# Patient Record
Sex: Female | Born: 1965
Health system: Southern US, Community
[De-identification: ages and names within clinical notes are randomized; demographics above are authoritative.]

## PROBLEM LIST (undated history)

## (undated) DIAGNOSIS — I10 Essential (primary) hypertension: Secondary | ICD-10-CM

## (undated) DIAGNOSIS — R7303 Prediabetes: Secondary | ICD-10-CM

## (undated) DIAGNOSIS — K5792 Diverticulitis of intestine, part unspecified, without perforation or abscess without bleeding: Secondary | ICD-10-CM

## (undated) HISTORY — DX: Essential (primary) hypertension: I10

---

## 1986-07-23 HISTORY — PX: CHOLECYSTECTOMY: SHX55

## 1995-07-24 HISTORY — PX: HAND SURGERY: SHX662

## 2000-11-11 ENCOUNTER — Encounter: Payer: Self-pay | Admitting: Emergency Medicine

## 2000-11-11 ENCOUNTER — Emergency Department (HOSPITAL_COMMUNITY): Admission: EM | Admit: 2000-11-11 | Discharge: 2000-11-11 | Payer: Self-pay | Admitting: Emergency Medicine

## 2001-01-01 ENCOUNTER — Emergency Department (HOSPITAL_COMMUNITY): Admission: EM | Admit: 2001-01-01 | Discharge: 2001-01-01 | Payer: Self-pay | Admitting: *Deleted

## 2001-01-01 ENCOUNTER — Encounter: Payer: Self-pay | Admitting: *Deleted

## 2001-01-05 ENCOUNTER — Emergency Department (HOSPITAL_COMMUNITY): Admission: EM | Admit: 2001-01-05 | Discharge: 2001-01-05 | Payer: Self-pay | Admitting: *Deleted

## 2001-02-02 ENCOUNTER — Emergency Department (HOSPITAL_COMMUNITY): Admission: EM | Admit: 2001-02-02 | Discharge: 2001-02-02 | Payer: Self-pay | Admitting: Emergency Medicine

## 2001-03-27 ENCOUNTER — Encounter: Payer: Self-pay | Admitting: Emergency Medicine

## 2001-03-27 ENCOUNTER — Emergency Department (HOSPITAL_COMMUNITY): Admission: EM | Admit: 2001-03-27 | Discharge: 2001-03-27 | Payer: Self-pay | Admitting: Emergency Medicine

## 2001-05-01 ENCOUNTER — Emergency Department (HOSPITAL_COMMUNITY): Admission: EM | Admit: 2001-05-01 | Discharge: 2001-05-01 | Payer: Self-pay | Admitting: Emergency Medicine

## 2001-05-02 ENCOUNTER — Encounter: Payer: Self-pay | Admitting: Emergency Medicine

## 2001-05-02 ENCOUNTER — Emergency Department (HOSPITAL_COMMUNITY): Admission: EM | Admit: 2001-05-02 | Discharge: 2001-05-02 | Payer: Self-pay | Admitting: Emergency Medicine

## 2001-06-18 ENCOUNTER — Other Ambulatory Visit: Admission: RE | Admit: 2001-06-18 | Discharge: 2001-06-18 | Payer: Self-pay | Admitting: Obstetrics and Gynecology

## 2001-06-20 ENCOUNTER — Ambulatory Visit (HOSPITAL_COMMUNITY): Admission: RE | Admit: 2001-06-20 | Discharge: 2001-06-20 | Payer: Self-pay | Admitting: Family Medicine

## 2001-06-20 ENCOUNTER — Encounter: Payer: Self-pay | Admitting: Family Medicine

## 2001-06-27 ENCOUNTER — Emergency Department (HOSPITAL_COMMUNITY): Admission: EM | Admit: 2001-06-27 | Discharge: 2001-06-27 | Payer: Self-pay | Admitting: Emergency Medicine

## 2001-08-30 ENCOUNTER — Emergency Department (HOSPITAL_COMMUNITY): Admission: EM | Admit: 2001-08-30 | Discharge: 2001-08-30 | Payer: Self-pay | Admitting: Internal Medicine

## 2001-12-09 ENCOUNTER — Emergency Department (HOSPITAL_COMMUNITY): Admission: EM | Admit: 2001-12-09 | Discharge: 2001-12-09 | Payer: Self-pay | Admitting: Emergency Medicine

## 2002-07-02 ENCOUNTER — Emergency Department (HOSPITAL_COMMUNITY): Admission: EM | Admit: 2002-07-02 | Discharge: 2002-07-02 | Payer: Self-pay | Admitting: Internal Medicine

## 2002-07-17 ENCOUNTER — Emergency Department (HOSPITAL_COMMUNITY): Admission: EM | Admit: 2002-07-17 | Discharge: 2002-07-17 | Payer: Self-pay | Admitting: Emergency Medicine

## 2002-07-17 ENCOUNTER — Encounter: Payer: Self-pay | Admitting: Emergency Medicine

## 2002-11-30 ENCOUNTER — Emergency Department (HOSPITAL_COMMUNITY): Admission: EM | Admit: 2002-11-30 | Discharge: 2002-12-01 | Payer: Self-pay | Admitting: *Deleted

## 2003-11-10 ENCOUNTER — Emergency Department (HOSPITAL_COMMUNITY): Admission: EM | Admit: 2003-11-10 | Discharge: 2003-11-10 | Payer: Self-pay | Admitting: Emergency Medicine

## 2006-02-11 ENCOUNTER — Emergency Department (HOSPITAL_COMMUNITY): Admission: EM | Admit: 2006-02-11 | Discharge: 2006-02-12 | Payer: Self-pay | Admitting: Emergency Medicine

## 2007-12-06 ENCOUNTER — Emergency Department (HOSPITAL_COMMUNITY): Admission: EM | Admit: 2007-12-06 | Discharge: 2007-12-06 | Payer: Self-pay | Admitting: Emergency Medicine

## 2007-12-19 ENCOUNTER — Ambulatory Visit (HOSPITAL_COMMUNITY): Admission: RE | Admit: 2007-12-19 | Discharge: 2007-12-19 | Payer: Self-pay | Admitting: Obstetrics and Gynecology

## 2009-05-18 ENCOUNTER — Emergency Department (HOSPITAL_COMMUNITY): Admission: EM | Admit: 2009-05-18 | Discharge: 2009-05-18 | Payer: Self-pay | Admitting: Emergency Medicine

## 2009-12-22 ENCOUNTER — Emergency Department (HOSPITAL_COMMUNITY): Admission: EM | Admit: 2009-12-22 | Discharge: 2009-12-22 | Payer: Self-pay | Admitting: Emergency Medicine

## 2010-10-26 LAB — URINE MICROSCOPIC-ADD ON

## 2010-10-26 LAB — URINALYSIS, ROUTINE W REFLEX MICROSCOPIC
Bilirubin Urine: NEGATIVE
Ketones, ur: NEGATIVE mg/dL
Protein, ur: NEGATIVE mg/dL
Specific Gravity, Urine: 1.015 (ref 1.005–1.030)
Urobilinogen, UA: 0.2 mg/dL (ref 0.0–1.0)
pH: 5.5 (ref 5.0–8.0)

## 2010-10-26 LAB — PREGNANCY, URINE: Preg Test, Ur: NEGATIVE

## 2010-11-20 ENCOUNTER — Emergency Department (HOSPITAL_COMMUNITY): Payer: BC Managed Care – PPO

## 2010-11-20 ENCOUNTER — Emergency Department (HOSPITAL_COMMUNITY)
Admission: EM | Admit: 2010-11-20 | Discharge: 2010-11-20 | Disposition: A | Discharge status: Home / Self Care | Payer: BC Managed Care – PPO | Attending: Emergency Medicine | Admitting: Emergency Medicine

## 2010-11-20 DIAGNOSIS — N898 Other specified noninflammatory disorders of vagina: Secondary | ICD-10-CM | POA: Insufficient documentation

## 2010-11-20 LAB — URINE MICROSCOPIC-ADD ON

## 2010-11-20 LAB — URINALYSIS, ROUTINE W REFLEX MICROSCOPIC
Ketones, ur: NEGATIVE mg/dL
Nitrite: NEGATIVE
Protein, ur: NEGATIVE mg/dL
Specific Gravity, Urine: 1.025 (ref 1.005–1.030)

## 2010-11-20 LAB — WET PREP, GENITAL
Trich, Wet Prep: NONE SEEN
Yeast Wet Prep HPF POC: NONE SEEN

## 2010-11-20 LAB — RPR: RPR Ser Ql: NONREACTIVE

## 2010-11-21 LAB — GC/CHLAMYDIA PROBE AMP, GENITAL
Chlamydia, DNA Probe: NEGATIVE
GC Probe Amp, Genital: NEGATIVE

## 2011-04-18 LAB — DIFFERENTIAL
Basophils Absolute: 0
Basophils Relative: 0
Eosinophils Relative: 1
Lymphocytes Relative: 21
Lymphs Abs: 1.7

## 2011-04-18 LAB — CBC
HCT: 31.8 — ABNORMAL LOW
MCHC: 33.1
MCV: 67 — ABNORMAL LOW
Platelets: 268
RBC: 4.75
WBC: 7.8

## 2011-04-18 LAB — PREGNANCY, URINE: Preg Test, Ur: NEGATIVE

## 2012-07-24 ENCOUNTER — Emergency Department (HOSPITAL_COMMUNITY)
Admission: EM | Admit: 2012-07-24 | Discharge: 2012-07-25 | Disposition: A | Discharge status: Home / Self Care | Payer: Self-pay | Attending: Emergency Medicine | Admitting: Emergency Medicine

## 2012-07-24 ENCOUNTER — Encounter (HOSPITAL_COMMUNITY): Payer: Self-pay

## 2012-07-24 DIAGNOSIS — K612 Anorectal abscess: Secondary | ICD-10-CM | POA: Insufficient documentation

## 2012-07-24 DIAGNOSIS — Z9089 Acquired absence of other organs: Secondary | ICD-10-CM | POA: Insufficient documentation

## 2012-07-24 DIAGNOSIS — L0291 Cutaneous abscess, unspecified: Secondary | ICD-10-CM

## 2012-07-24 DIAGNOSIS — F17200 Nicotine dependence, unspecified, uncomplicated: Secondary | ICD-10-CM | POA: Insufficient documentation

## 2012-07-24 DIAGNOSIS — L0390 Cellulitis, unspecified: Secondary | ICD-10-CM

## 2012-07-24 MED ORDER — OXYCODONE-ACETAMINOPHEN 5-325 MG PO TABS: 2.0000 | ORAL_TABLET | ORAL | Status: DC | PRN

## 2012-07-24 MED ORDER — SULFAMETHOXAZOLE-TRIMETHOPRIM 800-160 MG PO TABS: 1.0000 | ORAL_TABLET | Freq: Two times a day (BID) | ORAL | Status: DC

## 2012-07-24 MED ORDER — LIDOCAINE HCL 2 % IJ SOLN
10.0000 mL | Freq: Once | INTRAMUSCULAR | Status: AC
  Administered 2012-07-24: 200 mg via INTRADERMAL

## 2012-07-24 NOTE — ED Notes (Signed)
Pt A.O. X 4. Respirations even and regular. NAD. Verbalized understanding of diagnosis. Verbalized need to have packing removed in 2-3 days by PCP or return to ED. Verbalized understanding of medications and need to complete full dose of antibiotic. No further questions at this time. Ambulatory with no assist. Family at bedside.

## 2012-07-24 NOTE — ED Provider Notes (Addendum)
History     CSN: [id number]  Arrival date & time 07/24/12  2021   First MD Initiated Contact with Patient 07/24/12 2105      Chief Complaint  Patient presents with  . Abscess    (Consider location/radiation/quality/duration/timing/severity/associated sxs/prior treatment) Patient is a 47 y.o. female presenting with abscess. The history is provided by the patient and a friend. No language interpreter was used.  Abscess  This is a new problem. The current episode started less than one week ago. The onset was gradual. The problem occurs rarely. The problem has been gradually worsening. Affected Location: R perirectal. The problem is moderate. The abscess is characterized by redness, painfulness and swelling. Pertinent negatives include no fever, no vomiting and no sore throat.  47yo female with c/o boil on her R buttocks x 2 days.  Pain with standing and sitting.  Nothing makes the pan better.  Denies fever, nausea and vomiting.   History reviewed. No pertinent past medical history.  Past Surgical History  Procedure Date  . Cholecystectomy   . Hand surgery     History reviewed. No pertinent family history.  History  Substance Use Topics  . Smoking status: Current Some Day Smoker  . Smokeless tobacco: Not on file  . Alcohol Use: No    OB History    Grav Para Term Preterm Abortions TAB SAB Ect Mult Living                  Review of Systems  Constitutional: Negative for fever and diaphoresis.  HENT: Negative for sore throat.   Respiratory: Negative for shortness of breath.   Gastrointestinal: Negative for nausea and vomiting.  Skin:       Abscess to R buttocks  Neurological: Negative for dizziness, weakness and light-headedness.    Allergies  Ceftin  Home Medications   Current Outpatient Rx  Name  Route  Sig  Dispense  Refill  . IBUPROFEN 800 MG PO TABS   Oral   Take 800 mg by mouth once as needed. For pain           BP 126/69  Pulse 102  Temp 98.5 F  (36.9 C) (Oral)  Resp 16  SpO2 100%  LMP 07/14/2012  Physical Exam  Nursing note and vitals reviewed. Constitutional: She is oriented to person, place, and time. She appears well-developed and well-nourished.  HENT:  Head: Normocephalic and atraumatic.  Eyes: Conjunctivae and EOM are normal. Pupils are equal, round, and reactive to light.  Neck: Normal range of motion. Neck supple.  Cardiovascular: Normal rate.   Pulmonary/Chest: Effort normal.  Abdominal: Soft.  Musculoskeletal: Normal range of motion. She exhibits no edema and no tenderness.  Neurological: She is alert and oriented to person, place, and time. She has normal reflexes.  Skin: Skin is warm and dry.  Abscess to buttocks  Psychiatric: She has a normal mood and affect.    ED Course  INCISION AND DRAINAGE Date/Time: 07/24/2012 11:20 AM Performed by: Remi Haggard Authorized by: Remi Haggard Consent: Verbal consent obtained. Risks and benefits: risks, benefits and alternatives were discussed Consent given by: patient Patient understanding: patient states understanding of the procedure being performed Patient identity confirmed: verbally with patient, arm band, provided demographic data and hospital-assigned identification number Time out: Immediately prior to procedure a "time out" was called to verify the correct patient, procedure, equipment, support staff and site/side marked as required. Type: abscess Body area: trunk (peri rectal) Anesthesia: local infiltration Local anesthetic: lidocaine  2% without epinephrine Patient sedated: no Scalpel size: 11 Needle gauge: 20 Incision type: single straight Complexity: simple Drainage: purulent Drainage amount: moderate Wound treatment: wound left open Packing material: 1/2 in gauze Patient tolerance: Patient tolerated the procedure well with no immediate complications.   (including critical care time)  Labs Reviewed - No data to display No results  found.   No diagnosis found.    MDM  Abscess /cellulitis to peri rectal area I & D in the ER with packing.  Patient with return in 2 days for packing removal.  rx for bactim.  She understands to return sooner for worsening symptoms.  Ready for discharge.         Remi Haggard, NP 07/25/12 2952  Remi Haggard, NP 08/07/12 1356  Remi Haggard, NP 09/11/12 1136

## 2012-07-24 NOTE — ED Notes (Signed)
I&D Kit placed at bedside and lidocaine given to Remi Haggard, NP per request.

## 2012-07-24 NOTE — ED Notes (Signed)
Pt states noticed abscess the day before yesterday and she put warm cloth on it and states abscess increased in size after applying cloth; pt c/o pain when sitting; pt denies nausea/vomiting. Pt c/o chills. Pt denies fever. Pt mentating appropriately.

## 2012-07-24 NOTE — ED Notes (Signed)
Christina Haggard, NP at bedside with pt and I&D Kit

## 2012-07-24 NOTE — ED Notes (Signed)
Pt reports a "boil" to her buttocks, pain w/walking or sitting

## 2012-07-25 NOTE — ED Provider Notes (Signed)
Medical screening examination/treatment/procedure(s) were performed by non-physician practitioner and as supervising physician I was immediately available for consultation/collaboration.   Dione Booze, MD 07/25/12 [phone]

## 2012-09-17 ENCOUNTER — Telehealth (HOSPITAL_COMMUNITY): Payer: Self-pay | Admitting: Emergency Medicine

## 2012-09-19 ENCOUNTER — Telehealth (HOSPITAL_COMMUNITY): Payer: Self-pay | Admitting: Emergency Medicine

## 2015-08-02 ENCOUNTER — Other Ambulatory Visit: Payer: Self-pay | Admitting: Obstetrics and Gynecology

## 2015-08-10 ENCOUNTER — Ambulatory Visit (INDEPENDENT_AMBULATORY_CARE_PROVIDER_SITE_OTHER): Payer: BLUE CROSS/BLUE SHIELD | Admitting: Obstetrics and Gynecology

## 2015-08-10 ENCOUNTER — Other Ambulatory Visit (HOSPITAL_COMMUNITY)
Admission: RE | Admit: 2015-08-10 | Discharge: 2015-08-10 | Disposition: A | Discharge status: Home / Self Care | Payer: BLUE CROSS/BLUE SHIELD | Source: Ambulatory Visit | Attending: Obstetrics and Gynecology | Admitting: Obstetrics and Gynecology

## 2015-08-10 ENCOUNTER — Encounter: Payer: Self-pay | Admitting: Obstetrics and Gynecology

## 2015-08-10 VITALS — BP 118/76 | Ht 67.0 in | Wt 210.0 lb

## 2015-08-10 DIAGNOSIS — N9089 Other specified noninflammatory disorders of vulva and perineum: Secondary | ICD-10-CM | POA: Insufficient documentation

## 2015-08-10 DIAGNOSIS — Z01419 Encounter for gynecological examination (general) (routine) without abnormal findings: Secondary | ICD-10-CM | POA: Diagnosis not present

## 2015-08-10 DIAGNOSIS — Z0000 Encounter for general adult medical examination without abnormal findings: Secondary | ICD-10-CM | POA: Insufficient documentation

## 2015-08-10 DIAGNOSIS — Z1151 Encounter for screening for human papillomavirus (HPV): Secondary | ICD-10-CM | POA: Diagnosis not present

## 2015-08-10 DIAGNOSIS — Z113 Encounter for screening for infections with a predominantly sexual mode of transmission: Secondary | ICD-10-CM | POA: Diagnosis present

## 2015-08-10 DIAGNOSIS — N390 Urinary tract infection, site not specified: Secondary | ICD-10-CM | POA: Insufficient documentation

## 2015-08-10 MED ORDER — SOLIFENACIN SUCCINATE 5 MG PO TABS: 5.0000 mg | ORAL_TABLET | Freq: Every day | ORAL | Status: DC

## 2015-08-10 MED ORDER — SULFAMETHOXAZOLE-TRIMETHOPRIM 800-160 MG PO TABS: 1.0000 | ORAL_TABLET | Freq: Two times a day (BID) | ORAL | Status: DC

## 2015-08-10 MED ORDER — SULFAMETHOXAZOLE-TRIMETHOPRIM 400-80 MG PO TABS
1.0000 | ORAL_TABLET | Freq: Two times a day (BID) | ORAL | Status: DC
Start: 2015-08-10 — End: 2015-08-30

## 2015-08-10 NOTE — Progress Notes (Signed)
Patient ID: Christina Cannon, female   DOB: 03-05-1966, 50 y.o.   MRN: [id number]  Assessment:  Annual Gyn Exam , first in 10+ yrs. VULVAR acrochordon, right labia majora. Urinary urgency Multiple skin tags neck and chest Plan:  1. pap smear done, next pap due q 68yr 2. return 1 month for removal skin tag. or prn 3    Annual mammogram advised Trial vesicare.5 mg q d Subjective:  Christina Cannon is a 50 y.o. female No obstetric history on file. who presents for annual exam. Patient's last menstrual period was 07/20/2015. last pap OVER 10 YEARS. The patient has complaints today of as above, per Christina Cannon. See her note  The following portions of the patient's history were reviewed and updated as appropriate: allergies, current medications, past family history, past medical history, past social history, past surgical history and problem list. History reviewed. No pertinent past medical history.  Past Surgical History  Procedure Laterality Date  . Cholecystectomy    . Hand surgery      No current outpatient prescriptions on file.  Review of Systems Constitutional: negative Gastrointestinal: negative Genitourinary: urinary urgency  Objective:  BP 118/76 mmHg  Ht 5\' 7"  (1.702 m)  Wt 210 lb (95.255 kg)  BMI 32.88 kg/m2  LMP 07/20/2015   BMI: Body mass index is 32.88 kg/(m^2).  General Appearance: Alert, appropriate appearance for age. No acute distress HEENT: Grossly normal Neck / Thyroid:  Cardiovascular: RRR; normal S1, S2, no murmur Lungs: CTA bilaterally Back: No CVAT Breast Exam: No masses or nodes.No dimpling, nipple retraction or discharge. Gastrointestinal: Soft, non-tender, no masses or organomegaly Pelvic Exam: Vulva and vagina appear normal. Bimanual exam reveals normal uterus and adnexa. External genitalia: normal general appearance and skin tag right labia, 1 cm x 8 mm on stalk Urinary system: urethral meatus normal Vaginal: normal mucosa without prolapse  or lesions Cervix: normal appearance Adnexa: normal bimanual exam Uterus: mid-position, enlarged and irregular enlargement Rectal: good sphincter tone, no masses and guaiac negative Clinical staff offered to be present for exam: yes  Initials: amt Rectovaginal: normal rectal, no masses Lymphatic Exam: Non-palpable nodes in neck, clavicular, axillary, or inguinal regions Skin: no rash or abnormalities Neurologic: Normal gait and speech, no tremor  Psychiatric: Alert and oriented, appropriate affect.  Urinalysis:normal  Christina Cannon. MD Pgr [phone] 3:23 PM

## 2015-08-10 NOTE — Addendum Note (Signed)
Addended by: Farley Ly on: 08/10/2015 03:56 PM   Modules accepted: Orders

## 2015-08-10 NOTE — Addendum Note (Signed)
Addended by: [name] on: 08/10/2015 04:01 PM   Modules accepted: Orders

## 2015-08-10 NOTE — Progress Notes (Signed)
Patient ID: Christina Cannon, female   DOB: 04-22-1966, 50 y.o.   MRN: [id number] Pt here today for annual exam. Pt states that she has a skin tag on her " lip" that keeps catching on her clothes, lower abdominal pain , pain with sex at times, multiple periods each month.

## 2015-08-12 LAB — URINE CULTURE

## 2015-08-12 LAB — CYTOLOGY - PAP

## 2015-08-29 ENCOUNTER — Telehealth: Payer: Self-pay | Admitting: *Deleted

## 2015-08-29 NOTE — Telephone Encounter (Signed)
-----   Message from [name], MD sent at 08/28/2015  7:50 AM EST ----- Uti, will rx for septra ds x 7 days, and have pt called

## 2015-08-29 NOTE — Telephone Encounter (Signed)
Pt aware of antibiotic and UTI, pt stated that she has been taking the medication.

## 2015-08-30 ENCOUNTER — Encounter: Payer: Self-pay | Admitting: Obstetrics and Gynecology

## 2015-08-30 ENCOUNTER — Ambulatory Visit (INDEPENDENT_AMBULATORY_CARE_PROVIDER_SITE_OTHER): Payer: BLUE CROSS/BLUE SHIELD | Admitting: Obstetrics and Gynecology

## 2015-08-30 VITALS — BP 120/76 | Ht 67.0 in | Wt 207.0 lb

## 2015-08-30 DIAGNOSIS — L918 Other hypertrophic disorders of the skin: Secondary | ICD-10-CM

## 2015-08-30 DIAGNOSIS — N907 Vulvar cyst: Secondary | ICD-10-CM | POA: Diagnosis not present

## 2015-08-30 DIAGNOSIS — N9089 Other specified noninflammatory disorders of vulva and perineum: Secondary | ICD-10-CM

## 2015-08-30 NOTE — Progress Notes (Signed)
Patient ID: KEELIA CASTLEBERRY, female   DOB: 1966-01-23, 50 y.o.   MRN: [id number] SKIN TAG REMOVAL PROCEDURE NOTE The patient's identification was confirmed and consent was obtained. This procedure was performed by [name] at 3:48 PM Site & Appearance: right labia majora Sterile procedures observed yes Anesthetic used: local lidocaine AgNO3 applied  yes Skin tag excised under local anesthesia, site covered with dry, sterile dressing.  Patient tolerated procedure well without complications. Minimal blood loss. Instructions for care discussed verbally  additional written instructions for homecare and f/u.  SKIN TAG REMOVAL PROCEDURE NOTE The patient's identification was confirmed and consent was obtained. This procedure was performed by [name] at 3:48 PM Site & Appearance: neck, multiple, 5 removed Sterile procedures observed yes Anesthetic used: local 1% lidocaine AgNO3 applied no Skin tag excised under local anesthesia, site covered with dry, sterile dressing.  Patient tolerated procedure well without complications. Minimal blood loss. Instructions for care discussed verbally. additional written instructions for homecare and f/u.

## 2015-08-30 NOTE — Progress Notes (Signed)
Patient ID: Christina Cannon, female   DOB: [birth year], 50 y.o.   MRN: [id number] Pt here today for skin tag removal.

## 2015-09-19 ENCOUNTER — Emergency Department (HOSPITAL_COMMUNITY)
Admission: EM | Admit: 2015-09-19 | Discharge: 2015-09-19 | Disposition: A | Discharge status: Home / Self Care | Payer: BLUE CROSS/BLUE SHIELD | Attending: Emergency Medicine | Admitting: Emergency Medicine

## 2015-09-19 ENCOUNTER — Emergency Department (HOSPITAL_COMMUNITY): Payer: BLUE CROSS/BLUE SHIELD

## 2015-09-19 ENCOUNTER — Encounter (HOSPITAL_COMMUNITY): Payer: Self-pay | Admitting: *Deleted

## 2015-09-19 DIAGNOSIS — R200 Anesthesia of skin: Secondary | ICD-10-CM | POA: Diagnosis not present

## 2015-09-19 DIAGNOSIS — F17200 Nicotine dependence, unspecified, uncomplicated: Secondary | ICD-10-CM | POA: Insufficient documentation

## 2015-09-19 DIAGNOSIS — F1210 Cannabis abuse, uncomplicated: Secondary | ICD-10-CM | POA: Insufficient documentation

## 2015-09-19 DIAGNOSIS — R531 Weakness: Secondary | ICD-10-CM | POA: Insufficient documentation

## 2015-09-19 DIAGNOSIS — Z79899 Other long term (current) drug therapy: Secondary | ICD-10-CM | POA: Insufficient documentation

## 2015-09-19 DIAGNOSIS — M5412 Radiculopathy, cervical region: Secondary | ICD-10-CM | POA: Diagnosis not present

## 2015-09-19 DIAGNOSIS — M79601 Pain in right arm: Secondary | ICD-10-CM | POA: Diagnosis not present

## 2015-09-19 DIAGNOSIS — M542 Cervicalgia: Secondary | ICD-10-CM | POA: Diagnosis present

## 2015-09-19 LAB — COMPREHENSIVE METABOLIC PANEL
ALBUMIN: 3.1 g/dL — AB (ref 3.5–5.0)
ALK PHOS: 46 U/L (ref 38–126)
ALT: 9 U/L — AB (ref 14–54)
AST: 16 U/L (ref 15–41)
Anion gap: 9 (ref 5–15)
BUN: 7 mg/dL (ref 6–20)
CHLORIDE: 109 mmol/L (ref 101–111)
CO2: 21 mmol/L — AB (ref 22–32)
Calcium: 8.5 mg/dL — ABNORMAL LOW (ref 8.9–10.3)
Creatinine, Ser: 0.88 mg/dL (ref 0.44–1.00)
GFR calc non Af Amer: >60 mL/min (ref >60)
Glucose, Bld: 89 mg/dL (ref 65–99)
POTASSIUM: 3.8 mmol/L (ref 3.5–5.1)
SODIUM: 139 mmol/L (ref 135–145)
Total Bilirubin: <0.1 mg/dL — ABNORMAL LOW (ref 0.3–1.2)
Total Protein: 6.3 g/dL — ABNORMAL LOW (ref 6.5–8.1)

## 2015-09-19 LAB — RAPID URINE DRUG SCREEN, HOSP PERFORMED
AMPHETAMINES: NOT DETECTED
BENZODIAZEPINES: NOT DETECTED
Barbiturates: NOT DETECTED
COCAINE: NOT DETECTED
OPIATES: NOT DETECTED
Tetrahydrocannabinol: POSITIVE — AB

## 2015-09-19 LAB — DIFFERENTIAL
Basophils Absolute: 0 10*3/uL (ref 0.0–0.1)
Basophils Relative: 0 %
Eosinophils Absolute: 0.1 10*3/uL (ref 0.0–0.7)
Eosinophils Relative: 2 %
Lymphocytes Relative: 22 %
Lymphs Abs: 1.8 10*3/uL (ref 0.7–4.0)
MONO ABS: 0.4 10*3/uL (ref 0.1–1.0)
Monocytes Relative: 6 %
Neutro Abs: 5.5 10*3/uL (ref 1.7–7.7)
Neutrophils Relative %: 70 %

## 2015-09-19 LAB — URINALYSIS, ROUTINE W REFLEX MICROSCOPIC
BILIRUBIN URINE: NEGATIVE
Glucose, UA: NEGATIVE mg/dL
Ketones, ur: NEGATIVE mg/dL
Leukocytes, UA: NEGATIVE
Nitrite: NEGATIVE
PH: 5.5 (ref 5.0–8.0)
Protein, ur: NEGATIVE mg/dL
SPECIFIC GRAVITY, URINE: 1.006 (ref 1.005–1.030)

## 2015-09-19 LAB — CBC
HEMATOCRIT: 32.6 % — AB (ref 36.0–46.0)
Hemoglobin: 10.3 g/dL — ABNORMAL LOW (ref 12.0–15.0)
MCH: 23.1 pg — ABNORMAL LOW (ref 26.0–34.0)
MCHC: 31.6 g/dL (ref 30.0–36.0)
MCV: 73.1 fL — AB (ref 78.0–100.0)
PLATELETS: 236 10*3/uL (ref 150–400)
RBC: 4.46 MIL/uL (ref 3.87–5.11)
RDW: 17.3 % — ABNORMAL HIGH (ref 11.5–15.5)
WBC: 7.8 10*3/uL (ref 4.0–10.5)

## 2015-09-19 LAB — I-STAT CHEM 8, ED
BUN: 6 mg/dL (ref 6–20)
CALCIUM ION: 1.1 mmol/L — AB (ref 1.12–1.23)
CHLORIDE: 111 mmol/L (ref 101–111)
CREATININE: 0.9 mg/dL (ref 0.44–1.00)
GLUCOSE: 87 mg/dL (ref 65–99)
HCT: 34 % — ABNORMAL LOW (ref 36.0–46.0)
Hemoglobin: 11.6 g/dL — ABNORMAL LOW (ref 12.0–15.0)
Potassium: 3.8 mmol/L (ref 3.5–5.1)
Sodium: 141 mmol/L (ref 135–145)
TCO2: 21 mmol/L (ref 0–100)

## 2015-09-19 LAB — APTT: aPTT: 28 seconds (ref 24–37)

## 2015-09-19 LAB — PROTIME-INR
INR: 1.04 (ref 0.00–1.49)
PROTHROMBIN TIME: 13.8 s (ref 11.6–15.2)

## 2015-09-19 LAB — URINE MICROSCOPIC-ADD ON

## 2015-09-19 LAB — I-STAT TROPONIN, ED: TROPONIN I, POC: 0 ng/mL (ref 0.00–0.08)

## 2015-09-19 LAB — ETHANOL: Alcohol, Ethyl (B): <5 mg/dL (ref <5)

## 2015-09-19 MED ORDER — LORAZEPAM 2 MG/ML IJ SOLN
1.0000 mg | Freq: Once | INTRAMUSCULAR | Status: AC
  Administered 2015-09-19: 1 mg via INTRAVENOUS
  Filled 2015-09-19: qty 1

## 2015-09-19 MED ORDER — TRAMADOL HCL 50 MG PO TABS: 50.0000 mg | ORAL_TABLET | Freq: Four times a day (QID) | ORAL | Status: DC | PRN

## 2015-09-19 NOTE — ED Notes (Signed)
Patient transported to CT 

## 2015-09-19 NOTE — ED Provider Notes (Signed)
CSN: [id number]     Arrival date & time 09/19/15  [id number] History  By signing my name below, I, Helane Gunther, attest that this documentation has been prepared under the direction and in the presence of Orpah Greek, MD. Electronically Signed: Helane Gunther, ED Scribe. 09/19/2015. 3:49 AM.     Chief Complaint  Patient presents with  . Neck Pain  . Arm Pain   The history is provided by the patient. No language interpreter was used.  Marland Kitchen HPI Comments: Christina Cannon is a 50 y.o. female who presents to the Emergency Department complaining of constant, worsening right-sided neck and arm pain onset 2 days ago. Pt states she woke up with numbness to the entire right side 2 mornings ago. She reports associated right-sided weakness and stiffness, stating her right leg even gave out on heart one point. She notes exacerbation of the pain with movement. She reports a PMHx of hand surgery from which she still has screws in her hand.    History reviewed. No pertinent past medical history. Past Surgical History  Procedure Laterality Date  . Cholecystectomy    . Hand surgery     Family History  Problem Relation Age of Onset  . Diabetes Mother   . Hypertension Mother   . Diabetes Father   . Hypertension Father   . Heart failure Father    Social History  Substance Use Topics  . Smoking status: Current Some Day Smoker  . Smokeless tobacco: Never Used  . Alcohol Use: No   OB History    No data available     Review of Systems  Musculoskeletal: Positive for myalgias, neck pain and neck stiffness.  Neurological: Positive for weakness and numbness.  All other systems reviewed and are negative.   Allergies  Ceftin  Home Medications   Prior to Admission medications   Medication Sig Start Date End Date Taking? Authorizing Provider  solifenacin (VESICARE) 5 MG tablet Take 1 tablet (5 mg total) by mouth daily. 08/10/15   [name], MD   BP 134/76 mmHg  Pulse 76  Temp(Src)  97.6 F (36.4 C) (Oral)  Resp 16  SpO2 96% Physical Exam  Constitutional: She is oriented to person, place, and time. She appears well-developed and well-nourished. No distress.  HENT:  Head: Normocephalic and atraumatic.  Right Ear: Hearing normal.  Left Ear: Hearing normal.  Nose: Nose normal.  Mouth/Throat: Oropharynx is clear and moist and mucous membranes are normal.  Eyes: Conjunctivae and EOM are normal. Pupils are equal, round, and reactive to light.  Neck: Normal range of motion. Neck supple.  Cardiovascular: Regular rhythm, S1 normal and S2 normal.  Exam reveals no gallop and no friction rub.   No murmur heard. Pulmonary/Chest: Effort normal and breath sounds normal. No respiratory distress. She exhibits no tenderness.  Abdominal: Soft. Normal appearance and bowel sounds are normal. There is no hepatosplenomegaly. There is no tenderness. There is no rebound, no guarding, no tenderness at McBurney's point and negative Murphy's sign. No hernia.  Musculoskeletal: Normal range of motion.  Neurological: She is alert and oriented to person, place, and time. She has normal strength. No cranial nerve deficit or sensory deficit. Coordination normal. GCS eye subscore is 4. GCS verbal subscore is 5. GCS motor subscore is 6.  Skin: Skin is warm, dry and intact. No rash noted. No cyanosis.  Psychiatric: She has a normal mood and affect. Her speech is normal and behavior is normal. Thought content normal.  Nursing note and vitals reviewed.   ED Course  Procedures  DIAGNOSTIC STUDIES: Oxygen Saturation is 96% on RA, normal by my interpretation.    COORDINATION OF CARE: 3:45 AM - Discussed plans to order diagnostic studies and imaging to r/o stroke. Pt advised of plan for treatment and pt agrees.  Labs Review Labs Reviewed - No data to display  Imaging Review No results found. I have personally reviewed and evaluated these images and lab results as part of my medical  decision-making.   EKG Interpretation None      MDM   Final diagnoses:  None  Right-sided weakness  Patient presents to the ER for evaluation of right-sided weakness that has been present for greater than 24 hours. She reports that she woke up yesterday morning with numbness and weakness of her right arm and right leg. Examination did reveal some decreased grip strength and movement against gravity of the right side. This was concerning for possible subacute CVA, however, she also has pain in her right arm which is causing painful inhibition of movements.  Etiology of her symptoms is unclear at this time. Screening CT does not show any acute abnormality. Patient will undergo MRI to evaluate for the possibility of CVA. Case will be signed out to oncoming ER physician. If MRI shows evidence of stroke, will require hospitalization for treatment and workup. If MRI of brain is negative, symptoms likely secondary to cervical radiculopathy and musculoskeletal pain and would be appropriate for discharge.  I personally performed the services described in this documentation, which was scribed in my presence. The recorded information has been reviewed and is accurate.    Orpah Greek, MD 09/19/15 [phone]

## 2015-09-19 NOTE — ED Notes (Signed)
Patient transported to MRI 

## 2015-09-19 NOTE — Discharge Instructions (Signed)

## 2015-09-19 NOTE — ED Notes (Signed)
To ED for eval of right side stiffness and pain. Pain increases with movement. Started Friday.

## 2015-09-22 ENCOUNTER — Telehealth: Payer: Self-pay | Admitting: Neurology

## 2015-09-22 ENCOUNTER — Ambulatory Visit: Payer: BLUE CROSS/BLUE SHIELD | Admitting: Neurology

## 2015-09-22 NOTE — Telephone Encounter (Signed)
The patient came in for the appointment today, but left without being seen.

## 2015-09-24 ENCOUNTER — Encounter (HOSPITAL_COMMUNITY): Payer: Self-pay

## 2015-09-24 ENCOUNTER — Emergency Department (HOSPITAL_COMMUNITY)
Admission: EM | Admit: 2015-09-24 | Discharge: 2015-09-25 | Disposition: A | Discharge status: Home / Self Care | Payer: BLUE CROSS/BLUE SHIELD | Attending: Emergency Medicine | Admitting: Emergency Medicine

## 2015-09-24 DIAGNOSIS — R2689 Other abnormalities of gait and mobility: Secondary | ICD-10-CM | POA: Insufficient documentation

## 2015-09-24 DIAGNOSIS — R6883 Chills (without fever): Secondary | ICD-10-CM | POA: Diagnosis not present

## 2015-09-24 DIAGNOSIS — M542 Cervicalgia: Secondary | ICD-10-CM | POA: Diagnosis not present

## 2015-09-24 DIAGNOSIS — R51 Headache: Secondary | ICD-10-CM | POA: Insufficient documentation

## 2015-09-24 DIAGNOSIS — M79601 Pain in right arm: Secondary | ICD-10-CM | POA: Diagnosis not present

## 2015-09-24 DIAGNOSIS — H538 Other visual disturbances: Secondary | ICD-10-CM | POA: Insufficient documentation

## 2015-09-24 DIAGNOSIS — Z9049 Acquired absence of other specified parts of digestive tract: Secondary | ICD-10-CM | POA: Diagnosis not present

## 2015-09-24 DIAGNOSIS — Z7982 Long term (current) use of aspirin: Secondary | ICD-10-CM | POA: Insufficient documentation

## 2015-09-24 DIAGNOSIS — M79604 Pain in right leg: Secondary | ICD-10-CM | POA: Diagnosis not present

## 2015-09-24 DIAGNOSIS — R1030 Lower abdominal pain, unspecified: Secondary | ICD-10-CM | POA: Insufficient documentation

## 2015-09-24 DIAGNOSIS — R110 Nausea: Secondary | ICD-10-CM | POA: Insufficient documentation

## 2015-09-24 DIAGNOSIS — R200 Anesthesia of skin: Secondary | ICD-10-CM | POA: Insufficient documentation

## 2015-09-24 DIAGNOSIS — R109 Unspecified abdominal pain: Secondary | ICD-10-CM

## 2015-09-24 NOTE — ED Notes (Signed)
Pt seen on 09-19-15 for right neck and right arm pain, right sided leg weakness.  Pt still having pain and now pain on left leg.  Pt ambulated to triage room with no difficulty.  Onset yesterday, c/o lower abd pain.  No relief with Ibuprofen.

## 2015-09-24 NOTE — ED Provider Notes (Signed)
CSN: [id number]     Arrival date & time 09/24/15  2151 History  By signing my name below, I, Christina Cannon, attest that this documentation has been prepared under the direction and in the presence of [name], MD. Electronically Signed: Hansel Cannon, ED Scribe. 09/24/2015. 11:32 PM.    Chief Complaint  Patient presents with  . Arm Pain  . Leg Pain  . Abdominal Pain   The history is provided by the patient. No language interpreter was used.   HPI Comments: Christina Cannon is a 50 y.o. female with h/o hand surgery, cholecystectomy who presents to the Emergency Department complaining of moderate right arm pain, right leg pain, right-sided neck pain onset last week and worsened this evening to include left leg pain. Pt states associated intermittent right arm and leg numbness since last week. Pt was seen in the ED last week for the same right-sided symptoms and had negative head CT. Her MRI showed differential of early manifestation of small vessel disease versus MS. She notes her follow up with neurology has been rescheduled to the 14th of this month. She now also has complaints tonight of lower abdominal pain onset yesterday, HA, right-sided blurry vision, occasional balance disturbance, nausea, occasional chills. Pt states her abdominal pain does not seem to be related to her other pains. Pt states her pains are worsened with movement. She also reports the sensation that her legs "give out" on her. Pt denies recent fall, trauma, injury. No h/o similar pain, neck or back problems. Pt states she recently recovered from a UTI last week and was treated with vesicare. She denies fever, emesis, diarrhea, dysuria, hematuria, frequency.   History reviewed. No pertinent past medical history. Past Surgical History  Procedure Laterality Date  . Cholecystectomy    . Hand surgery     Family History  Problem Relation Age of Onset  . Diabetes Mother   . Hypertension Mother   . Diabetes Father   .  Hypertension Father   . Heart failure Father    Social History  Substance Use Topics  . Smoking status: Current Some Day Smoker -- 0.50 packs/day    Types: Cigarettes  . Smokeless tobacco: Never Used  . Alcohol Use: No   OB History    No data available     Review of Systems  Constitutional: Positive for chills. Negative for fever.  Eyes: Positive for visual disturbance.  Gastrointestinal: Positive for nausea and abdominal pain. Negative for vomiting and diarrhea.  Genitourinary: Negative for dysuria, frequency and hematuria.  Musculoskeletal: Positive for myalgias, arthralgias and neck pain.  Neurological: Positive for numbness and headaches.   Allergies  Ceftin  Home Medications   Prior to Admission medications   Medication Sig Start Date End Date Taking? Authorizing Provider  aspirin 325 MG EC tablet Take 650 mg by mouth every 6 (six) hours as needed for pain.   Yes Historical Provider, MD  naproxen sodium (ALL DAY PAIN RELIEF) 220 MG tablet Take 220 mg by mouth 2 (two) times daily as needed (FOR PAIN).   Yes Historical Provider, MD  solifenacin (VESICARE) 5 MG tablet Take 1 tablet (5 mg total) by mouth daily. Patient not taking: Reported on 09/19/2015 08/10/15   [name], MD  traMADol (ULTRAM) 50 MG tablet Take 1 tablet (50 mg total) by mouth every 6 (six) hours as needed. 09/19/15   [name], MD   BP 115/68 mmHg  Pulse 60  Temp(Src) 98.6 F (37 C) (Oral)  Resp 18  Ht 5\' 7"  (1.702 m)  Wt 206 lb 2 oz (93.498 kg)  BMI 32.28 kg/m2  SpO2 99%  LMP 09/16/2015 Physical Exam  Constitutional: She is oriented to person, place, and time. She appears well-developed and well-nourished.  HENT:  Head: Normocephalic and atraumatic.  Eyes: Conjunctivae and EOM are normal. Pupils are equal, round, and reactive to light.  Visual fields intact by confrontation.  Neck: Normal range of motion. Neck supple.  Cardiovascular: Normal rate, regular rhythm and normal heart sounds.   Exam reveals no gallop and no friction rub.   No murmur heard. Pulmonary/Chest: Effort normal and breath sounds normal. No respiratory distress. She has no wheezes. She has no rales.  Abdominal: Soft. Bowel sounds are normal. She exhibits no distension and no mass. There is tenderness. There is no rebound and no guarding.   Mild lower abdominal tenderness without rebound, guarding or mass.   Musculoskeletal: Normal range of motion. She exhibits tenderness.  Right cervical paraspinal/trapezius tenderness.  Neurological: She is alert and oriented to person, place, and time.  Face symmetric. Good grip strength bilaterally of upper extremities. Good flexion and extension of bilateral upper extremities and bilateral ankles. Negative SLR.  Skin: Skin is warm and dry.  Psychiatric: She has a normal mood and affect. Her behavior is normal.  Nursing note and vitals reviewed.   ED Course  Procedures (including critical care time) DIAGNOSTIC STUDIES: Oxygen Saturation is 99% on RA, normal by my interpretation.    COORDINATION OF CARE: 11:08 PM Discussed treatment plan with pt at bedside and pt agreed to plan.   Labs Review Labs Reviewed  COMPREHENSIVE METABOLIC PANEL - Abnormal; Notable for the following:    Creatinine, Ser 1.07 (*)    Calcium 8.7 (*)    Total Protein 6.4 (*)    Albumin 3.1 (*)    AST 14 (*)    ALT 9 (*)    GFR calc non Af Amer 59 (*)    All other components within normal limits  CBC WITH DIFFERENTIAL/PLATELET - Abnormal; Notable for the following:    Hemoglobin 10.0 (*)    HCT 31.5 (*)    MCV 72.7 (*)    MCH 23.1 (*)    RDW 17.2 (*)    All other components within normal limits  URINALYSIS, ROUTINE W REFLEX MICROSCOPIC (NOT AT [facility])    Imaging Review No results found. I have personally reviewed and evaluated these images and lab results as part of my medical decision-making.   EKG Interpretation None      MDM   Final diagnoses:  Abdominal pain, unspecified  abdominal location  Pain of right upper extremity    Patient with right-sided pain. Has been seen for the same and had MRI with some nonspecific changes. She was not able to afford the co-pay for MRI follow-up with neurology but has another one scheduled for 10 days from now. Also has abdominal pain. Has had some dysuria.   lab work overall reassuring. Creatinine mildly elevated will need to be followed. Will discharge home  I personally performed the services described in this documentation, which was scribed in my presence. The recorded information has been reviewed and is accurate.     [name], MD 09/25/15 [phone]

## 2015-09-25 LAB — CBC WITH DIFFERENTIAL/PLATELET
Basophils Absolute: 0 10*3/uL (ref 0.0–0.1)
Basophils Relative: 0 %
Eosinophils Absolute: 0.1 10*3/uL (ref 0.0–0.7)
Eosinophils Relative: 2 %
HCT: 31.5 % — ABNORMAL LOW (ref 36.0–46.0)
Hemoglobin: 10 g/dL — ABNORMAL LOW (ref 12.0–15.0)
LYMPHS ABS: 2.1 10*3/uL (ref 0.7–4.0)
LYMPHS PCT: 24 %
MCH: 23.1 pg — AB (ref 26.0–34.0)
MCHC: 31.7 g/dL (ref 30.0–36.0)
MCV: 72.7 fL — AB (ref 78.0–100.0)
MONO ABS: 0.6 10*3/uL (ref 0.1–1.0)
MONOS PCT: 7 %
Neutro Abs: 5.8 10*3/uL (ref 1.7–7.7)
Neutrophils Relative %: 67 %
PLATELETS: 242 10*3/uL (ref 150–400)
RBC: 4.33 MIL/uL (ref 3.87–5.11)
RDW: 17.2 % — AB (ref 11.5–15.5)
WBC: 8.6 10*3/uL (ref 4.0–10.5)

## 2015-09-25 LAB — URINALYSIS, ROUTINE W REFLEX MICROSCOPIC
Bilirubin Urine: NEGATIVE
GLUCOSE, UA: NEGATIVE mg/dL
HGB URINE DIPSTICK: NEGATIVE
KETONES UR: NEGATIVE mg/dL
Leukocytes, UA: NEGATIVE
Nitrite: NEGATIVE
PH: 6 (ref 5.0–8.0)
PROTEIN: NEGATIVE mg/dL
Specific Gravity, Urine: 1.007 (ref 1.005–1.030)

## 2015-09-25 LAB — COMPREHENSIVE METABOLIC PANEL
ALK PHOS: 52 U/L (ref 38–126)
ALT: 9 U/L — ABNORMAL LOW (ref 14–54)
AST: 14 U/L — ABNORMAL LOW (ref 15–41)
Albumin: 3.1 g/dL — ABNORMAL LOW (ref 3.5–5.0)
Anion gap: 11 (ref 5–15)
BUN: 8 mg/dL (ref 6–20)
CHLORIDE: 106 mmol/L (ref 101–111)
CO2: 23 mmol/L (ref 22–32)
Calcium: 8.7 mg/dL — ABNORMAL LOW (ref 8.9–10.3)
Creatinine, Ser: 1.07 mg/dL — ABNORMAL HIGH (ref 0.44–1.00)
GFR calc Af Amer: >60 mL/min (ref >60)
GFR calc non Af Amer: 59 mL/min — ABNORMAL LOW (ref >60)
GLUCOSE: 93 mg/dL (ref 65–99)
POTASSIUM: 3.5 mmol/L (ref 3.5–5.1)
SODIUM: 140 mmol/L (ref 135–145)
TOTAL PROTEIN: 6.4 g/dL — AB (ref 6.5–8.1)
Total Bilirubin: 0.4 mg/dL (ref 0.3–1.2)

## 2015-09-25 NOTE — Discharge Instructions (Signed)

## 2015-09-25 NOTE — ED Notes (Signed)
Patient ambulated to restroom with steady gait.

## 2015-09-25 NOTE — ED Notes (Signed)
Patient verbalized understanding of discharge instructions and denies any further needs or questions at this time. VS stable. Patient ambulatory with steady gait.  

## 2015-10-05 ENCOUNTER — Telehealth: Payer: Self-pay | Admitting: Neurology

## 2015-10-05 ENCOUNTER — Ambulatory Visit: Payer: BLUE CROSS/BLUE SHIELD | Admitting: Neurology

## 2015-10-05 NOTE — Telephone Encounter (Signed)
This patient did not show for a new patient appointment today. 

## 2015-10-06 ENCOUNTER — Encounter: Payer: Self-pay | Admitting: Neurology

## 2015-10-20 ENCOUNTER — Encounter (HOSPITAL_COMMUNITY): Payer: Self-pay

## 2015-10-20 ENCOUNTER — Emergency Department (HOSPITAL_COMMUNITY): Payer: BLUE CROSS/BLUE SHIELD

## 2015-10-20 ENCOUNTER — Emergency Department (HOSPITAL_COMMUNITY)
Admission: EM | Admit: 2015-10-20 | Discharge: 2015-10-20 | Disposition: A | Discharge status: Home / Self Care | Payer: BLUE CROSS/BLUE SHIELD | Attending: Emergency Medicine | Admitting: Emergency Medicine

## 2015-10-20 DIAGNOSIS — Z7982 Long term (current) use of aspirin: Secondary | ICD-10-CM | POA: Insufficient documentation

## 2015-10-20 DIAGNOSIS — M5020 Other cervical disc displacement, unspecified cervical region: Secondary | ICD-10-CM | POA: Diagnosis not present

## 2015-10-20 DIAGNOSIS — F17210 Nicotine dependence, cigarettes, uncomplicated: Secondary | ICD-10-CM | POA: Diagnosis not present

## 2015-10-20 DIAGNOSIS — M25511 Pain in right shoulder: Secondary | ICD-10-CM | POA: Diagnosis present

## 2015-10-20 DIAGNOSIS — R531 Weakness: Secondary | ICD-10-CM | POA: Insufficient documentation

## 2015-10-20 DIAGNOSIS — M5124 Other intervertebral disc displacement, thoracic region: Secondary | ICD-10-CM | POA: Diagnosis not present

## 2015-10-20 MED ORDER — PREDNISONE 20 MG PO TABS: ORAL_TABLET | ORAL | Status: DC

## 2015-10-20 NOTE — ED Notes (Signed)
Patient transported to MRI 

## 2015-10-20 NOTE — ED Notes (Signed)
Patient here with ongoing right shoulder pain and some ongoing right sided numbness. Her employer sent her here to be evaluated for going back to work. Denies trauma

## 2015-10-20 NOTE — ED Provider Notes (Signed)
CSN: [id number]     Arrival date & time 10/20/15  1523 History   First MD Initiated Contact with Patient 10/20/15 1812     Chief Complaint  Patient presents with  . Shoulder Pain     (Consider location/radiation/quality/duration/timing/severity/associated sxs/prior Treatment) HPI  50 year old female with a history of neck pain, right upper extremity and right lower extremity weakness. Also having pain in her shoulder. Patient states this is been on for about a month. Was seen here at the end of February and had a negative MRI when she was worked up for a stroke. Denies headache currently. States the neck pain is mostly right-sided. Having trouble with working due to some weakness in her shoulder and arm and thus her blur center here to be evaluated as to when she go back to work. No bowel/bladder incontinence.  History reviewed. No pertinent past medical history. Past Surgical History  Procedure Laterality Date  . Cholecystectomy    . Hand surgery     Family History  Problem Relation Age of Onset  . Diabetes Mother   . Hypertension Mother   . Diabetes Father   . Hypertension Father   . Heart failure Father    Social History  Substance Use Topics  . Smoking status: Current Some Day Smoker -- 0.50 packs/day    Types: Cigarettes  . Smokeless tobacco: Never Used  . Alcohol Use: No   OB History    No data available     Review of Systems  Respiratory: Negative for shortness of breath.   Cardiovascular: Negative for chest pain.  Gastrointestinal: Negative for vomiting and abdominal pain.  Musculoskeletal: Positive for neck pain. Negative for back pain.  Neurological: Positive for weakness and numbness. Negative for headaches.  All other systems reviewed and are negative.     Allergies  Ceftin  Home Medications   Prior to Admission medications   Medication Sig Start Date End Date Taking? Authorizing Provider  aspirin 325 MG EC tablet Take 650 mg by mouth every 6 (six)  hours as needed for pain.    Historical Provider, MD  naproxen sodium (ALL DAY PAIN RELIEF) 220 MG tablet Take 220 mg by mouth 2 (two) times daily as needed (FOR PAIN).    Historical Provider, MD  solifenacin (VESICARE) 5 MG tablet Take 1 tablet (5 mg total) by mouth daily. Patient not taking: Reported on 09/19/2015 08/10/15   [name], MD  traMADol (ULTRAM) 50 MG tablet Take 1 tablet (50 mg total) by mouth every 6 (six) hours as needed. 09/19/15   [name], MD   BP 129/88 mmHg  Pulse 70  Temp(Src) 99 F (37.2 C) (Oral)  Resp 18  Ht 5\' 7"  (1.702 m)  Wt 211 lb (95.709 kg)  BMI 33.04 kg/m2  SpO2 98%  LMP 09/16/2015 Physical Exam  Constitutional: She is oriented to person, place, and time. She appears well-developed and well-nourished.  HENT:  Head: Normocephalic and atraumatic.  Right Ear: External ear normal.  Left Ear: External ear normal.  Nose: Nose normal.  Eyes: Right eye exhibits no discharge. Left eye exhibits no discharge.  Neck: Normal range of motion. Neck supple. Muscular tenderness present. No rigidity.    Cardiovascular: Normal rate, regular rhythm and normal heart sounds.   Pulses:      Radial pulses are 2+ on the right side, and 2+ on the left side.  Pulmonary/Chest: Effort normal and breath sounds normal.  Abdominal: Soft. There is no tenderness.  Musculoskeletal:  Right shoulder with pain with ROM over shoulder and trapezius  Neurological: She is alert and oriented to person, place, and time.  Reflex Scores:      Bicep reflexes are 2+ on the right side and 2+ on the left side.      Patellar reflexes are 2+ on the right side and 2+ on the left side. 5/5 strength in all 4 extremities but RUE and RLE seem slightly weaker than left. Normal gross sensation  Skin: Skin is warm and dry.  Nursing note and vitals reviewed.   ED Course  Procedures (including critical care time) Labs Review Labs Reviewed - No data to display  Imaging Review Mr Cervical Spine  Wo Contrast  10/20/2015  CLINICAL DATA:  RIGHT shoulder pain. Ongoing right-sided numbness. Question of early small vessel disease versus demyelinating disease was raised on previous MR brain. EXAM: MRI CERVICAL SPINE WITHOUT CONTRAST TECHNIQUE: Multiplanar, multisequence MR imaging of the cervical spine was performed. No intravenous contrast was administered. COMPARISON:  MR brain 09/19/2015. FINDINGS: The patient was unable to remain motionless for the exam. Small or subtle lesions could be overlooked. Alignment: Reversal of the normal cervical lordotic curve. No subluxation. Vertebrae: No worrisome osseous lesion. Low signal intensity bone marrow on T1 weighted imaging likely related smoking. Cord: Multiple levels of cord compression, described below. No abnormal cord signal to suggest demyelinating disease. Posterior Fossa: No tonsillar herniation. Vertebral Arteries: BILATERAL patent.  LEFT dominant. Paraspinal tissues: Unremarkable. Disc levels: The individual disc spaces were examined as follows: C2-3:  Shallow protrusion.  No impingement. C3-4: Moderate disc space narrowing. Posteriorly projecting disc osteophyte complex with uncinate spurring. Mild canal stenosis with slight cord flattening. BILATERAL C4 nerve root impingement. Canal diameter 7-8 mm C4-5: Disc space narrowing. Central disc osteophyte complex, with leftward protrusion. BILATERAL uncinate spurring. Mild canal stenosis. Mild cord flattening with canal diameter 7-8 mm. RIGHT greater than LEFT C5 nerve root impingement. C5-6:  Mild bulge.  No impingement. C6-7: Central and leftward protrusion. Slight cephalad migration. Left-sided uncinate spurring. LEFT C7 nerve root impingement. C7-T1:  Normal. T1-T2: Large disc extrusion temporal and to the RIGHT. No cord compression. RIGHT T1 nerve root impingement likely. Incompletely evaluated due to motion and lack of axial imaging are large disc extrusions at T2-3 and T3-4. These potentially cause  significant thoracic spinal cord compression. 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 No abnormal cord signal. Right-sided neural impingement contributing to numbness or weakness could derive from foraminal narrowing due to disc and uncinate spurring at the C3-4 and C4-5 levels. Central and leftward protrusion at C6-7 likely affects the LEFT C7 nerve root. Large disc extrusion at T1-2 on the RIGHT, potentially affecting the RIGHT T1 nerve root but not compressing the cord. Incompletely evaluated due to motion, and lack of axial imaging, are large disc extrusions at T2-3 and T3-4. These appear to compress the cord. Electronically Signed   By: Staci Righter M.D.   On: 10/20/2015 21:23   I have personally reviewed and evaluated these images and lab results as part of my medical decision-making.   EKG Interpretation None      MDM   Final diagnoses:  Right sided weakness  Thoracic disc herniation  Cervical disc herniation    Patient's MRI reviewed with neurosurgery, Dr. Vertell Limber, who recommends steroids and f/u in his office Monday, April 3. No acute spinal cord emergency. MRI brain with same symptoms 1 month ago unremarkable, do not  feel she needs another now. Likely radicular pain from neck, unclear exactly why leg is weak (although she ambulates well). F/u with neurosurgery.    Sherwood Gambler, MD 10/20/15 2203

## 2016-01-09 ENCOUNTER — Encounter (HOSPITAL_COMMUNITY): Payer: Self-pay | Admitting: Emergency Medicine

## 2016-01-09 ENCOUNTER — Emergency Department (HOSPITAL_COMMUNITY): Payer: BLUE CROSS/BLUE SHIELD

## 2016-01-09 ENCOUNTER — Emergency Department (HOSPITAL_COMMUNITY)
Admission: EM | Admit: 2016-01-09 | Discharge: 2016-01-09 | Disposition: A | Discharge status: Home / Self Care | Payer: BLUE CROSS/BLUE SHIELD | Attending: Emergency Medicine | Admitting: Emergency Medicine

## 2016-01-09 DIAGNOSIS — Z7982 Long term (current) use of aspirin: Secondary | ICD-10-CM | POA: Insufficient documentation

## 2016-01-09 DIAGNOSIS — R29898 Other symptoms and signs involving the musculoskeletal system: Secondary | ICD-10-CM

## 2016-01-09 DIAGNOSIS — F17210 Nicotine dependence, cigarettes, uncomplicated: Secondary | ICD-10-CM | POA: Insufficient documentation

## 2016-01-09 DIAGNOSIS — M6281 Muscle weakness (generalized): Secondary | ICD-10-CM | POA: Insufficient documentation

## 2016-01-09 DIAGNOSIS — M79601 Pain in right arm: Secondary | ICD-10-CM | POA: Diagnosis present

## 2016-01-09 LAB — CBC WITH DIFFERENTIAL/PLATELET
Basophils Absolute: 0 K/uL (ref 0.0–0.1)
Basophils Relative: 0 %
Eosinophils Absolute: 0.1 K/uL (ref 0.0–0.7)
Eosinophils Relative: 1 %
HCT: 36.5 % (ref 36.0–46.0)
Hemoglobin: 11.9 g/dL — ABNORMAL LOW (ref 12.0–15.0)
Lymphocytes Relative: 30 %
Lymphs Abs: 2.6 K/uL (ref 0.7–4.0)
MCH: 24.8 pg — ABNORMAL LOW (ref 26.0–34.0)
MCHC: 32.6 g/dL (ref 30.0–36.0)
MCV: 76.2 fL — ABNORMAL LOW (ref 78.0–100.0)
Monocytes Absolute: 0.7 K/uL (ref 0.1–1.0)
Monocytes Relative: 8 %
Neutro Abs: 5.5 K/uL (ref 1.7–7.7)
Neutrophils Relative %: 61 %
Platelets: 247 K/uL (ref 150–400)
RBC: 4.79 MIL/uL (ref 3.87–5.11)
RDW: 18.2 % — ABNORMAL HIGH (ref 11.5–15.5)
WBC: 8.9 K/uL (ref 4.0–10.5)

## 2016-01-09 LAB — BASIC METABOLIC PANEL
Anion gap: 4 — ABNORMAL LOW (ref 5–15)
BUN: 13 mg/dL (ref 6–20)
CALCIUM: 8.9 mg/dL (ref 8.9–10.3)
CO2: 24 mmol/L (ref 22–32)
CREATININE: 0.89 mg/dL (ref 0.44–1.00)
Chloride: 107 mmol/L (ref 101–111)
GFR calc Af Amer: >60 mL/min (ref >60)
GLUCOSE: 83 mg/dL (ref 65–99)
Potassium: 4 mmol/L (ref 3.5–5.1)
Sodium: 135 mmol/L (ref 135–145)

## 2016-01-09 NOTE — Discharge Instructions (Signed)
MRI shows no changes from the previous MRI, but is not completely normal. You will need follow-up with a neurologist. Phone number given.

## 2016-01-09 NOTE — ED Notes (Signed)
Patient states "I've been having pain from my right neck down my shoulder and into my right arm for a week. About 3:00 this morning, my right arm got really weak and I couldn't pick it up and my right eye shut. I had this same problem in March and I went to [facility] and they told me I probably had a disc that was pressing on a nerve. They told me I needed a MRI but I don't have the money for it." Patient alert, oriented, and ambulatory with no assistance or difficulty at triage. Equal grips bilaterally and no arm drift bilaterally at triage. Facial movements symmetrical. Patient complaining of right arm and right leg pain at triage.

## 2016-01-09 NOTE — ED Provider Notes (Signed)
History  By signing my name below, I, Bea Graff, attest that this documentation has been prepared under the direction and in the presence of Nat Christen, MD. Electronically Signed: Bea Graff, ED Scribe. 01/09/2016. 1:11 PM.  Chief Complaint  Patient presents with  . Arm Pain   The history is provided by the patient and medical records. No language interpreter was used.    HPI Comments:  Christina Cannon is a 50 y.o. female who presents to the Emergency Department complaining of right-sided HA that began upon waking this morning. She states she fell after trying to get out of the bed this morning. She reports her whole right side feels numb. She states this has been an ongoing issue with similar symptoms intermittently for the past 2-3 months. She states she has been evaluated for this in at [facility] and had an MRI of the neck. She reports the MRI showed a disc issue in her cervical spine. Pt followed up with Dr. Cyndy Freeze and was supposed to have a complete MRI of her entire body but cannot afford it at this time. She has not taken anything to treat her symptoms. She denies modifying factors. She denies slurred speech, confusion, difficulty thinking. She denies any pertinent or chronic medical issues. She reports occasional social drinking and smoking cigarettes about 1 pack every 2-3 weeks.  History reviewed. No pertinent past medical history. Past Surgical History  Procedure Laterality Date  . Cholecystectomy    . Hand surgery     Family History  Problem Relation Age of Onset  . Diabetes Mother   . Hypertension Mother   . Diabetes Father   . Hypertension Father   . Heart failure Father    Social History  Substance Use Topics  . Smoking status: Current Some Day Smoker -- 0.50 packs/day    Types: Cigarettes  . Smokeless tobacco: Never Used  . Alcohol Use: No   OB History    No data available     Review of Systems A complete 10 system review of systems was obtained  and all systems are negative except as noted in the HPI and PMH.   Allergies  Ceftin  Home Medications   Prior to Admission medications   Medication Sig Start Date End Date Taking? Authorizing Provider  aspirin 325 MG EC tablet Take 650 mg by mouth every 6 (six) hours as needed for pain.    Historical Provider, MD  naproxen sodium (ALL DAY PAIN RELIEF) 220 MG tablet Take 220 mg by mouth 2 (two) times daily as needed (FOR PAIN).    Historical Provider, MD  predniSONE (DELTASONE) 20 MG tablet 3 tabs po daily x 3 days, then 2 tabs x 3 days, then 1.5 tabs x 3 days, then 1 tab x 3 days, then 0.5 tabs x 3 days 10/20/15   Sherwood Gambler, MD  traMADol (ULTRAM) 50 MG tablet Take 1 tablet (50 mg total) by mouth every 6 (six) hours as needed. 09/19/15   [name], MD   Triage Vitals: BP 121/66 mmHg  Pulse 72  Temp(Src) 98.9 F (37.2 C) (Oral)  Resp 16  Ht 5\' 7"  (1.702 m)  Wt 197 lb (89.359 kg)  BMI 30.85 kg/m2  SpO2 99%  LMP 12/14/2015 Physical Exam  Constitutional: She is oriented to person, place, and time. She appears well-developed and well-nourished.  HENT:  Head: Normocephalic and atraumatic.  Eyes: Conjunctivae and EOM are normal. Pupils are equal, round, and reactive to light.  Neck: Normal  range of motion. Neck supple.  Cardiovascular: Normal rate and regular rhythm.   Pulmonary/Chest: Effort normal and breath sounds normal.  Abdominal: Soft. Bowel sounds are normal.  Musculoskeletal: Normal range of motion.  Neurological: She is alert and oriented to person, place, and time.  Generalized weakness in RUE and RLE.  Skin: Skin is warm and dry.  Psychiatric: She has a normal mood and affect. Her behavior is normal.  Nursing note and vitals reviewed.   ED Course  Procedures (including critical care time) DIAGNOSTIC STUDIES: Oxygen Saturation is 99% on RA, normal by my interpretation.   COORDINATION OF CARE: 1:09 PM- Will order MRI of brain. Pt verbalizes understanding and  agrees to plan.  Medications - No data to display  Labs Review Labs Reviewed  BASIC METABOLIC PANEL - Abnormal; Notable for the following:    Anion gap 4 (*)    All other components within normal limits  CBC WITH DIFFERENTIAL/PLATELET - Abnormal; Notable for the following:    Hemoglobin 11.9 (*)    MCV 76.2 (*)    MCH 24.8 (*)    RDW 18.2 (*)    All other components within normal limits    Imaging Review No results found. I have personally reviewed and evaluated these images and lab results as part of my medical decision-making.   EKG Interpretation None      MDM   Final diagnoses:  Right arm weakness  Right leg weakness    Patient has full range of motion of her right arm and right leg. No gross neurological deficits. MRI shows hyperintensity in the right cerebellum, right pons, right frontal area, but no major changes. This was discussed with the patient. Referral to local neurologist.  I personally performed the services described in this documentation, which was scribed in my presence. The recorded information has been reviewed and is accurate.      Nat Christen, MD 01/19/16 1336

## 2016-06-11 ENCOUNTER — Encounter (HOSPITAL_COMMUNITY): Payer: Self-pay | Admitting: Emergency Medicine

## 2016-06-11 ENCOUNTER — Emergency Department (HOSPITAL_COMMUNITY): Payer: BLUE CROSS/BLUE SHIELD

## 2016-06-11 ENCOUNTER — Emergency Department (HOSPITAL_COMMUNITY)
Admission: EM | Admit: 2016-06-11 | Discharge: 2016-06-11 | Disposition: A | Discharge status: Home / Self Care | Payer: BLUE CROSS/BLUE SHIELD | Attending: Emergency Medicine | Admitting: Emergency Medicine

## 2016-06-11 DIAGNOSIS — Z79899 Other long term (current) drug therapy: Secondary | ICD-10-CM | POA: Insufficient documentation

## 2016-06-11 DIAGNOSIS — R05 Cough: Secondary | ICD-10-CM | POA: Diagnosis present

## 2016-06-11 DIAGNOSIS — J209 Acute bronchitis, unspecified: Secondary | ICD-10-CM | POA: Diagnosis not present

## 2016-06-11 DIAGNOSIS — F17210 Nicotine dependence, cigarettes, uncomplicated: Secondary | ICD-10-CM | POA: Diagnosis not present

## 2016-06-11 DIAGNOSIS — J069 Acute upper respiratory infection, unspecified: Secondary | ICD-10-CM

## 2016-06-11 MED ORDER — DOXYCYCLINE HYCLATE 100 MG PO CAPS: 100.0000 mg | ORAL_CAPSULE | Freq: Two times a day (BID) | ORAL | 0 refills | Status: DC

## 2016-06-11 MED ORDER — HYDROCODONE-HOMATROPINE 5-1.5 MG/5ML PO SYRP
5.0000 mL | ORAL_SOLUTION | Freq: Four times a day (QID) | ORAL | 0 refills | Status: DC | PRN
Start: 2016-06-11 — End: 2017-06-10

## 2016-06-11 MED ORDER — DEXAMETHASONE 4 MG PO TABS: 4.0000 mg | ORAL_TABLET | Freq: Two times a day (BID) | ORAL | 0 refills | Status: DC

## 2016-06-11 MED ORDER — LORATADINE-PSEUDOEPHEDRINE ER 5-120 MG PO TB12: 1.0000 | ORAL_TABLET | Freq: Two times a day (BID) | ORAL | 0 refills | Status: DC

## 2016-06-11 NOTE — Discharge Instructions (Signed)
Your x-ray is consistent with bronchitis. There is no evidence of pneumonia or congestive heart failure present. Your vital signs within normal limits. Your oxygen level is 100%. It is important that you increase water, Gatorade, liquids, etc. Please wash her hands frequently on as this is contagious. Please use Decadron, doxycycline, and Claritin-D 2 times daily with food. Use Hycodan for cough.This medication may cause drowsiness. Please do not drink, drive, or participate in activity that requires concentration while taking this medication. Please see your primary physician, or return to the emergency department if not improving.

## 2016-06-11 NOTE — ED Triage Notes (Signed)
Patient complaining of cough and generalized body aches x 3 days. States "my husband just passed away with pneumonia last week."

## 2016-06-11 NOTE — ED Provider Notes (Signed)
[facility] DEPT Provider Note    By signing my name below, I, Bea Graff, attest that this documentation has been prepared under the direction and in the presence of Lily Kocher, PA-C. Electronically Signed: Bea Graff, ED Scribe. 06/11/16. 4:26 PM.    History   Chief Complaint Chief Complaint  Patient presents with  . Cough    The history is provided by the patient and medical records. No language interpreter was used.    HPI Comments:  Christina Cannon is a 50 y.o. female who presents to the Emergency Department complaining of a nonproductive, dry cough that began about three days ago. She reports associated sore throat, congestion and generalized body aches. She reports some vomiting and diarrhea two days ago but denies any since. She has not taken anything for her symptoms. She denies modifying factors. She denies fever, rash, abdominal pain, current nausea, vomiting or diarrhea. She states her husband passed last week of pneumonia. She reports smoking occasionally.   History reviewed. No pertinent past medical history.  Patient Active Problem List   Diagnosis Date Noted  . 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 Skin tag of vulva 08/10/2015  . Encounter for annual physical exam 08/10/2015  . UTI (lower urinary tract infection) 08/10/2015    Past Surgical History:  Procedure Laterality Date  . CHOLECYSTECTOMY    . HAND SURGERY      OB History    No data available       Home Medications    Prior to Admission medications   Medication Sig Start Date End Date Taking? Authorizing Provider  aspirin 325 MG EC tablet Take 650 mg by mouth every 6 (six) hours as needed for pain.    Historical Provider, MD  naproxen sodium (ALL DAY PAIN RELIEF) 220 MG tablet Take 220 mg by mouth 2 (two) times daily as needed (FOR PAIN).    Historical Provider, MD  predniSONE (DELTASONE) 20 MG tablet 3 tabs po daily x 3 days, then 2 tabs x 3 days, then 1.5 tabs x 3  days, then 1 tab x 3 days, then 0.5 tabs x 3 days 10/20/15   Sherwood Gambler, MD  traMADol (ULTRAM) 50 MG tablet Take 1 tablet (50 mg total) by mouth every 6 (six) hours as needed. 09/19/15   [name], MD    Family History Family History  Problem Relation Age of Onset  . Diabetes Mother   . Hypertension Mother   . Diabetes Father   . Hypertension Father   . Heart failure Father     Social History Social History  Substance Use Topics  . Smoking status: Current Some Day Smoker    Packs/day: 0.50    Types: Cigarettes  . Smokeless tobacco: Never Used  . Alcohol use No     Allergies   Ceftin [cefuroxime axetil]   Review of Systems Review of Systems  Constitutional: Positive for chills. Negative for fever.  HENT: Positive for congestion, rhinorrhea and sore throat.   Respiratory: Positive for cough.   Gastrointestinal: Negative for abdominal pain, diarrhea, nausea and vomiting.  Skin: Negative for rash.  All other systems reviewed and are negative.    Physical Exam Updated Vital Signs BP 131/75 (BP Location: Left Arm)   Pulse 78   Temp 98.9 F (37.2 C) (Oral)   Resp 20   Ht 5\' 7"  (1.702 m)   Wt 198 lb (89.8 kg)   LMP 06/08/2016   SpO2 100%   BMI 31.01 kg/m  Physical Exam  Constitutional: She is oriented to person, place, and time. She appears well-developed and well-nourished.  HENT:  Head: Normocephalic and atraumatic.  Nasal congestion present  Neck: Normal range of motion.  Cardiovascular: Normal rate.   Pulmonary/Chest: Effort normal. She has rhonchi.  Symmetrical rise and fall of chest. Pt speaks in complete sentences. Course breath sounds. Occasional scattered rhonchi.  Abdominal: Soft. Bowel sounds are normal. There is no tenderness.  Musculoskeletal: Normal range of motion. She exhibits no edema.  Lymphadenopathy:    She has no cervical adenopathy.  Neurological: She is alert and oriented to person, place, and time.  Skin: Skin is warm and dry.  Capillary refill takes less than 2 seconds.  Psychiatric: She has a normal mood and affect. Her behavior is normal.  Nursing note and vitals reviewed.    ED Treatments / Results  DIAGNOSTIC STUDIES: Oxygen Saturation is 100% on RA, normal by my interpretation.   COORDINATION OF CARE: 4:24 PM- Advised pt to take OTC Tylenol/Ibuprofen for pain/fever. Will prescribe cough medication, decongestant, steroid and antibiotic. Pt verbalizes understanding and agrees to plan.  Medications - No data to display  Labs (all labs ordered are listed, but only abnormal results are displayed) Labs Reviewed - No data to display  EKG  EKG Interpretation None       Radiology Dg Chest 2 View  Result Date: 06/11/2016 CLINICAL DATA:  Cough and upper chest pain associated with shortness of breath for the past 2 days EXAM: CHEST  2 VIEW COMPARISON:  Portable chest x-ray of February 11, 2006 FINDINGS: The lungs are borderline hyperinflated. The heart and pulmonary vascularity are normal. The mediastinum is normal in width. There is no pleural effusion. The observed bony thorax exhibits no acute abnormality. IMPRESSION: Mild hyperinflation consistent with deep inspiration or air trapping secondary to acute bronchitis. There is no pneumonia nor CHF. Electronically Signed   By: David  Martinique M.D.   On: 06/11/2016 15:10    Procedures Procedures (including critical care time)  Medications Ordered in ED Medications - No data to display   Initial Impression / Assessment and Plan / ED Course  I have reviewed the triage vital signs and the nursing notes.  Pertinent labs & imaging results that were available during my care of the patient were reviewed by me and considered in my medical decision making (see chart for details).  Clinical Course     **I have reviewed nursing notes, vital signs, and all appropriate lab and imaging results for this patient.*  I personally performed the services described in this  documentation, which was scribed in my presence. The recorded information has been reviewed and is accurate.  Final Clinical Impressions(s) / ED Diagnoses  Vital signs within normal limits. Pulse oximetry is 100%. Chest x-ray shows bronchitis changes, but no pneumonia. Patient will be treated with Decadron, doxycycline, Hycodan, and Claritin-D. Patient is to follow-up with primary physician. Patient will return to the emergency department if any emergent changes, problems, or concerns.    Final diagnoses:  None    New Prescriptions Discharge Medication List as of 06/11/2016  4:38 PM    START taking these medications   Details  dexamethasone (DECADRON) 4 MG tablet Take 1 tablet (4 mg total) by mouth 2 (two) times daily with a meal., Starting Mon 06/11/2016, Print    doxycycline (VIBRAMYCIN) 100 MG capsule Take 1 capsule (100 mg total) by mouth 2 (two) times daily., Starting Mon 06/11/2016, Print    HYDROcodone-homatropine (  HYCODAN) 5-1.5 MG/5ML syrup Take 5 mLs by mouth every 6 (six) hours as needed., Starting Mon 06/11/2016, Print    loratadine-pseudoephedrine (CLARITIN-D 12 HOUR) 5-120 MG tablet Take 1 tablet by mouth 2 (two) times daily., Starting Mon 06/11/2016, Print         Logan, PA-C 06/12/16 [id number]    Fredia Sorrow, MD 06/18/16 [phone]

## 2017-01-14 ENCOUNTER — Emergency Department (HOSPITAL_COMMUNITY): Payer: BLUE CROSS/BLUE SHIELD

## 2017-01-14 ENCOUNTER — Emergency Department (HOSPITAL_COMMUNITY)
Admission: EM | Admit: 2017-01-14 | Discharge: 2017-01-14 | Disposition: A | Discharge status: Home / Self Care | Payer: BLUE CROSS/BLUE SHIELD | Attending: Emergency Medicine | Admitting: Emergency Medicine

## 2017-01-14 ENCOUNTER — Encounter (HOSPITAL_COMMUNITY): Payer: Self-pay

## 2017-01-14 DIAGNOSIS — R079 Chest pain, unspecified: Secondary | ICD-10-CM | POA: Insufficient documentation

## 2017-01-14 DIAGNOSIS — Z5321 Procedure and treatment not carried out due to patient leaving prior to being seen by health care provider: Secondary | ICD-10-CM | POA: Insufficient documentation

## 2017-01-14 LAB — BASIC METABOLIC PANEL
Anion gap: 7 (ref 5–15)
BUN: 8 mg/dL (ref 6–20)
CHLORIDE: 109 mmol/L (ref 101–111)
CO2: 23 mmol/L (ref 22–32)
CREATININE: 1.08 mg/dL — AB (ref 0.44–1.00)
Calcium: 9.3 mg/dL (ref 8.9–10.3)
GFR calc Af Amer: >60 mL/min (ref >60)
GFR calc non Af Amer: 58 mL/min — ABNORMAL LOW (ref >60)
GLUCOSE: 102 mg/dL — AB (ref 65–99)
POTASSIUM: 3.9 mmol/L (ref 3.5–5.1)
SODIUM: 139 mmol/L (ref 135–145)

## 2017-01-14 LAB — I-STAT TROPONIN, ED: Troponin i, poc: 0 ng/mL (ref 0.00–0.08)

## 2017-01-14 LAB — CBC
HEMATOCRIT: 37.4 % (ref 36.0–46.0)
Hemoglobin: 12.6 g/dL (ref 12.0–15.0)
MCH: 26.5 pg (ref 26.0–34.0)
MCHC: 33.7 g/dL (ref 30.0–36.0)
MCV: 78.7 fL (ref 78.0–100.0)
PLATELETS: 268 10*3/uL (ref 150–400)
RBC: 4.75 MIL/uL (ref 3.87–5.11)
RDW: 15.8 % — AB (ref 11.5–15.5)
WBC: 8.4 10*3/uL (ref 4.0–10.5)

## 2017-01-14 NOTE — ED Triage Notes (Signed)
Pt states she was having right side pain yesterday and woke up with right side numbness today. She reports central chst pain. Skin warm and dry. Pt tearful in triage.

## 2017-01-14 NOTE — ED Notes (Signed)
Patient called for room x 3 and did not answer. Taking OTF. Will call once more before discharging.

## 2017-01-14 NOTE — ED Notes (Signed)
Called for PT 3x in the waiting room with no answer.

## 2017-05-29 ENCOUNTER — Encounter (HOSPITAL_COMMUNITY): Payer: Self-pay | Admitting: Emergency Medicine

## 2017-05-29 ENCOUNTER — Emergency Department (HOSPITAL_COMMUNITY): Payer: Self-pay

## 2017-05-29 ENCOUNTER — Emergency Department (HOSPITAL_COMMUNITY)
Admission: EM | Admit: 2017-05-29 | Discharge: 2017-05-30 | Disposition: A | Discharge status: Home / Self Care | Payer: Self-pay | Attending: Emergency Medicine | Admitting: Emergency Medicine

## 2017-05-29 ENCOUNTER — Other Ambulatory Visit: Payer: Self-pay

## 2017-05-29 DIAGNOSIS — K5792 Diverticulitis of intestine, part unspecified, without perforation or abscess without bleeding: Secondary | ICD-10-CM | POA: Insufficient documentation

## 2017-05-29 DIAGNOSIS — F17210 Nicotine dependence, cigarettes, uncomplicated: Secondary | ICD-10-CM | POA: Insufficient documentation

## 2017-05-29 LAB — COMPREHENSIVE METABOLIC PANEL
ALT: 11 U/L — ABNORMAL LOW (ref 14–54)
ANION GAP: 7 (ref 5–15)
AST: 15 U/L (ref 15–41)
Albumin: 3.6 g/dL (ref 3.5–5.0)
Alkaline Phosphatase: 58 U/L (ref 38–126)
BILIRUBIN TOTAL: 0.9 mg/dL (ref 0.3–1.2)
BUN: 6 mg/dL (ref 6–20)
CO2: 25 mmol/L (ref 22–32)
Calcium: 8.7 mg/dL — ABNORMAL LOW (ref 8.9–10.3)
Chloride: 106 mmol/L (ref 101–111)
Creatinine, Ser: 0.92 mg/dL (ref 0.44–1.00)
GFR calc Af Amer: >60 mL/min (ref >60)
Glucose, Bld: 89 mg/dL (ref 65–99)
POTASSIUM: 3.1 mmol/L — AB (ref 3.5–5.1)
Sodium: 138 mmol/L (ref 135–145)
TOTAL PROTEIN: 6.4 g/dL — AB (ref 6.5–8.1)

## 2017-05-29 LAB — CBC
HEMATOCRIT: 37.9 % (ref 36.0–46.0)
HEMOGLOBIN: 12.6 g/dL (ref 12.0–15.0)
MCH: 27 pg (ref 26.0–34.0)
MCHC: 33.2 g/dL (ref 30.0–36.0)
MCV: 81.3 fL (ref 78.0–100.0)
Platelets: 228 10*3/uL (ref 150–400)
RBC: 4.66 MIL/uL (ref 3.87–5.11)
RDW: 15 % (ref 11.5–15.5)
WBC: 9.7 10*3/uL (ref 4.0–10.5)

## 2017-05-29 LAB — URINALYSIS, ROUTINE W REFLEX MICROSCOPIC
BILIRUBIN URINE: NEGATIVE
GLUCOSE, UA: NEGATIVE mg/dL
Ketones, ur: NEGATIVE mg/dL
LEUKOCYTES UA: NEGATIVE
NITRITE: NEGATIVE
Protein, ur: NEGATIVE mg/dL
SPECIFIC GRAVITY, URINE: 1.013 (ref 1.005–1.030)
pH: 5 (ref 5.0–8.0)

## 2017-05-29 LAB — POC URINE PREG, ED: PREG TEST UR: NEGATIVE

## 2017-05-29 LAB — LIPASE, BLOOD: Lipase: 21 U/L (ref 11–51)

## 2017-05-29 MED ORDER — IOPAMIDOL (ISOVUE-300) INJECTION 61%
INTRAVENOUS | Status: AC
  Administered 2017-05-29: 100 mL
  Filled 2017-05-29: qty 100

## 2017-05-29 MED ORDER — ONDANSETRON 4 MG PO TBDP
ORAL_TABLET | ORAL | Status: AC
  Filled 2017-05-29: qty 1

## 2017-05-29 MED ORDER — ONDANSETRON 4 MG PO TBDP
4.0000 mg | ORAL_TABLET | Freq: Once | ORAL | Status: AC | PRN
  Administered 2017-05-29: 4 mg via ORAL

## 2017-05-30 MED ORDER — METRONIDAZOLE 500 MG PO TABS
500.0000 mg | ORAL_TABLET | Freq: Once | ORAL | Status: AC
  Administered 2017-05-30: 500 mg via ORAL
  Filled 2017-05-30: qty 1

## 2017-05-30 MED ORDER — ONDANSETRON 4 MG PO TBDP: 4.0000 mg | ORAL_TABLET | Freq: Three times a day (TID) | ORAL | 0 refills | Status: DC | PRN

## 2017-05-30 MED ORDER — METRONIDAZOLE 500 MG PO TABS: 500.0000 mg | ORAL_TABLET | Freq: Two times a day (BID) | ORAL | 0 refills | Status: DC

## 2017-05-30 MED ORDER — CIPROFLOXACIN HCL 500 MG PO TABS: 500.0000 mg | ORAL_TABLET | Freq: Two times a day (BID) | ORAL | 0 refills | Status: DC

## 2017-05-30 MED ORDER — HYDROCODONE-ACETAMINOPHEN 5-325 MG PO TABS
2.0000 | ORAL_TABLET | ORAL | 0 refills | Status: DC | PRN
Start: 2017-05-30 — End: 2017-06-20

## 2017-05-30 MED ORDER — CIPROFLOXACIN HCL 500 MG PO TABS
500.0000 mg | ORAL_TABLET | Freq: Once | ORAL | Status: AC
  Administered 2017-05-30: 500 mg via ORAL
  Filled 2017-05-30: qty 1

## 2017-05-30 NOTE — ED Provider Notes (Signed)
[facility] EMERGENCY DEPARTMENT Provider Note   CSN: [id number] Arrival date & time: 05/29/17  1352     History   Chief Complaint Chief Complaint  Patient presents with  . Hematemesis    HPI Christina Cannon is a 51 y.o. female.  The history is provided by the patient. No language interpreter was used.  Abdominal Cramping  This is a new problem. The current episode started yesterday. The problem occurs constantly. The problem has not changed since onset.Associated symptoms include abdominal pain. Nothing aggravates the symptoms. Nothing relieves the symptoms. She has tried nothing for the symptoms. The treatment provided no relief.  Pt complains of pain in lower abdomen.    No past medical history on file.  Patient Active Problem List   Diagnosis Date Noted  . 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 Skin tag of vulva 08/10/2015  . Encounter for annual physical exam 08/10/2015  . UTI (lower urinary tract infection) 08/10/2015    Past Surgical History:  Procedure Laterality Date  . CHOLECYSTECTOMY    . HAND SURGERY      OB History    No data available       Home Medications    Prior to Admission medications   Medication Sig Start Date End Date Taking? Authorizing Provider  acetaminophen (TYLENOL) 500 MG tablet Take 500 mg every 6 (six) hours as needed by mouth (for pain).   Yes [provider]  aspirin 325 MG EC tablet Take 325 mg every 6 (six) hours as needed by mouth for pain.    Yes [provider]  dexamethasone (DECADRON) 4 MG tablet Take 1 tablet (4 mg total) by mouth 2 (two) times daily with a meal. Patient not taking: Reported on 05/29/2017 06/11/16   Lily Kocher, PA-C  doxycycline (VIBRAMYCIN) 100 MG capsule Take 1 capsule (100 mg total) by mouth 2 (two) times daily. Patient not taking: Reported on 05/29/2017 06/11/16   Lily Kocher, PA-C  HYDROcodone-homatropine [facility]) 5-1.5 MG/5ML syrup Take 5 mLs by  mouth every 6 (six) hours as needed. Patient not taking: Reported on 05/29/2017 06/11/16   Lily Kocher, PA-C  loratadine-pseudoephedrine (CLARITIN-D 12 HOUR) 5-120 MG tablet Take 1 tablet by mouth 2 (two) times daily. Patient not taking: Reported on 05/29/2017 06/11/16   Lily Kocher, PA-C  predniSONE (DELTASONE) 20 MG tablet 3 tabs po daily x 3 days, then 2 tabs x 3 days, then 1.5 tabs x 3 days, then 1 tab x 3 days, then 0.5 tabs x 3 days Patient not taking: Reported on 05/29/2017 10/20/15   Sherwood Gambler, MD  traMADol (ULTRAM) 50 MG tablet Take 1 tablet (50 mg total) by mouth every 6 (six) hours as needed. Patient not taking: Reported on 05/29/2017 09/19/15   [name], MD    Family History Family History  Problem Relation Age of Onset  . Diabetes Mother   . Hypertension Mother   . Diabetes Father   . Hypertension Father   . Heart failure Father     Social History Social History   Tobacco Use  . Smoking status: Current Some Day Smoker    Packs/day: 0.50    Types: Cigarettes  . Smokeless tobacco: Never Used  Substance Use Topics  . Alcohol use: No  . Drug use: Yes    Types: Marijuana    Comment: "occasionally"     Allergies   Ceftin [cefuroxime axetil]   Review of Systems Review of Systems  Gastrointestinal: Positive for abdominal  pain.  All other systems reviewed and are negative.    Physical Exam Updated Vital Signs BP 137/81   Pulse 71   Temp 98.5 F (36.9 C) (Oral)   Resp 16   Ht 5\' 7"  (1.702 m)   Wt 93 kg (205 lb)   LMP 05/06/2017   SpO2 98%   BMI 32.11 kg/m   Physical Exam  Constitutional: She appears well-developed and well-nourished. No distress.  HENT:  Head: Normocephalic and atraumatic.  Right Ear: External ear normal.  Left Ear: External ear normal.  Nose: Nose normal.  Mouth/Throat: Oropharynx is clear and moist.  Eyes: Conjunctivae are normal.  Neck: Neck supple.  Cardiovascular: Normal rate and regular rhythm.  No murmur  heard. Pulmonary/Chest: Effort normal and breath sounds normal. No respiratory distress.  Abdominal: Soft. There is tenderness. There is no rebound and no guarding.  Musculoskeletal: Normal range of motion. She exhibits no edema.  Neurological: She is alert.  Skin: Skin is warm and dry.  Psychiatric: She has a normal mood and affect.  Nursing note and vitals reviewed.    ED Treatments / Results  Labs (all labs ordered are listed, but only abnormal results are displayed) Labs Reviewed  COMPREHENSIVE METABOLIC PANEL - Abnormal; Notable for the following components:      Result Value   Potassium 3.1 (*)    Calcium 8.7 (*)    Total Protein 6.4 (*)    ALT 11 (*)    All other components within normal limits  URINALYSIS, ROUTINE W REFLEX MICROSCOPIC - Abnormal; Notable for the following components:   Hgb urine dipstick MODERATE (*)    Bacteria, UA RARE (*)    Squamous Epithelial / LPF 0-5 (*)    All other components within normal limits  LIPASE, BLOOD  CBC  POC URINE PREG, ED    EKG  EKG Interpretation None       Radiology Ct Abdomen Pelvis W Contrast  Result Date: 05/30/2017 CLINICAL DATA:  Abdominal pain EXAM: CT ABDOMEN AND PELVIS WITH CONTRAST TECHNIQUE: Multidetector CT imaging of the abdomen and pelvis was performed using the standard protocol following bolus administration of intravenous contrast. CONTRAST:  116mL ISOVUE-300 IOPAMIDOL (ISOVUE-300) INJECTION 61% COMPARISON:  None. FINDINGS: Lower chest: No pulmonary nodules or pleural effusion. No visible pericardial effusion. Hepatobiliary: Normal hepatic contours and density. No visible biliary dilatation. Status post cholecystectomy. Pancreas: Normal contours without ductal dilatation. No peripancreatic fluid collection. Spleen: Normal. Adrenals/Urinary Tract: --Adrenal glands: Normal. --Right kidney/ureter: No hydronephrosis or perinephric stranding. No nephrolithiasis. No obstructing ureteral stones. --Left  kidney/ureter: No hydronephrosis or perinephric stranding. No nephrolithiasis. No obstructing ureteral stones. --Urinary bladder: Unremarkable. Stomach/Bowel: --Stomach/Duodenum: No hiatal hernia or other gastric abnormality. Normal duodenal course and caliber. --Small bowel: No dilatation or inflammation. --Colon: There is rectosigmoid diverticulosis with mild inflammatory change of the surrounding fat of the proximal sigmoid colon. --Appendix: Normal. Vascular/Lymphatic: Atherosclerotic calcification is present within the non-aneurysmal abdominal aorta, without hemodynamically significant stenosis. No abdominal or pelvic lymphadenopathy. Reproductive: Normal uterus and ovaries.  Bartholin gland cyst. Musculoskeletal. Multilevel degenerative disc disease and facet arthrosis. No bony spinal canal stenosis. Other: None. IMPRESSION: 1. Uncomplicated proximal sigmoid colon diverticulitis without free intraperitoneal air or abscess/fluid collection. 2.  Aortic Atherosclerosis (ICD10-I70.0). Electronically Signed   By: Ulyses Jarred M.D.   On: 05/30/2017 00:01    Procedures Procedures (including critical care time)  Medications Ordered in ED Medications  ondansetron (ZOFRAN-ODT) 4 MG disintegrating tablet (not administered)  ondansetron (ZOFRAN-ODT) disintegrating tablet  4 mg (4 mg Oral Given 05/29/17 1437)  iopamidol (ISOVUE-300) 61 % injection (100 mLs  Contrast Given 05/29/17 2342)     Initial Impression / Assessment and Plan / ED Course  I have reviewed the triage vital signs and the nursing notes.  Pertinent labs & imaging results that were available during my care of the patient were reviewed by me and considered in my medical decision making (see chart for details).     Pt advised to follow up with primary care and Gi doctor for evaluation.   Pt given rx for cipro, flagyl, zofran and hydrocodone for pain.    Final Clinical Impressions(s) / ED Diagnoses   Final diagnoses:  Diverticulitis     ED Discharge Orders        Ordered    ciprofloxacin (CIPRO) 500 MG tablet  2 times daily     05/30/17 0024    metroNIDAZOLE (FLAGYL) 500 MG tablet  2 times daily     05/30/17 0024    HYDROcodone-acetaminophen (NORCO/VICODIN) 5-325 MG tablet  Every 4 hours PRN     05/30/17 0024    ondansetron (ZOFRAN ODT) 4 MG disintegrating tablet  Every 8 hours PRN     05/30/17 0024    An After Visit Summary was printed and given to the patient.    Fransico Meadow, PA-C 05/30/17 Fredia Beets, MD 05/31/17 [phone]

## 2017-06-10 ENCOUNTER — Inpatient Hospital Stay (HOSPITAL_COMMUNITY)
Admission: EM | Admit: 2017-06-10 | Discharge: 2017-06-13 | DRG: 392 | Disposition: A | Discharge status: Home / Self Care | Payer: Self-pay | Attending: Internal Medicine | Admitting: Internal Medicine

## 2017-06-10 ENCOUNTER — Other Ambulatory Visit: Payer: Self-pay

## 2017-06-10 ENCOUNTER — Emergency Department (HOSPITAL_COMMUNITY): Payer: Self-pay

## 2017-06-10 ENCOUNTER — Encounter (HOSPITAL_COMMUNITY): Payer: Self-pay

## 2017-06-10 DIAGNOSIS — T373X5A Adverse effect of other antiprotozoal drugs, initial encounter: Secondary | ICD-10-CM | POA: Diagnosis present

## 2017-06-10 DIAGNOSIS — K5900 Constipation, unspecified: Secondary | ICD-10-CM | POA: Diagnosis present

## 2017-06-10 DIAGNOSIS — I700 Atherosclerosis of aorta: Secondary | ICD-10-CM | POA: Diagnosis present

## 2017-06-10 DIAGNOSIS — K521 Toxic gastroenteritis and colitis: Secondary | ICD-10-CM | POA: Diagnosis present

## 2017-06-10 DIAGNOSIS — R109 Unspecified abdominal pain: Secondary | ICD-10-CM | POA: Diagnosis present

## 2017-06-10 DIAGNOSIS — K5792 Diverticulitis of intestine, part unspecified, without perforation or abscess without bleeding: Secondary | ICD-10-CM | POA: Diagnosis present

## 2017-06-10 DIAGNOSIS — T368X5A Adverse effect of other systemic antibiotics, initial encounter: Secondary | ICD-10-CM | POA: Diagnosis present

## 2017-06-10 DIAGNOSIS — R1030 Lower abdominal pain, unspecified: Secondary | ICD-10-CM

## 2017-06-10 DIAGNOSIS — K5732 Diverticulitis of large intestine without perforation or abscess without bleeding: Principal | ICD-10-CM | POA: Diagnosis present

## 2017-06-10 DIAGNOSIS — Z881 Allergy status to other antibiotic agents status: Secondary | ICD-10-CM

## 2017-06-10 DIAGNOSIS — D72829 Elevated white blood cell count, unspecified: Secondary | ICD-10-CM | POA: Diagnosis present

## 2017-06-10 DIAGNOSIS — R112 Nausea with vomiting, unspecified: Secondary | ICD-10-CM | POA: Diagnosis present

## 2017-06-10 HISTORY — DX: Diverticulitis of intestine, part unspecified, without perforation or abscess without bleeding: K57.92

## 2017-06-10 LAB — COMPREHENSIVE METABOLIC PANEL
ALBUMIN: 3.7 g/dL (ref 3.5–5.0)
ALT: 19 U/L (ref 14–54)
AST: 33 U/L (ref 15–41)
Alkaline Phosphatase: 61 U/L (ref 38–126)
Anion gap: 7 (ref 5–15)
BUN: 9 mg/dL (ref 6–20)
CHLORIDE: 108 mmol/L (ref 101–111)
CO2: 25 mmol/L (ref 22–32)
Calcium: 9 mg/dL (ref 8.9–10.3)
Creatinine, Ser: 0.88 mg/dL (ref 0.44–1.00)
GFR calc Af Amer: >60 mL/min (ref >60)
GFR calc non Af Amer: >60 mL/min (ref >60)
GLUCOSE: 86 mg/dL (ref 65–99)
POTASSIUM: 3.6 mmol/L (ref 3.5–5.1)
SODIUM: 140 mmol/L (ref 135–145)
Total Bilirubin: 0.7 mg/dL (ref 0.3–1.2)
Total Protein: 7.2 g/dL (ref 6.5–8.1)

## 2017-06-10 LAB — CBC
HEMATOCRIT: 38.9 % (ref 36.0–46.0)
Hemoglobin: 13.2 g/dL (ref 12.0–15.0)
MCH: 27.8 pg (ref 26.0–34.0)
MCHC: 33.9 g/dL (ref 30.0–36.0)
MCV: 81.9 fL (ref 78.0–100.0)
Platelets: 237 10*3/uL (ref 150–400)
RBC: 4.75 MIL/uL (ref 3.87–5.11)
RDW: 14.9 % (ref 11.5–15.5)
WBC: 11.6 10*3/uL — AB (ref 4.0–10.5)

## 2017-06-10 LAB — URINALYSIS, ROUTINE W REFLEX MICROSCOPIC
BILIRUBIN URINE: NEGATIVE
Glucose, UA: NEGATIVE mg/dL
Ketones, ur: NEGATIVE mg/dL
Leukocytes, UA: NEGATIVE
Nitrite: NEGATIVE
PROTEIN: NEGATIVE mg/dL
SPECIFIC GRAVITY, URINE: 1.015 (ref 1.005–1.030)
pH: 7 (ref 5.0–8.0)

## 2017-06-10 LAB — I-STAT BETA HCG BLOOD, ED (MC, WL, AP ONLY)

## 2017-06-10 LAB — LIPASE, BLOOD: LIPASE: 21 U/L (ref 11–51)

## 2017-06-10 MED ORDER — CIPROFLOXACIN IN D5W 400 MG/200ML IV SOLN
400.0000 mg | Freq: Two times a day (BID) | INTRAVENOUS | Status: DC
  Administered 2017-06-10 – 2017-06-11 (×3): 400 mg via INTRAVENOUS
  Filled 2017-06-10 (×3): qty 200

## 2017-06-10 MED ORDER — METOCLOPRAMIDE HCL 5 MG/ML IJ SOLN
10.0000 mg | Freq: Once | INTRAMUSCULAR | Status: AC
  Administered 2017-06-10: 10 mg via INTRAVENOUS
  Filled 2017-06-10: qty 2

## 2017-06-10 MED ORDER — IOPAMIDOL (ISOVUE-300) INJECTION 61%
100.0000 mL | Freq: Once | INTRAVENOUS | Status: AC | PRN
  Administered 2017-06-10: 100 mL via INTRAVENOUS

## 2017-06-10 MED ORDER — HYDROCODONE-ACETAMINOPHEN 5-325 MG PO TABS
2.0000 | ORAL_TABLET | ORAL | Status: DC | PRN
  Administered 2017-06-10: 2 via ORAL
  Filled 2017-06-10 (×2): qty 2

## 2017-06-10 MED ORDER — ACETAMINOPHEN 650 MG RE SUPP: 650.0000 mg | Freq: Four times a day (QID) | RECTAL | Status: DC | PRN

## 2017-06-10 MED ORDER — ACETAMINOPHEN 325 MG PO TABS
650.0000 mg | ORAL_TABLET | Freq: Four times a day (QID) | ORAL | Status: DC | PRN
  Administered 2017-06-13: 650 mg via ORAL
  Filled 2017-06-10: qty 2

## 2017-06-10 MED ORDER — SODIUM CHLORIDE 0.9 % IV BOLUS (SEPSIS)
1000.0000 mL | Freq: Once | INTRAVENOUS | Status: AC
  Administered 2017-06-10: 1000 mL via INTRAVENOUS

## 2017-06-10 MED ORDER — ACETAMINOPHEN 500 MG PO TABS: 500.0000 mg | ORAL_TABLET | Freq: Four times a day (QID) | ORAL | Status: DC | PRN

## 2017-06-10 MED ORDER — DEXTROSE-NACL 5-0.45 % IV SOLN
INTRAVENOUS | Status: DC
  Administered 2017-06-10: 1000 mL via INTRAVENOUS
  Administered 2017-06-11 (×2): via INTRAVENOUS
  Administered 2017-06-12: 1000 mL via INTRAVENOUS
  Administered 2017-06-12: 21:00:00 via INTRAVENOUS

## 2017-06-10 MED ORDER — HYDROMORPHONE HCL 1 MG/ML IJ SOLN
1.0000 mg | INTRAMUSCULAR | Status: DC | PRN
  Administered 2017-06-11 – 2017-06-12 (×4): 1 mg via INTRAVENOUS
  Filled 2017-06-10 (×4): qty 1

## 2017-06-10 MED ORDER — IOPAMIDOL (ISOVUE-300) INJECTION 61%
INTRAVENOUS | Status: AC
  Filled 2017-06-10: qty 100

## 2017-06-10 MED ORDER — ENOXAPARIN SODIUM 40 MG/0.4ML ~~LOC~~ SOLN
40.0000 mg | Freq: Every day | SUBCUTANEOUS | Status: DC
  Administered 2017-06-10 – 2017-06-12 (×3): 40 mg via SUBCUTANEOUS
  Filled 2017-06-10 (×4): qty 0.4

## 2017-06-10 MED ORDER — MORPHINE SULFATE (PF) 4 MG/ML IV SOLN
4.0000 mg | Freq: Once | INTRAVENOUS | Status: AC
  Administered 2017-06-10: 4 mg via INTRAVENOUS
  Filled 2017-06-10: qty 1

## 2017-06-10 MED ORDER — ONDANSETRON HCL 4 MG/2ML IJ SOLN
4.0000 mg | Freq: Four times a day (QID) | INTRAMUSCULAR | Status: DC | PRN
  Administered 2017-06-11 (×3): 4 mg via INTRAVENOUS
  Filled 2017-06-10 (×3): qty 2

## 2017-06-10 MED ORDER — METRONIDAZOLE IN NACL 5-0.79 MG/ML-% IV SOLN
500.0000 mg | Freq: Three times a day (TID) | INTRAVENOUS | Status: DC
  Administered 2017-06-10 – 2017-06-12 (×5): 500 mg via INTRAVENOUS
  Filled 2017-06-10 (×6): qty 100

## 2017-06-10 MED ORDER — ONDANSETRON HCL 4 MG PO TABS: 4.0000 mg | ORAL_TABLET | Freq: Four times a day (QID) | ORAL | Status: DC | PRN

## 2017-06-10 MED ORDER — HYDROCODONE-ACETAMINOPHEN 5-325 MG PO TABS: 1.0000 | ORAL_TABLET | ORAL | Status: DC | PRN

## 2017-06-10 NOTE — ED Triage Notes (Signed)
Patient states she was diagnosed with diverticulitis last week. Patient states her symptoms of pain, N/V are not getting any better.

## 2017-06-10 NOTE — ED Provider Notes (Signed)
[facility] DEPT Provider Note   CSN: [id number] Arrival date & time: 06/10/17  1344     History   Chief Complaint Chief Complaint  Patient presents with  . Abdominal Pain  . Emesis    HPI Christina Cannon is a 51 y.o. female presenting for diverticulitis with continuing pain and worsening symptoms.  Patient states that she was seen at [facility] emergency room a week ago and diagnosed with diverticulitis.  She was placed on Cipro, Flagyl, Zofran, and pain medicine.  She has been taking this as prescribed, but reports feeling worse, with continued abdominal pain, persistent vomiting.  She states she has not been able to tolerate p.o. intake for the past 2 days.  She reports chills, no fevers.  Abdominal pain is across the low abdomen, worse on the left side.  It is constant, palpation makes it worse, nothing makes it better.  Patient states that prior to her visit last week, she was constipated, and since starting antibiotics, she has had soft and frequent stools.  She reports upper abdominal bloating, no generalized abdominal distention.  She denies blood in her stool.  She denies chest pain, shortness of breath, or urinary symptoms.  She states that 5 years ago she had diverticulitis which improved with antibiotics, and she never followed up with GI.  She has not followed up with PCP or GI since being seen last week, she has been feeling worse and not have the time to follow-up.  Abdominal surgeries include cholecystectomy.    HPI  Past Medical History:  Diagnosis Date  . Diverticulitis     Patient Active Problem List   Diagnosis Date Noted  . 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 Skin tag of vulva 08/10/2015  . Encounter for annual physical exam 08/10/2015  . UTI (lower urinary tract infection) 08/10/2015    Past Surgical History:  Procedure Laterality Date  . CHOLECYSTECTOMY    . HAND SURGERY      OB History    No data available         Home Medications    Prior to Admission medications   Medication Sig Start Date End Date Taking? Authorizing Provider  ciprofloxacin (CIPRO) 500 MG tablet Take 1 tablet (500 mg total) 2 (two) times daily by mouth. 05/30/17  Yes Fransico Meadow, PA-C  HYDROcodone-acetaminophen (NORCO/VICODIN) 5-325 MG tablet Take 2 tablets every 4 (four) hours as needed by mouth. Patient taking differently: Take 2 tablets every 4 (four) hours as needed by mouth for moderate pain or severe pain.  05/30/17  Yes Caryl Ada K, PA-C  metroNIDAZOLE (FLAGYL) 500 MG tablet Take 1 tablet (500 mg total) 2 (two) times daily by mouth. 05/30/17  Yes Caryl Ada K, PA-C  ondansetron (ZOFRAN ODT) 4 MG disintegrating tablet Take 1 tablet (4 mg total) every 8 (eight) hours as needed by mouth for nausea or vomiting. 05/30/17  Yes Fransico Meadow, PA-C  acetaminophen (TYLENOL) 500 MG tablet Take 500 mg every 6 (six) hours as needed by mouth (for pain).    [provider]  aspirin 325 MG EC tablet Take 325 mg every 6 (six) hours as needed by mouth for pain.     [provider]  dexamethasone (DECADRON) 4 MG tablet Take 1 tablet (4 mg total) by mouth 2 (two) times daily with a meal. Patient not taking: Reported on 05/29/2017 06/11/16   Lily Kocher, PA-C  doxycycline (VIBRAMYCIN) 100 MG capsule Take 1 capsule (100  mg total) by mouth 2 (two) times daily. Patient not taking: Reported on 05/29/2017 06/11/16   Lily Kocher, PA-C  HYDROcodone-homatropine [facility]) 5-1.5 MG/5ML syrup Take 5 mLs by mouth every 6 (six) hours as needed. Patient not taking: Reported on 05/29/2017 06/11/16   Lily Kocher, PA-C  loratadine-pseudoephedrine (CLARITIN-D 12 HOUR) 5-120 MG tablet Take 1 tablet by mouth 2 (two) times daily. Patient not taking: Reported on 05/29/2017 06/11/16   Lily Kocher, PA-C  predniSONE (DELTASONE) 20 MG tablet 3 tabs po daily x 3 days, then 2 tabs x 3 days, then 1.5 tabs x 3 days, then 1 tab x 3  days, then 0.5 tabs x 3 days Patient not taking: Reported on 05/29/2017 10/20/15   Sherwood Gambler, MD  traMADol (ULTRAM) 50 MG tablet Take 1 tablet (50 mg total) by mouth every 6 (six) hours as needed. Patient not taking: Reported on 05/29/2017 09/19/15   [name], MD    Family History Family History  Problem Relation Age of Onset  . Diabetes Mother   . Hypertension Mother   . Diabetes Father   . Hypertension Father   . Heart failure Father     Social History Social History   Tobacco Use  . Smoking status: Current Some Day Smoker    Packs/day: 0.50    Types: Cigarettes  . Smokeless tobacco: Never Used  Substance Use Topics  . Alcohol use: No  . Drug use: Yes    Types: Marijuana    Comment: "occasionally"     Allergies   Ceftin [cefuroxime axetil]   Review of Systems Review of Systems  Constitutional: Positive for appetite change and chills.  Gastrointestinal: Positive for abdominal distention, abdominal pain, diarrhea, nausea and vomiting.  All other systems reviewed and are negative.    Physical Exam Updated Vital Signs BP (!) 173/93   Pulse (!) 55   Temp 98.3 F (36.8 C) (Oral)   Resp 16   Ht 5\' 7"  (1.702 m)   Wt 93 kg (205 lb)   LMP 04/22/2017   SpO2 98%   BMI 32.11 kg/m   Physical Exam  Constitutional: She is oriented to person, place, and time. She appears well-developed and well-nourished. No distress.  HENT:  Head: Normocephalic and atraumatic.  Mouth/Throat: Uvula is midline and oropharynx is clear and moist. Mucous membranes are dry.  Eyes: EOM are normal.  Neck: Normal range of motion.  Cardiovascular: Normal rate, regular rhythm and intact distal pulses.  Pulmonary/Chest: Effort normal and breath sounds normal. No respiratory distress. She has no wheezes. She has no rales.  Abdominal: Soft. She exhibits no distension and no mass. There is tenderness. There is no rebound and no guarding.  Tenderness to palpation of generalized abdomen,  worse left lower quadrant.  No rigidity or obvious distention.  Musculoskeletal: Normal range of motion.  Neurological: She is alert and oriented to person, place, and time.  Skin: Skin is warm and dry.  Psychiatric: She has a normal mood and affect.  Nursing note and vitals reviewed.    ED Treatments / Results  Labs (all labs ordered are listed, but only abnormal results are displayed) Labs Reviewed  CBC - Abnormal; Notable for the following components:      Result Value   WBC 11.6 (*)    All other components within normal limits  LIPASE, BLOOD  COMPREHENSIVE METABOLIC PANEL  URINALYSIS, ROUTINE W REFLEX MICROSCOPIC  I-STAT BETA HCG BLOOD, ED ([facility], [facility], [facility] ONLY)    EKG  EKG Interpretation None       Radiology Ct Abdomen Pelvis W Contrast  Result Date: 06/10/2017 CLINICAL DATA:  Diverticulitis. Continued pain and nausea with vomiting EXAM: CT ABDOMEN AND PELVIS WITH CONTRAST TECHNIQUE: Multidetector CT imaging of the abdomen and pelvis was performed using the standard protocol following bolus administration of intravenous contrast. CONTRAST:  154mL ISOVUE-300 IOPAMIDOL (ISOVUE-300) INJECTION 61% COMPARISON:  CT 05/29/2017 FINDINGS: Lower chest: Lung bases are clear. Hepatobiliary: No focal hepatic lesion. Postcholecystectomy. No biliary dilatation. Pancreas: Pancreas is normal. No ductal dilatation. No pancreatic inflammation. Small fat lesion in tail the pancreas measuring 9 mm is unchanged. Spleen: Normal spleen Adrenals/urinary tract: Adrenal glands and kidneys are normal. The ureters and bladder normal. Stomach/Bowel: Stomach, duodenum, small-bowel and cecum normal. Appendix is normal. There is an interval improvement in the inflammation surrounding the descending colon. There multiple diverticular through this region (image [id number] of series 2). Findings consistent resolving diverticulitis. Fat inflammation decreased. No perforation or abscess. Sigmoid colon rectum normal  Vascular/Lymphatic: Abdominal aorta is normal caliber with atherosclerotic calcification. There is no retroperitoneal or periportal lymphadenopathy. No pelvic lymphadenopathy. Reproductive: Uterus and ovaries unremarkable. Probable leiomyoma extending from the posterior aspect the lower uterine segment Other: No free fluid. Musculoskeletal: No aggressive osseous lesion. IMPRESSION: 1. Improving diverticulitis of the descending colon. No complicating features. 2.  Atherosclerotic calcification of the aorta.  (ICD10-I70.0). 3. Postcholecystectomy Electronically Signed   By: Suzy Bouchard M.D.   On: 06/10/2017 20:29    Procedures Procedures (including critical care time)  Medications Ordered in ED Medications  iopamidol (ISOVUE-300) 61 % injection (not administered)  sodium chloride 0.9 % bolus 1,000 mL (1,000 mLs Intravenous New Bag/Given 06/10/17 1954)  metoCLOPramide (REGLAN) injection 10 mg (10 mg Intravenous Given 06/10/17 1954)  morphine 4 MG/ML injection 4 mg (4 mg Intravenous Given 06/10/17 1954)  iopamidol (ISOVUE-300) 61 % injection 100 mL (100 mLs Intravenous Contrast Given 06/10/17 2011)     Initial Impression / Assessment and Plan / ED Course  I have reviewed the triage vital signs and the nursing notes.  Pertinent labs & imaging results that were available during my care of the patient were reviewed by me and considered in my medical decision making (see chart for details).     Patient presenting for evaluation of continued abdominal pain, nausea, vomiting.  She has been on antibiotics for a week for diverticulitis, and states symptoms are not improving.  Physical exam shows patient is dry, afebrile, not tachycardic.  Generalized tenderness to palpation of the abdomen, worse left lower quadrant.  Basic labs shows increase in white count 11.6.  Labs otherwise reassuring.  Urine pending.  Will obtain CT abdomen for further evaluation.  CT abdomen shows improving diverticulitis.   Discussed findings with patient, and offered discharge on different antibiotic, but pt states she would feel more comfortable in the hospital.  Will consult with hospitalist for admission.  Discussed with hospitalist, patient to be admitted.   Final Clinical Impressions(s) / ED Diagnoses   Final diagnoses:  Diverticulitis    ED Discharge Orders    None       Franchot Heidelberg, PA-C 06/10/17 2221    Isla Pence, MD 06/10/17 2223

## 2017-06-10 NOTE — H&P (Signed)
[facility] History and Physical  Christina Cannon AYT:[id number] DOB: 12-Oct-1965 DOA: 06/10/2017  Referring physician: Dr Gilford Raid PCP: Patient, No Pcp Per   Chief Complaint: Nausea, abd pain, recently dx'd w/ diverticulitis  HPI: Christina Cannon is a 51 y.o. female with no sig PMH presented to ED on 11/7 with N/V and CT scan showing sigmoid diverticulitis. She was stable and dc'd home on po cipro 500 bid and po flagyl 500 bid.  She has continued to have severe nausea, vomiting w/ almost every meal and returns to ED today w/ this c/o. Repeat CT shows improving diverticulitis w/o perforation or abscess. Asked to see for admission.   Main c/o's are refractory N/V w/ almost every meal and also having LLQ pain which is not really better or worse.  Having night sweats and some chills, no fevers.  No CP, no SOB or dysuria.    Pt was always constipated. Now she has diarrhea from the abx.  No sig PMH, PSH had GB removed.  Was married 80 yrs, husband died last year of leukemia. NO children.  Lives w/ her sister now.  occ tobacco, no etoh.   -    ROS  denies CP  no joint pain   no HA  no blurry vision  no dysuria  no difficulty voiding  no change in urine color    Past Medical History  Past Medical History:  Diagnosis Date  . Diverticulitis    Past Surgical History  Past Surgical History:  Procedure Laterality Date  . CHOLECYSTECTOMY    . HAND SURGERY     Family History  Family History  Problem Relation Age of Onset  . Diabetes Mother   . Hypertension Mother   . Diabetes Father   . Hypertension Father   . Heart failure Father    Social History  reports that she has been smoking cigarettes.  She has been smoking about 0.50 packs per day. she has never used smokeless tobacco. She reports that she uses drugs. Drug: Marijuana. She reports that she does not drink alcohol. Allergies  Allergies  Allergen Reactions  . Ceftin [Cefuroxime Axetil] Anaphylaxis and  Swelling    Throat and face swell   Home medications Prior to Admission medications   Medication Sig Start Date End Date Taking? Authorizing Provider  ciprofloxacin (CIPRO) 500 MG tablet Take 1 tablet (500 mg total) 2 (two) times daily by mouth. 05/30/17  Yes Fransico Meadow, PA-C  HYDROcodone-acetaminophen (NORCO/VICODIN) 5-325 MG tablet Take 2 tablets every 4 (four) hours as needed by mouth. Patient taking differently: Take 2 tablets every 4 (four) hours as needed by mouth for moderate pain or severe pain.  05/30/17  Yes Caryl Ada K, PA-C  metroNIDAZOLE (FLAGYL) 500 MG tablet Take 1 tablet (500 mg total) 2 (two) times daily by mouth. 05/30/17  Yes Caryl Ada K, PA-C  ondansetron (ZOFRAN ODT) 4 MG disintegrating tablet Take 1 tablet (4 mg total) every 8 (eight) hours as needed by mouth for nausea or vomiting. 05/30/17  Yes Fransico Meadow, PA-C  acetaminophen (TYLENOL) 500 MG tablet Take 500 mg every 6 (six) hours as needed by mouth (for pain).    [provider]  aspirin 325 MG EC tablet Take 325 mg every 6 (six) hours as needed by mouth for pain.     [provider]  dexamethasone (DECADRON) 4 MG tablet Take 1 tablet (4 mg total) by mouth 2 (two) times daily with a meal. Patient not  taking: Reported on 05/29/2017 06/11/16   Lily Kocher, PA-C  doxycycline (VIBRAMYCIN) 100 MG capsule Take 1 capsule (100 mg total) by mouth 2 (two) times daily. Patient not taking: Reported on 05/29/2017 06/11/16   Lily Kocher, PA-C  HYDROcodone-homatropine [facility]) 5-1.5 MG/5ML syrup Take 5 mLs by mouth every 6 (six) hours as needed. Patient not taking: Reported on 05/29/2017 06/11/16   Lily Kocher, PA-C  loratadine-pseudoephedrine (CLARITIN-D 12 HOUR) 5-120 MG tablet Take 1 tablet by mouth 2 (two) times daily. Patient not taking: Reported on 05/29/2017 06/11/16   Lily Kocher, PA-C  predniSONE (DELTASONE) 20 MG tablet 3 tabs po daily x 3 days, then 2 tabs x 3 days, then 1.5 tabs x 3  days, then 1 tab x 3 days, then 0.5 tabs x 3 days Patient not taking: Reported on 05/29/2017 10/20/15   Sherwood Gambler, MD  traMADol (ULTRAM) 50 MG tablet Take 1 tablet (50 mg total) by mouth every 6 (six) hours as needed. Patient not taking: Reported on 05/29/2017 09/19/15   [name], MD   Liver Function Tests Recent Labs  Lab 06/10/17 1816  AST 33  ALT 19  ALKPHOS 61  BILITOT 0.7  PROT 7.2  ALBUMIN 3.7   Recent Labs  Lab 06/10/17 1816  LIPASE 21   CBC Recent Labs  Lab 06/10/17 1816  WBC 11.6*  HGB 13.2  HCT 38.9  MCV 81.9  PLT 505   Basic Metabolic Panel Recent Labs  Lab 06/10/17 1816  NA 140  K 3.6  CL 108  CO2 25  GLUCOSE 86  BUN 9  CREATININE 0.88  CALCIUM 9.0     Vitals:   06/10/17 1503 06/10/17 1506 06/10/17 1930 06/10/17 2030  BP: (!) 152/102  (!) 173/93 (!) 153/71  Pulse: 64  (!) 55   Resp: 16     Temp: 98.3 F (36.8 C)     TempSrc: Oral     SpO2: 98%  98%   Weight:  93 kg (205 lb)    Height:  5\' 7"  (1.702 m)     Exam: Gen alert, AAF, no distress No rash, cyanosis or gangrene Sclera anicteric, throat clear  No jvd or bruits Chest clear bilat RRR no MRG Abd soft, obese, +tender in LLQ and SP region, no rebound, +vol guarding GU defe MS no joint effusions or deformity Ext no LE edema / no  wounds or ulcers Neuro is alert, Ox 3 , nf    Home meds: -cipro 500 bid/ flagyl 500 bid/ zofran prn    Na 140 K 3.6  BUN 9  CR 0.88  Alb 3.7  LFT's ok  WBC 11.6  Hb 13  Plt 237 UA > negative  CT abd/ pelv > Stomach, duodenum, small-bowel and cecum normal. Appendix is normal. There is an interval improvement in the inflammation surrounding the descending colon. There multiple diverticular through this region (image 49-60 of series 2). Findings consistent resolving diverticulitis. Fat inflammation decreased. No perforation or abscess. Sigmoid colon rectum normal.   Impression: 1. Improving diverticulitis of the descending colon. No  complicating features. 2.  Atherosclerotic calcification of the aorta.  (ICD10-I70.0). 3. Postcholecystectomy   Assessment: 1. Acute diverticulitis - not doing well on po abx at home. Not sure severe N/V due to po abx vs diverticulitis, suspect the former though as CT shows diverticulitis is improving.  Allergic to cephalosporin.  Plan admit, IVF's, pain/ nausea meds and IV cipro/ flagyl.  See how she does.  2. N/V -  as above     Plan - as above       Grenelefe D [facility] Pager [phone]   If 7PM-7AM, please contact night-coverage [email or web address] Password [email or web address] 06/10/2017, 9:11 PM

## 2017-06-10 NOTE — ED Notes (Signed)
Assigned time @2146  room [id number]

## 2017-06-10 NOTE — ED Notes (Signed)
EDPA Provider at bedside. 

## 2017-06-11 DIAGNOSIS — K5792 Diverticulitis of intestine, part unspecified, without perforation or abscess without bleeding: Secondary | ICD-10-CM | POA: Diagnosis present

## 2017-06-11 LAB — HIV ANTIBODY (ROUTINE TESTING W REFLEX): HIV Screen 4th Generation wRfx: NONREACTIVE

## 2017-06-11 LAB — CBC
HCT: 37.1 % (ref 36.0–46.0)
Hemoglobin: 12.5 g/dL (ref 12.0–15.0)
MCH: 27.7 pg (ref 26.0–34.0)
MCHC: 33.7 g/dL (ref 30.0–36.0)
MCV: 82.1 fL (ref 78.0–100.0)
PLATELETS: 196 10*3/uL (ref 150–400)
RBC: 4.52 MIL/uL (ref 3.87–5.11)
RDW: 14.9 % (ref 11.5–15.5)
WBC: 7.5 10*3/uL (ref 4.0–10.5)

## 2017-06-11 NOTE — Progress Notes (Signed)
PROGRESS NOTE    Christina Cannon  TDD:[id number] DOB: [birth year] DOA: 06/10/2017 PCP: Patient, No Pcp Per   Brief Narrative:  Christina Cannon is Christina Cannon 51 y.o. female with no sig PMH presented to ED on 11/7 with N/V and CT scan showing sigmoid diverticulitis. She was stable and dc'd home on po cipro 500 bid and po flagyl 500 bid.  She has continued to have severe nausea, vomiting w/ almost every meal and returns to ED today w/ this c/o. Repeat CT shows improving diverticulitis w/o perforation or abscess. Asked to see for admission.   Main c/o's are refractory N/V w/ almost every meal and also having LLQ pain which is not really better or worse.  Having night sweats and some chills, no fevers.  No CP, no SOB or dysuria.    Pt was always constipated. Now she has diarrhea from the abx.  No sig PMH, PSH had GB removed.  Was married 57 yrs, husband died last year of leukemia. NO children.  Lives w/ her sister now.  occ tobacco, no etoh.    Assessment & Plan:   Principal Problem:   Acute diverticulitis Active Problems:   Abdominal pain   Nausea & vomiting   Leukocytosis   Diverticulitis  Nausea and Vomiting:  CT with improving diverticulitis, but pt not doing well on PO abx at home.   - IV cipro/flagyl - Clear liquid diet - antiemetics/analgesics - Pt due for colonoscopy  Patient without PCP, will place care management consult  DVT prophylaxis: lovenox Code Status: full  Family Communication: none at bedside Disposition Plan: likely home pending improvement   Consultants:   none  Procedures: (Don't include imaging studies which can be auto populated. Include things that cannot be auto populated i.e. Echo, Carotid and venous dopplers, Foley, Bipap, HD, tubes/drains, wound vac, central lines etc)  none  Antimicrobials: (specify start and planned stop date. Auto populated tables are space occupying and do not give end dates) Anti-infectives (From admission, onward)   Start     Dose/Rate Route Frequency Ordered Stop   06/10/17 2345  ciprofloxacin (CIPRO) IVPB 400 mg     400 mg 200 mL/hr over 60 Minutes Intravenous 2 times daily 06/10/17 2336     06/10/17 2345  metroNIDAZOLE (FLAGYL) IVPB 500 mg     500 mg 100 mL/hr over 60 Minutes Intravenous Every 8 hours 06/10/17 2336         Subjective: N/v and abdominal pain persisted despite abx Pain about the same.  Had vomiting this morning, like orange juice.  Some stool this morning.  Pain worst in LLQ.  Objective: Vitals:   06/10/17 2230 06/10/17 2300 06/10/17 2339 06/11/17 0521  BP: (!) 143/89 (!) 144/84 (!) 152/79 132/74  Pulse: 67 66 64 (!) 58  Resp:   18 18  Temp:   98.8 F (37.1 C) 98.8 F (37.1 C)  TempSrc:   Oral Oral  SpO2: 92% 96% 96% 100%  Weight:   100.5 kg (221 lb 9 oz) 104 kg (229 lb 4.5 oz)  Height:   5\' 7"  (1.702 m)     Intake/Output Summary (Last 24 hours) at 06/11/2017 0831 Last data filed at 06/11/2017 6237 Gross per 24 hour  Intake 660 ml  Output 500 ml  Net 160 ml   Filed Weights   06/10/17 1506 06/10/17 2339 06/11/17 0521  Weight: 93 kg (205 lb) 100.5 kg (221 lb 9 oz) 104 kg (229 lb 4.5 oz)  Examination:  General exam: Appears calm and comfortable  Respiratory system: Clear to auscultation. Respiratory effort normal. Cardiovascular system: S1 & S2 heard, RRR. No JVD, murmurs, rubs, gallops or clicks. No pedal edema. Gastrointestinal system: Abdomen is diffusely tender, most in LLQ.  Quiet bowel sounds. No organomegaly or masses felt. Normal bowel sounds heard. Central nervous system: Alert and oriented. No focal neurological deficits. Extremities: No LEE.  Palpable dp pulses.  Skin: No rashes, lesions or ulcers Psychiatry: Judgement and insight appear normal. Mood & affect appropriate.     Data Reviewed: I have personally reviewed following labs and imaging studies  CBC: Recent Labs  Lab 06/10/17 1816 06/11/17 0517  WBC 11.6* 7.5  HGB 13.2 12.5  HCT  38.9 37.1  MCV 81.9 82.1  PLT 237 366   Basic Metabolic Panel: Recent Labs  Lab 06/10/17 1816  NA 140  K 3.6  CL 108  CO2 25  GLUCOSE 86  BUN 9  CREATININE 0.88  CALCIUM 9.0   GFR: Estimated Creatinine Clearance: 91.7 mL/min (by C-G formula based on SCr of 0.88 mg/dL). Liver Function Tests: Recent Labs  Lab 06/10/17 1816  AST 33  ALT 19  ALKPHOS 61  BILITOT 0.7  PROT 7.2  ALBUMIN 3.7   Recent Labs  Lab 06/10/17 1816  LIPASE 21   No results for input(s): AMMONIA in the last 168 hours. Coagulation Profile: No results for input(s): INR, PROTIME in the last 168 hours. Cardiac Enzymes: No results for input(s): CKTOTAL, CKMB, CKMBINDEX, TROPONINI in the last 168 hours. BNP (last 3 results) No results for input(s): PROBNP in the last 8760 hours. HbA1C: No results for input(s): HGBA1C in the last 72 hours. CBG: No results for input(s): GLUCAP in the last 168 hours. Lipid Profile: No results for input(s): CHOL, HDL, LDLCALC, TRIG, CHOLHDL, LDLDIRECT in the last 72 hours. Thyroid Function Tests: No results for input(s): TSH, T4TOTAL, FREET4, T3FREE, THYROIDAB in the last 72 hours. Anemia Panel: No results for input(s): VITAMINB12, FOLATE, FERRITIN, TIBC, IRON, RETICCTPCT in the last 72 hours. Sepsis Labs: No results for input(s): PROCALCITON, LATICACIDVEN in the last 168 hours.  No results found for this or any previous visit (from the past 240 hour(s)).       Radiology Studies: Ct Abdomen Pelvis W Contrast  Result Date: 06/10/2017 CLINICAL DATA:  Diverticulitis. Continued pain and nausea with vomiting EXAM: CT ABDOMEN AND PELVIS WITH CONTRAST TECHNIQUE: Multidetector CT imaging of the abdomen and pelvis was performed using the standard protocol following bolus administration of intravenous contrast. CONTRAST:  165mL ISOVUE-300 IOPAMIDOL (ISOVUE-300) INJECTION 61% COMPARISON:  CT 05/29/2017 FINDINGS: Lower chest: Lung bases are clear. Hepatobiliary: No focal  hepatic lesion. Postcholecystectomy. No biliary dilatation. Pancreas: Pancreas is normal. No ductal dilatation. No pancreatic inflammation. Small fat lesion in tail the pancreas measuring 9 mm is unchanged. Spleen: Normal spleen Adrenals/urinary tract: Adrenal glands and kidneys are normal. The ureters and bladder normal. Stomach/Bowel: Stomach, duodenum, small-bowel and cecum normal. Appendix is normal. There is an interval improvement in the inflammation surrounding the descending colon. There multiple diverticular through this region (image 49-60 of series 2). Findings consistent resolving diverticulitis. Fat inflammation decreased. No perforation or abscess. Sigmoid colon rectum normal Vascular/Lymphatic: Abdominal aorta is normal caliber with atherosclerotic calcification. There is no retroperitoneal or periportal lymphadenopathy. No pelvic lymphadenopathy. Reproductive: Uterus and ovaries unremarkable. Probable leiomyoma extending from the posterior aspect the lower uterine segment Other: No free fluid. Musculoskeletal: No aggressive osseous lesion. IMPRESSION: 1. Improving diverticulitis of  the descending colon. No complicating features. 2.  Atherosclerotic calcification of the aorta.  (ICD10-I70.0). 3. Postcholecystectomy Electronically Signed   By: Suzy Bouchard M.D.   On: 06/10/2017 20:29        Scheduled Meds: . enoxaparin (LOVENOX) injection  40 mg Subcutaneous QHS   Continuous Infusions: . ciprofloxacin Stopped (06/11/17 0056)  . dextrose 5 % and 0.45% NaCl 1,000 mL (06/10/17 2357)  . metronidazole 500 mg (06/11/17 0527)     LOS: 0 days    Time spent: over 30 minutes    Fayrene Helper, MD [facility] Pager [phone]   If 7PM-7AM, please contact night-coverage [email or web address] Password [facility] 06/11/2017, 8:31 AM

## 2017-06-11 NOTE — Progress Notes (Signed)
Spoke with patient and family at bedside. Discussed need for PCP and follow up care. She is agreeable. Provided patient with resources to find PCP, uninsured resources such as [employer] (she is familiar with this and has used it). Offered to schedule appt for her but she says she would do it. Encourage her to do that asap. She asked about applying for Medicaid, told her once she could contact SS office at any time to start application process. She has good family support. No further issues or concerns. [phone]

## 2017-06-12 LAB — CBC
HCT: 35.8 % — ABNORMAL LOW (ref 36.0–46.0)
HEMOGLOBIN: 12.1 g/dL (ref 12.0–15.0)
MCH: 27.6 pg (ref 26.0–34.0)
MCHC: 33.8 g/dL (ref 30.0–36.0)
MCV: 81.5 fL (ref 78.0–100.0)
Platelets: 197 10*3/uL (ref 150–400)
RBC: 4.39 MIL/uL (ref 3.87–5.11)
RDW: 14.6 % (ref 11.5–15.5)
WBC: 8.2 10*3/uL (ref 4.0–10.5)

## 2017-06-12 LAB — COMPREHENSIVE METABOLIC PANEL
ALBUMIN: 3.2 g/dL — AB (ref 3.5–5.0)
ALK PHOS: 55 U/L (ref 38–126)
ALT: 32 U/L (ref 14–54)
AST: 43 U/L — AB (ref 15–41)
Anion gap: 5 (ref 5–15)
BUN: 6 mg/dL (ref 6–20)
CALCIUM: 8.2 mg/dL — AB (ref 8.9–10.3)
CHLORIDE: 107 mmol/L (ref 101–111)
CO2: 25 mmol/L (ref 22–32)
CREATININE: 0.97 mg/dL (ref 0.44–1.00)
GFR calc Af Amer: >60 mL/min (ref >60)
GFR calc non Af Amer: >60 mL/min (ref >60)
GLUCOSE: 116 mg/dL — AB (ref 65–99)
Potassium: 3.1 mmol/L — ABNORMAL LOW (ref 3.5–5.1)
SODIUM: 137 mmol/L (ref 135–145)
Total Bilirubin: 0.5 mg/dL (ref 0.3–1.2)
Total Protein: 6.6 g/dL (ref 6.5–8.1)

## 2017-06-12 MED ORDER — AMOXICILLIN-POT CLAVULANATE 875-125 MG PO TABS
1.0000 | ORAL_TABLET | Freq: Two times a day (BID) | ORAL | Status: DC
  Administered 2017-06-12 – 2017-06-13 (×3): 1 via ORAL
  Filled 2017-06-12 (×3): qty 1

## 2017-06-12 NOTE — Progress Notes (Signed)
PROGRESS NOTE    Christina Cannon  DQQ:[id number] DOB: Nov 30, 1965 DOA: 06/10/2017 PCP: Patient, No Pcp Per   Brief Narrative:  Christina Cannon is a 51 y.o. female with no sig PMH presented to ED on 11/7 with N/V and CT scan showing sigmoid diverticulitis. She was stable and dc'd home on po cipro 500 bid and po flagyl 500 bid.  She has continued to have severe nausea, vomiting w/ almost every meal and returns to ED today w/ this c/o. Repeat CT shows improving diverticulitis w/o perforation or abscess.  Assessment & Plan:   Diverticulitis /Nausea and Vomiting:  -CT notes improving diverticulitis no fevers or like leukocytosis is noted -Suspect her symptoms are primarily secondary to IV Flagyl -Will transition to oral Augmentin and monitor, she has this history of allergy to cefuroxime, however does not recall if she is ever used penicillin in the past, case discussed with PharmD, since cross-reactivity between penicillin and ceftriaxone is very low,  about 1% or less  -Advance diet, continue supportive care -Patient is due for colonoscopy and will need GI follow-up after discharge  Nausea and vomiting -As above  DVT prophylaxis: lovenox Code Status: full  Family Communication: none at bedside  Disposition Plan: Home tomorrow if stable    s:   Antimicrobials: Cipro Flagyl Anti-infectives (From admission, onward)   Start     Dose/Rate Route Frequency Ordered Stop   06/12/17 1130  amoxicillin-clavulanate (AUGMENTIN) 875-125 MG per tablet 1 tablet     1 tablet Oral Every 12 hours 06/12/17 1055     06/10/17 2345  ciprofloxacin (CIPRO) IVPB 400 mg  Status:  Discontinued     400 mg 200 mL/hr over 60 Minutes Intravenous 2 times daily 06/10/17 2336 06/12/17 1055   06/10/17 2345  metroNIDAZOLE (FLAGYL) IVPB 500 mg  Status:  Discontinued     500 mg 100 mL/hr over 60 Minutes Intravenous Every 8 hours 06/10/17 2336 06/12/17 1055       Subjective: -IV infiltrated and did not  get IV Flagyl this morning, reports that her nausea is better as she didn't get the Flagyl dose   Objective: Vitals:   06/11/17 1346 06/11/17 2159 06/12/17 0547 06/12/17 1301  BP: (!) 159/76 (!) 148/80 (!) 153/84   Pulse: 62 (!) 55 67   Resp: 15 18 18    Temp: 98.5 F (36.9 C) 98.7 F (37.1 C) 98.8 F (37.1 C) 99.3 F (37.4 C)  TempSrc: Oral Oral Oral   SpO2: 100% 99% 98%   Weight:   102.3 kg (225 lb 8.5 oz)   Height:        Intake/Output Summary (Last 24 hours) at 06/12/2017 1332 Last data filed at 06/12/2017 1000 Gross per 24 hour  Intake 3860 ml  Output 0 ml  Net 3860 ml   Filed Weights   06/10/17 2339 06/11/17 0521 06/12/17 0547  Weight: 100.5 kg (221 lb 9 oz) 104 kg (229 lb 4.5 oz) 102.3 kg (225 lb 8.5 oz)    Examination:  Gen: Awake, Alert, Oriented X 3,  HEENT: PERRLA, Neck supple, no JVD Lungs: Good air movement bilaterally, CTAB CVS: RRR,No Gallops,Rubs or new Murmurs Abd: soft, Non tender, non distended, BS present Extremities: No Cyanosis, Clubbing or edema Skin: no new rashes    Data Reviewed: I have personally reviewed following labs and imaging studies  CBC: Recent Labs  Lab 06/10/17 1816 06/11/17 0517 06/12/17 0604  WBC 11.6* 7.5 8.2  HGB 13.2 12.5 12.1  HCT 38.9  37.1 35.8*  MCV 81.9 82.1 81.5  PLT 237 196 782   Basic Metabolic Panel: Recent Labs  Lab 06/10/17 1816 06/12/17 0604  NA 140 137  K 3.6 3.1*  CL 108 107  CO2 25 25  GLUCOSE 86 116*  BUN 9 6  CREATININE 0.88 0.97  CALCIUM 9.0 8.2*   GFR: Estimated Creatinine Clearance: 82.5 mL/min (by C-G formula based on SCr of 0.97 mg/dL). Liver Function Tests: Recent Labs  Lab 06/10/17 1816 06/12/17 0604  AST 33 43*  ALT 19 32  ALKPHOS 61 55  BILITOT 0.7 0.5  PROT 7.2 6.6  ALBUMIN 3.7 3.2*   Recent Labs  Lab 06/10/17 1816  LIPASE 21   No results for input(s): AMMONIA in the last 168 hours. Coagulation Profile: No results for input(s): INR, PROTIME in the last 168  hours. Cardiac Enzymes: No results for input(s): CKTOTAL, CKMB, CKMBINDEX, TROPONINI in the last 168 hours. BNP (last 3 results) No results for input(s): PROBNP in the last 8760 hours. HbA1C: No results for input(s): HGBA1C in the last 72 hours. CBG: No results for input(s): GLUCAP in the last 168 hours. Lipid Profile: No results for input(s): CHOL, HDL, LDLCALC, TRIG, CHOLHDL, LDLDIRECT in the last 72 hours. Thyroid Function Tests: No results for input(s): TSH, T4TOTAL, FREET4, T3FREE, THYROIDAB in the last 72 hours. Anemia Panel: No results for input(s): VITAMINB12, FOLATE, FERRITIN, TIBC, IRON, RETICCTPCT in the last 72 hours. Sepsis Labs: No results for input(s): PROCALCITON, LATICACIDVEN in the last 168 hours.  No results found for this or any previous visit (from the past 240 hour(s)).       Radiology Studies: Ct Abdomen Pelvis W Contrast  Result Date: 06/10/2017 CLINICAL DATA:  Diverticulitis. Continued pain and nausea with vomiting EXAM: CT ABDOMEN AND PELVIS WITH CONTRAST TECHNIQUE: Multidetector CT imaging of the abdomen and pelvis was performed using the standard protocol following bolus administration of intravenous contrast. CONTRAST:  170mL ISOVUE-300 IOPAMIDOL (ISOVUE-300) INJECTION 61% COMPARISON:  CT 05/29/2017 FINDINGS: Lower chest: Lung bases are clear. Hepatobiliary: No focal hepatic lesion. Postcholecystectomy. No biliary dilatation. Pancreas: Pancreas is normal. No ductal dilatation. No pancreatic inflammation. Small fat lesion in tail the pancreas measuring 9 mm is unchanged. Spleen: Normal spleen Adrenals/urinary tract: Adrenal glands and kidneys are normal. The ureters and bladder normal. Stomach/Bowel: Stomach, duodenum, small-bowel and cecum normal. Appendix is normal. There is an interval improvement in the inflammation surrounding the descending colon. There multiple diverticular through this region (image 49-60 of series 2). Findings consistent resolving  diverticulitis. Fat inflammation decreased. No perforation or abscess. Sigmoid colon rectum normal Vascular/Lymphatic: Abdominal aorta is normal caliber with atherosclerotic calcification. There is no retroperitoneal or periportal lymphadenopathy. No pelvic lymphadenopathy. Reproductive: Uterus and ovaries unremarkable. Probable leiomyoma extending from the posterior aspect the lower uterine segment Other: No free fluid. Musculoskeletal: No aggressive osseous lesion. IMPRESSION: 1. Improving diverticulitis of the descending colon. No complicating features. 2.  Atherosclerotic calcification of the aorta.  (ICD10-I70.0). 3. Postcholecystectomy Electronically Signed   By: Suzy Bouchard M.D.   On: 06/10/2017 20:29        Scheduled Meds: . amoxicillin-clavulanate  1 tablet Oral Q12H  . enoxaparin (LOVENOX) injection  40 mg Subcutaneous QHS   Continuous Infusions: . dextrose 5 % and 0.45% NaCl 50 mL/hr at 06/12/17 1056     LOS: 1 day    Time spent: over 30 minutes    Domenic Polite, MD [facility] Page via [email or web address], password [email or web address]   If 7PM-7AM,  please contact night-coverage [email or web address] Password [email or web address] 06/12/2017, 1:32 PM

## 2017-06-13 MED ORDER — ONDANSETRON 4 MG PO TBDP: 4.0000 mg | ORAL_TABLET | Freq: Three times a day (TID) | ORAL | 0 refills | Status: DC | PRN

## 2017-06-13 MED ORDER — AMOXICILLIN-POT CLAVULANATE 875-125 MG PO TABS: 1.0000 | ORAL_TABLET | Freq: Two times a day (BID) | ORAL | 0 refills | Status: DC

## 2017-06-13 NOTE — Discharge Summary (Signed)
Physician Discharge Summary  Christina Cannon:[id number] DOB: [birth year] DOA: 06/10/2017  PCP: Patient, No Pcp Per  Admit date: 06/10/2017 Discharge date: 06/13/2017  Time spent: 45 minutes  Recommendations for Outpatient Follow-up:  1. PCP in 1 week, seen by CM and resources given for PCP, PT has been advised to FU with GI for screening colonoscopy   Discharge Diagnoses:  Principal Problem:   Acute diverticulitis Active Problems:   Abdominal pain   Nausea & vomiting   Leukocytosis   Diverticulitis   Discharge Condition: improved  Diet recommendation: regular  Filed Weights   06/11/17 0521 06/12/17 0547 06/13/17 0529  Weight: 104 kg (229 lb 4.5 oz) 102.3 kg (225 lb 8.5 oz) 99.6 kg (219 lb 9.3 oz)    History of present illness:  Christina Hirschfeld Hendersonis a 51 y.o.femalewithno sig PMH presented to ED on 11/7 with N/V and CT scan showing sigmoid diverticulitis. She was stable and dc'd home on po cipro 500 bid and po flagyl 500 bid. She has continued to have severe nausea, vomiting w/ almost every meal and returned to ED w/ this c/o. Repeat CT shows improving diverticulitis w/o perforation or abscess  Hospital Course:   Diverticulitis Christina Cannon and Vomiting:  -CT notes improving diverticulitis no fevers orleukocytosis noted -Suspected her symptoms are primarily secondary to Flagyl -she has this history of allergy to cefuroxime, however does not recall if she is ever used penicillin in the past, since cross-reactivity between penicillin and ceftriaxone is very low- less than 1%, we challenged her with Augmentin inpatient, she was able to tolerate this well without any symptoms  -improved and tolerating diet at discharge -Patient is due for colonoscopy and will need GI follow-up after discharge  Nausea and vomiting -As above  Discharge Exam: Vitals:   06/12/17 2209 06/13/17 0529  BP: 139/84 (!) 145/83  Pulse: 64 64  Resp: 18 18  Temp: 98.5 F (36.9 C) 98.5 F  (36.9 C)  SpO2: 100% 98%    General: AAOx3 Cardiovascular: S1S2/RRR Respiratory: CTAB  Discharge Instructions   Discharge Instructions    Diet - low sodium heart healthy   Complete by:  As directed    Increase activity slowly   Complete by:  As directed      Discharge Medication List as of 06/13/2017  9:17 AM    START taking these medications   Details  amoxicillin-clavulanate (AUGMENTIN) 875-125 MG tablet Take 1 tablet by mouth every 12 (twelve) hours. For 6days, Starting Thu 06/13/2017, Print      CONTINUE these medications which have CHANGED   Details  ondansetron (ZOFRAN ODT) 4 MG disintegrating tablet Take 1 tablet (4 mg total) by mouth every 8 (eight) hours as needed for nausea or vomiting., Starting Thu 06/13/2017, Print      CONTINUE these medications which have NOT CHANGED   Details  HYDROcodone-acetaminophen (NORCO/VICODIN) 5-325 MG tablet Take 2 tablets every 4 (four) hours as needed by mouth., Starting Thu 05/30/2017, Print    acetaminophen (TYLENOL) 500 MG tablet Take 500 mg every 6 (six) hours as needed by mouth (for pain)., Historical Med    aspirin 325 MG EC tablet Take 325 mg every 6 (six) hours as needed by mouth for pain. , Historical Med      STOP taking these medications     ciprofloxacin (CIPRO) 500 MG tablet      metroNIDAZOLE (FLAGYL) 500 MG tablet        Allergies  Allergen Reactions  . Ceftin [Cefuroxime Axetil]  Anaphylaxis and Swelling    Throat and face swell      The results of significant diagnostics from this hospitalization (including imaging, microbiology, ancillary and laboratory) are listed below for reference.    Significant Diagnostic Studies: Ct Abdomen Pelvis W Contrast  Result Date: 06/10/2017 CLINICAL DATA:  Diverticulitis. Continued pain and nausea with vomiting EXAM: CT ABDOMEN AND PELVIS WITH CONTRAST TECHNIQUE: Multidetector CT imaging of the abdomen and pelvis was performed using the standard protocol following  bolus administration of intravenous contrast. CONTRAST:  121mL ISOVUE-300 IOPAMIDOL (ISOVUE-300) INJECTION 61% COMPARISON:  CT 05/29/2017 FINDINGS: Lower chest: Lung bases are clear. Hepatobiliary: No focal hepatic lesion. Postcholecystectomy. No biliary dilatation. Pancreas: Pancreas is normal. No ductal dilatation. No pancreatic inflammation. Small fat lesion in tail the pancreas measuring 9 mm is unchanged. Spleen: Normal spleen Adrenals/urinary tract: Adrenal glands and kidneys are normal. The ureters and bladder normal. Stomach/Bowel: Stomach, duodenum, small-bowel and cecum normal. Appendix is normal. There is an interval improvement in the inflammation surrounding the descending colon. There multiple diverticular through this region (image 49-60 of series 2). Findings consistent resolving diverticulitis. Fat inflammation decreased. No perforation or abscess. Sigmoid colon rectum normal Vascular/Lymphatic: Abdominal aorta is normal caliber with atherosclerotic calcification. There is no retroperitoneal or periportal lymphadenopathy. No pelvic lymphadenopathy. Reproductive: Uterus and ovaries unremarkable. Probable leiomyoma extending from the posterior aspect the lower uterine segment Other: No free fluid. Musculoskeletal: No aggressive osseous lesion. IMPRESSION: 1. Improving diverticulitis of the descending colon. No complicating features. 2.  Atherosclerotic calcification of the aorta.  (ICD10-I70.0). 3. Postcholecystectomy Electronically Signed   By: Suzy Bouchard M.D.   On: 06/10/2017 20:29   Ct Abdomen Pelvis W Contrast  Result Date: 05/30/2017 CLINICAL DATA:  Abdominal pain EXAM: CT ABDOMEN AND PELVIS WITH CONTRAST TECHNIQUE: Multidetector CT imaging of the abdomen and pelvis was performed using the standard protocol following bolus administration of intravenous contrast. CONTRAST:  127mL ISOVUE-300 IOPAMIDOL (ISOVUE-300) INJECTION 61% COMPARISON:  None. FINDINGS: Lower chest: No pulmonary  nodules or pleural effusion. No visible pericardial effusion. Hepatobiliary: Normal hepatic contours and density. No visible biliary dilatation. Status post cholecystectomy. Pancreas: Normal contours without ductal dilatation. No peripancreatic fluid collection. Spleen: Normal. Adrenals/Urinary Tract: --Adrenal glands: Normal. --Right kidney/ureter: No hydronephrosis or perinephric stranding. No nephrolithiasis. No obstructing ureteral stones. --Left kidney/ureter: No hydronephrosis or perinephric stranding. No nephrolithiasis. No obstructing ureteral stones. --Urinary bladder: Unremarkable. Stomach/Bowel: --Stomach/Duodenum: No hiatal hernia or other gastric abnormality. Normal duodenal course and caliber. --Small bowel: No dilatation or inflammation. --Colon: There is rectosigmoid diverticulosis with mild inflammatory change of the surrounding fat of the proximal sigmoid colon. --Appendix: Normal. Vascular/Lymphatic: Atherosclerotic calcification is present within the non-aneurysmal abdominal aorta, without hemodynamically significant stenosis. No abdominal or pelvic lymphadenopathy. Reproductive: Normal uterus and ovaries.  Bartholin gland cyst. Musculoskeletal. Multilevel degenerative disc disease and facet arthrosis. No bony spinal canal stenosis. Other: None. IMPRESSION: 1. Uncomplicated proximal sigmoid colon diverticulitis without free intraperitoneal air or abscess/fluid collection. 2.  Aortic Atherosclerosis (ICD10-I70.0). Electronically Signed   By: Ulyses Jarred M.D.   On: 05/30/2017 00:01    Microbiology: No results found for this or any previous visit (from the past 240 hour(s)).   Labs: Basic Metabolic Panel: Recent Labs  Lab 06/10/17 1816 06/12/17 0604  NA 140 137  K 3.6 3.1*  CL 108 107  CO2 25 25  GLUCOSE 86 116*  BUN 9 6  CREATININE 0.88 0.97  CALCIUM 9.0 8.2*   Liver Function Tests: Recent Labs  Lab 06/10/17  1816 06/12/17 0604  AST 33 43*  ALT 19 32  ALKPHOS 61 55   BILITOT 0.7 0.5  PROT 7.2 6.6  ALBUMIN 3.7 3.2*   Recent Labs  Lab 06/10/17 1816  LIPASE 21   No results for input(s): AMMONIA in the last 168 hours. CBC: Recent Labs  Lab 06/10/17 1816 06/11/17 0517 06/12/17 0604  WBC 11.6* 7.5 8.2  HGB 13.2 12.5 12.1  HCT 38.9 37.1 35.8*  MCV 81.9 82.1 81.5  PLT 237 196 197   Cardiac Enzymes: No results for input(s): CKTOTAL, CKMB, CKMBINDEX, TROPONINI in the last 168 hours. BNP: BNP (last 3 results) No results for input(s): BNP in the last 8760 hours.  ProBNP (last 3 results) No results for input(s): PROBNP in the last 8760 hours.  CBG: No results for input(s): GLUCAP in the last 168 hours.     Signed:  Domenic Polite MD.  [facility] 06/13/2017, 12:37 PM

## 2017-06-13 NOTE — Progress Notes (Signed)
Discharge instructions reviewed with patient. All questions answered. Patient wheeled down to main entrance with belongings by nurse tech.

## 2017-06-19 ENCOUNTER — Encounter (HOSPITAL_COMMUNITY): Payer: Self-pay | Admitting: Family Medicine

## 2017-06-19 DIAGNOSIS — R1031 Right lower quadrant pain: Secondary | ICD-10-CM | POA: Insufficient documentation

## 2017-06-19 DIAGNOSIS — Z87891 Personal history of nicotine dependence: Secondary | ICD-10-CM | POA: Insufficient documentation

## 2017-06-19 DIAGNOSIS — R1110 Vomiting, unspecified: Secondary | ICD-10-CM | POA: Insufficient documentation

## 2017-06-19 DIAGNOSIS — R1032 Left lower quadrant pain: Secondary | ICD-10-CM | POA: Insufficient documentation

## 2017-06-19 DIAGNOSIS — Z7982 Long term (current) use of aspirin: Secondary | ICD-10-CM | POA: Insufficient documentation

## 2017-06-19 DIAGNOSIS — Z8719 Personal history of other diseases of the digestive system: Secondary | ICD-10-CM | POA: Insufficient documentation

## 2017-06-19 DIAGNOSIS — Z79899 Other long term (current) drug therapy: Secondary | ICD-10-CM | POA: Insufficient documentation

## 2017-06-19 NOTE — ED Triage Notes (Signed)
Patient is complaining about lower and epigastric abd pain with nausea and vomiting. Associated symptoms are diarrhea, loss of appetite, gas, cramping, bloating, and distention. She was discharged on 06/13/2017 for diverticulitis and had the symptoms since but got worse today around 16:00.

## 2017-06-19 NOTE — ED Notes (Signed)
Patient request lab draw once she is placed in a treatment room.

## 2017-06-20 ENCOUNTER — Emergency Department (HOSPITAL_COMMUNITY)
Admission: EM | Admit: 2017-06-20 | Discharge: 2017-06-20 | Disposition: A | Discharge status: Home / Self Care | Payer: Self-pay | Attending: Emergency Medicine | Admitting: Emergency Medicine

## 2017-06-20 ENCOUNTER — Emergency Department (HOSPITAL_COMMUNITY): Payer: Self-pay

## 2017-06-20 ENCOUNTER — Encounter (HOSPITAL_COMMUNITY): Payer: Self-pay

## 2017-06-20 DIAGNOSIS — Z8719 Personal history of other diseases of the digestive system: Secondary | ICD-10-CM

## 2017-06-20 DIAGNOSIS — R1032 Left lower quadrant pain: Secondary | ICD-10-CM

## 2017-06-20 LAB — COMPREHENSIVE METABOLIC PANEL
ALBUMIN: 3.9 g/dL (ref 3.5–5.0)
ALK PHOS: 58 U/L (ref 38–126)
ALT: 15 U/L (ref 14–54)
ANION GAP: 9 (ref 5–15)
AST: 22 U/L (ref 15–41)
BILIRUBIN TOTAL: 1.2 mg/dL (ref 0.3–1.2)
BUN: 10 mg/dL (ref 6–20)
CALCIUM: 9.2 mg/dL (ref 8.9–10.3)
CO2: 20 mmol/L — AB (ref 22–32)
Chloride: 107 mmol/L (ref 101–111)
Creatinine, Ser: 0.92 mg/dL (ref 0.44–1.00)
GFR calc Af Amer: >60 mL/min (ref >60)
GFR calc non Af Amer: >60 mL/min (ref >60)
GLUCOSE: 107 mg/dL — AB (ref 65–99)
POTASSIUM: 3.8 mmol/L (ref 3.5–5.1)
SODIUM: 136 mmol/L (ref 135–145)
Total Protein: 7.3 g/dL (ref 6.5–8.1)

## 2017-06-20 LAB — URINALYSIS, ROUTINE W REFLEX MICROSCOPIC
BACTERIA UA: NONE SEEN
Bilirubin Urine: NEGATIVE
Glucose, UA: NEGATIVE mg/dL
Ketones, ur: NEGATIVE mg/dL
Leukocytes, UA: NEGATIVE
Nitrite: NEGATIVE
PROTEIN: NEGATIVE mg/dL
SPECIFIC GRAVITY, URINE: 1.016 (ref 1.005–1.030)
pH: 7 (ref 5.0–8.0)

## 2017-06-20 LAB — CBC
HEMATOCRIT: 39.2 % (ref 36.0–46.0)
HEMOGLOBIN: 13.7 g/dL (ref 12.0–15.0)
MCH: 28.1 pg (ref 26.0–34.0)
MCHC: 34.9 g/dL (ref 30.0–36.0)
MCV: 80.5 fL (ref 78.0–100.0)
Platelets: 278 10*3/uL (ref 150–400)
RBC: 4.87 MIL/uL (ref 3.87–5.11)
RDW: 14.8 % (ref 11.5–15.5)
WBC: 13.5 10*3/uL — ABNORMAL HIGH (ref 4.0–10.5)

## 2017-06-20 LAB — LIPASE, BLOOD: Lipase: 19 U/L (ref 11–51)

## 2017-06-20 LAB — I-STAT BETA HCG BLOOD, ED (MC, WL, AP ONLY)

## 2017-06-20 MED ORDER — MORPHINE SULFATE (PF) 4 MG/ML IV SOLN
4.0000 mg | Freq: Once | INTRAVENOUS | Status: AC
  Administered 2017-06-20: 4 mg via INTRAVENOUS
  Filled 2017-06-20: qty 1

## 2017-06-20 MED ORDER — ONDANSETRON 8 MG PO TBDP: ORAL_TABLET | ORAL | 0 refills | Status: DC

## 2017-06-20 MED ORDER — IOPAMIDOL (ISOVUE-300) INJECTION 61%
100.0000 mL | Freq: Once | INTRAVENOUS | Status: AC | PRN
  Administered 2017-06-20: 100 mL via INTRAVENOUS

## 2017-06-20 MED ORDER — ONDANSETRON HCL 4 MG/2ML IJ SOLN
4.0000 mg | Freq: Once | INTRAMUSCULAR | Status: AC
  Administered 2017-06-20: 4 mg via INTRAVENOUS
  Filled 2017-06-20: qty 2

## 2017-06-20 MED ORDER — IOPAMIDOL (ISOVUE-300) INJECTION 61%
INTRAVENOUS | Status: AC
  Administered 2017-06-20: 100 mL via INTRAVENOUS
  Filled 2017-06-20: qty 100

## 2017-06-20 MED ORDER — HYDROCODONE-ACETAMINOPHEN 5-325 MG PO TABS: 1.0000 | ORAL_TABLET | Freq: Four times a day (QID) | ORAL | 0 refills | Status: DC | PRN

## 2017-06-20 MED ORDER — SODIUM CHLORIDE 0.9 % IV BOLUS (SEPSIS)
1000.0000 mL | Freq: Once | INTRAVENOUS | Status: AC
  Administered 2017-06-20: 1000 mL via INTRAVENOUS

## 2017-06-20 NOTE — Discharge Instructions (Signed)
Hydrocodone is prescribed as needed for pain.  Zofran as prescribed as needed for nausea.  Follow-up with your primary doctor if you are not improving in the next 3-4 days, and return to the ER if you develop worsening pain, bloody stools, high fevers, or other new and concerning symptoms.

## 2017-06-20 NOTE — ED Provider Notes (Signed)
[facility] DEPT Provider Note   CSN: [id number] Arrival date & time: 06/19/17  2116     History   Chief Complaint Chief Complaint  Patient presents with  . Abdominal Pain  . Emesis    HPI Christina Cannon is a 51 y.o. female.  The patient is a 51 year old female with history of diverticulitis, cholecystectomy presenting for evaluation of lower abdominal pain.  She was recently admitted for diverticulitis.  She is currently taking Augmentin and states she has 3 doses left.  Her pain began to worsen again over the past 2 days.  She denies any fevers or bloody stool.  She does report having episodes of vomiting.   The history is provided by the patient.  Abdominal Pain   This is a recurrent problem. The current episode started yesterday. The problem occurs constantly. The problem has been rapidly worsening. The pain is located in the RLQ and LLQ. The quality of the pain is cramping. The pain is moderate. Associated symptoms include vomiting. Pertinent negatives include fever, melena, nausea and dysuria. The symptoms are aggravated by eating and palpation. Nothing relieves the symptoms.  Emesis   Associated symptoms include abdominal pain. Pertinent negatives include no fever.    Past Medical History:  Diagnosis Date  . Diverticulitis     Patient Active Problem List   Diagnosis Date Noted  . Diverticulitis 06/11/2017  . Acute diverticulitis 06/10/2017  . Abdominal pain 06/10/2017  . Nausea & vomiting 06/10/2017  . Leukocytosis 06/10/2017  . 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 Skin tag of vulva 08/10/2015  . Encounter for annual physical exam 08/10/2015  . UTI (lower urinary tract infection) 08/10/2015    Past Surgical History:  Procedure Laterality Date  . CHOLECYSTECTOMY    . HAND SURGERY      OB History    No data available       Home Medications    Prior to Admission medications   Medication Sig Start Date End Date  Taking? Authorizing Provider  acetaminophen (TYLENOL) 500 MG tablet Take 500 mg every 6 (six) hours as needed by mouth (for pain).    [provider]  amoxicillin-clavulanate (AUGMENTIN) 875-125 MG tablet Take 1 tablet by mouth every 12 (twelve) hours. For 6days 06/13/17   Domenic Polite, MD  aspirin 325 MG EC tablet Take 325 mg every 6 (six) hours as needed by mouth for pain.     [provider]  HYDROcodone-acetaminophen (NORCO/VICODIN) 5-325 MG tablet Take 2 tablets every 4 (four) hours as needed by mouth. Patient taking differently: Take 2 tablets every 4 (four) hours as needed by mouth for moderate pain or severe pain.  05/30/17   Fransico Meadow, PA-C  ondansetron (ZOFRAN ODT) 4 MG disintegrating tablet Take 1 tablet (4 mg total) by mouth every 8 (eight) hours as needed for nausea or vomiting. 06/13/17   Domenic Polite, MD    Family History Family History  Problem Relation Age of Onset  . Diabetes Mother   . Hypertension Mother   . Diabetes Father   . Hypertension Father   . Heart failure Father     Social History Social History   Tobacco Use  . Smoking status: Former Smoker    Packs/day: 0.50    Types: Cigarettes  . Smokeless tobacco: Never Used  Substance Use Topics  . Alcohol use: No  . Drug use: No    Comment: Prior use     Allergies   Ceftin [cefuroxime  axetil]   Review of Systems Review of Systems  Constitutional: Negative for fever.  Gastrointestinal: Positive for abdominal pain and vomiting. Negative for melena and nausea.  Genitourinary: Negative for dysuria.  All other systems reviewed and are negative.    Physical Exam Updated Vital Signs BP (!) 150/91 (BP Location: Left Arm)   Pulse 81   Temp 99.1 F (37.3 C) (Oral)   Resp 20   Ht 5\' 7"  (1.702 m)   Wt 93 kg (205 lb)   SpO2 97%   BMI 32.11 kg/m   Physical Exam  Constitutional: She is oriented to person, place, and time. She appears well-developed and well-nourished. No  distress.  HENT:  Head: Normocephalic and atraumatic.  Neck: Normal range of motion. Neck supple.  Cardiovascular: Normal rate and regular rhythm. Exam reveals no gallop and no friction rub.  No murmur heard. Pulmonary/Chest: Effort normal and breath sounds normal. No respiratory distress. She has no wheezes.  Abdominal: Soft. Bowel sounds are normal. She exhibits no distension. There is tenderness in the right lower quadrant, suprapubic area and left lower quadrant.  There is tenderness to palpation across the lower abdomen.  There is voluntary guarding, however no rebound or rigidity.  Musculoskeletal: Normal range of motion.  Neurological: She is alert and oriented to person, place, and time.  Skin: Skin is warm and dry. She is not diaphoretic.  Nursing note and vitals reviewed.    ED Treatments / Results  Labs (all labs ordered are listed, but only abnormal results are displayed) Labs Reviewed  LIPASE, BLOOD  COMPREHENSIVE METABOLIC PANEL  CBC  URINALYSIS, ROUTINE W REFLEX MICROSCOPIC  I-STAT BETA HCG BLOOD, ED ([facility], [facility], [facility] ONLY)    EKG  EKG Interpretation None       Radiology No results found.  Procedures Procedures (including critical care time)  Medications Ordered in ED Medications  sodium chloride 0.9 % bolus 1,000 mL (not administered)  ondansetron (ZOFRAN) injection 4 mg (not administered)  morphine 4 MG/ML injection 4 mg (not administered)     Initial Impression / Assessment and Plan / ED Course  I have reviewed the triage vital signs and the nursing notes.  Pertinent labs & imaging results that were available during my care of the patient were reviewed by me and considered in my medical decision making (see chart for details).  Patient feeling better after medications given in the ER.  She does have a white count of 13,000, however her CT scan shows interval improvement with no abscess or perforation or other complicating factors.  She is to be  discharged with medicine for pain and nausea and follow-up with primary doctor if not improving.  Final Clinical Impressions(s) / ED Diagnoses   Final diagnoses:  None    ED Discharge Orders    None       Veryl Speak, MD 06/20/17 [phone]

## 2017-06-20 NOTE — ED Notes (Signed)
Bed: [facility] Expected date:  Expected time:  Means of arrival:  Comments: 

## 2017-07-09 ENCOUNTER — Encounter: Payer: Self-pay | Admitting: Internal Medicine

## 2017-07-09 ENCOUNTER — Ambulatory Visit: Payer: Self-pay | Attending: Internal Medicine | Admitting: Internal Medicine

## 2017-07-09 VITALS — BP 138/82 | HR 68 | Temp 98.3°F | Resp 16 | Wt 223.6 lb

## 2017-07-09 DIAGNOSIS — M19071 Primary osteoarthritis, right ankle and foot: Secondary | ICD-10-CM

## 2017-07-09 DIAGNOSIS — F17210 Nicotine dependence, cigarettes, uncomplicated: Secondary | ICD-10-CM | POA: Insufficient documentation

## 2017-07-09 DIAGNOSIS — R030 Elevated blood-pressure reading, without diagnosis of hypertension: Secondary | ICD-10-CM | POA: Insufficient documentation

## 2017-07-09 DIAGNOSIS — M170 Bilateral primary osteoarthritis of knee: Secondary | ICD-10-CM

## 2017-07-09 DIAGNOSIS — Z1239 Encounter for other screening for malignant neoplasm of breast: Secondary | ICD-10-CM

## 2017-07-09 DIAGNOSIS — Z23 Encounter for immunization: Secondary | ICD-10-CM

## 2017-07-09 DIAGNOSIS — Z1231 Encounter for screening mammogram for malignant neoplasm of breast: Secondary | ICD-10-CM

## 2017-07-09 DIAGNOSIS — M19072 Primary osteoarthritis, left ankle and foot: Secondary | ICD-10-CM

## 2017-07-09 DIAGNOSIS — Z8719 Personal history of other diseases of the digestive system: Secondary | ICD-10-CM | POA: Insufficient documentation

## 2017-07-09 DIAGNOSIS — F17200 Nicotine dependence, unspecified, uncomplicated: Secondary | ICD-10-CM | POA: Insufficient documentation

## 2017-07-09 DIAGNOSIS — L98491 Non-pressure chronic ulcer of skin of other sites limited to breakdown of skin: Secondary | ICD-10-CM

## 2017-07-09 MED ORDER — CELECOXIB 200 MG PO CAPS: 200.0000 mg | ORAL_CAPSULE | Freq: Every day | ORAL | 4 refills | Status: DC

## 2017-07-09 MED FILL — ?CELECOXIB 200 MG CAPSULE: 200 | 30 days supply | Qty: 30 | Fill #0

## 2017-07-09 NOTE — Patient Instructions (Addendum)
Please give patient forms for [employer] and [employer].  Give appointment with Ms. Boyd  Please call the BCCCP (breast and cervical cancer control program) at [phone] to schedule diagnostic mammogram   Try to limit salt in your food.  Td Vaccine (Tetanus and Diphtheria): What You Need to Know 1. Why get vaccinated? Tetanus  and diphtheria are very serious diseases. They are rare in the [place] today, but people who do become infected often have severe complications. Td vaccine is used to protect adolescents and adults from both of these diseases. Both tetanus and diphtheria are infections caused by bacteria. Diphtheria spreads from person to person through coughing or sneezing. Tetanus-causing bacteria enter the body through cuts, scratches, or wounds. TETANUS (lockjaw) causes painful muscle tightening and stiffness, usually all over the body.  It can lead to tightening of muscles in the head and neck so you can't open your mouth, swallow, or sometimes even breathe. Tetanus kills about 1 out of every 10 people who are infected even after receiving the best medical care.  DIPHTHERIA can cause a thick coating to form in the back of the throat.  It can lead to breathing problems, paralysis, heart failure, and death.  Before vaccines, as many as 200,000 cases of diphtheria and hundreds of cases of tetanus were reported in the [place] each year. Since vaccination began, reports of cases for both diseases have dropped by about 99%. 2. Td vaccine Td vaccine can protect adolescents and adults from tetanus and diphtheria. Td is usually given as a booster dose every 10 years but it can also be given earlier after a severe and dirty wound or burn. Another vaccine, called Tdap, which protects against pertussis in addition to tetanus and diphtheria, is sometimes recommended instead of Td vaccine. Your doctor or the person giving you the vaccine can give you more information. Td may  safely be given at the same time as other vaccines. 3. Some people should not get this vaccine  A person who has ever had a life-threatening allergic reaction after a previous dose of any tetanus or diphtheria containing vaccine, OR has a severe allergy to any part of this vaccine, should not get Td vaccine. Tell the person giving the vaccine about any severe allergies.  Talk to your doctor if you: ? had severe pain or swelling after any vaccine containing diphtheria or tetanus, ? ever had a condition called Guillain Barre Syndrome (GBS), ? aren't feeling well on the day the shot is scheduled. 4. What are the risks from Td vaccine? With any medicine, including vaccines, there is a chance of side effects. These are usually mild and go away on their own. Serious reactions are also possible but are rare. Most people who get Td vaccine do not have any problems with it. Mild problems following Td vaccine: (Did not interfere with activities)  Pain where the shot was given (about 8 people in 10)  Redness or swelling where the shot was given (about 1 person in 4)  Mild fever (rare)  Headache (about 1 person in 4)  Tiredness (about 1 person in 4)  Moderate problems following Td vaccine: (Interfered with activities, but did not require medical attention)  Fever over 102F (rare)  Severe problems following Td vaccine: (Unable to perform usual activities; required medical attention)  Swelling, severe pain, bleeding and/or redness in the arm where the shot was given (rare).  Problems that could happen after any vaccine:  People sometimes faint after  a medical procedure, including vaccination. Sitting or lying down for about 15 minutes can help prevent fainting, and injuries caused by a fall. Tell your doctor if you feel dizzy, or have vision changes or ringing in the ears.  Some people get severe pain in the shoulder and have difficulty moving the arm where a shot was given. This happens  very rarely.  Any medication can cause a severe allergic reaction. Such reactions from a vaccine are very rare, estimated at fewer than 1 in a million doses, and would happen within a few minutes to a few hours after the vaccination. As with any medicine, there is a very remote chance of a vaccine causing a serious injury or death. The safety of vaccines is always being monitored. For more information, visit: http://www.aguilar.org/ 5. What if there is a serious reaction? What should I look for? Look for anything that concerns you, such as signs of a severe allergic reaction, very high fever, or unusual behavior. Signs of a severe allergic reaction can include hives, swelling of the face and throat, difficulty breathing, a fast heartbeat, dizziness, and weakness. These would usually start a few minutes to a few hours after the vaccination. What should I do?  If you think it is a severe allergic reaction or other emergency that can't wait, call 9-1-1 or get the person to the nearest hospital. Otherwise, call your doctor.  Afterward, the reaction should be reported to the Vaccine Adverse Event Reporting System (VAERS). Your doctor might file this report, or you can do it yourself through the VAERS web site at www.vaers.SamedayNews.es, or by calling 973-649-4401. ? VAERS does not give medical advice. 6. The National Vaccine Injury Compensation Program The [employer] Vaccine Injury Compensation Program (VICP) is a federal program that was created to compensate people who may have been injured by certain vaccines. Persons who believe they may have been injured by a vaccine can learn about the program and about filing a claim by calling 913-725-0146 or visiting the [employer] website at GoldCloset.com.ee. There is a time limit to file a claim for compensation. 7. How can I learn more?  Ask your doctor. He or she can give you the vaccine package insert or suggest other sources of  information.  Call your local or state health department.  Contact the Centers for Disease Control and Prevention (CDC): ? Call 703-585-1729 (1-800-CDC-INFO) ? Visit CDC's website at http://hunter.com/ CDC Td Vaccine VIS (11/01/15) This information is not intended to replace advice given to you by your health care provider. Make sure you discuss any questions you have with your health care provider. Document Released: 05/06/2006 Document Revised: 03/29/2016 Document Reviewed: 03/29/2016 Elsevier Interactive Patient Education  2017 [employer].

## 2017-07-09 NOTE — Progress Notes (Signed)
Patient ID: DUSTY WAGONER, female    DOB: 12-17-1965  MRN: [id number]  CC: Hospitalization Follow-up (ED follow up) and Leg Pain   Subjective: Christina Cannon is a 51 y.o. female who presents for new pt visit. Last saw a PCP about 10 yrs ago. No insurance.  Her concerns today include:  Patient presents for hospital follow-up.  Hospitalized 11/19-22/2018 with acute diverticulitis.  Patient had failed outpatient management.  CT scan revealed sigmoid diverticulitis.  She was seen again in the ED 11/29 with lower abdominal pain.  CT scan showed improvement in the sigmoid diverticulitis without abscess or perforation.  She had low-grade temp with mild elevation in WBC. Flagyl changed to Augmentin due to nausea/vomiting -doing better. Has had to take Zofran and Norco a few times since then. -never had a colonoscopy  2. C/o pain and swelling in knees and ankles since 02/2016. LT>RT.  Started after her car broke down and she had to start walking to and from bus stop. About 1/2 mile from her house. -worse first thing in a.m and at end of day. + stiffness and sharp pains in knees that get better once she starts moving -also pain on sole of LT foot below 5th MT.  Worse with walking She wears shoes with thick soles  HM: due for colonoscopy, flu shot and Tdap  3. BP has fluctuated while in hosp. No prior hx of HTN  4. Smokes 1 pk every 2 wks.   Patient Active Problem List   Diagnosis Date Noted  . Diverticulitis 06/11/2017  . Acute diverticulitis 06/10/2017  . Abdominal pain 06/10/2017  . Nausea & vomiting 06/10/2017  . Leukocytosis 06/10/2017  . 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 Skin tag of vulva 08/10/2015  . Encounter for annual physical exam 08/10/2015  . UTI (lower urinary tract infection) 08/10/2015     Current Outpatient Medications on File Prior to Visit  Medication Sig Dispense Refill  . aspirin 325 MG EC tablet Take 325 mg every 6 (six) hours as needed by mouth  for pain.     Marland Kitchen HYDROcodone-acetaminophen (NORCO) 5-325 MG tablet Take 1-2 tablets by mouth every 6 (six) hours as needed. 10 tablet 0  . acetaminophen (TYLENOL) 500 MG tablet Take 500 mg every 6 (six) hours as needed by mouth (for pain).    Marland Kitchen amoxicillin-clavulanate (AUGMENTIN) 875-125 MG tablet Take 1 tablet by mouth every 12 (twelve) hours. For 6days (Patient not taking: Reported on 07/09/2017) 12 tablet 0  . ondansetron (ZOFRAN ODT) 8 MG disintegrating tablet 8mg  ODT q4 hours prn nausea (Patient not taking: Reported on 07/09/2017) 6 tablet 0   No current facility-administered medications on file prior to visit.     Allergies  Allergen Reactions  . Ceftin [Cefuroxime Axetil] Anaphylaxis and Swelling    Throat and face swell. Pt stated she had a regimen in the hospital recently(2018) and did just fine.    Social History   Socioeconomic History  . Marital status: Widowed    Spouse name: Not on file  . Number of children: Not on file  . Years of education: Not on file  . Highest education level: Not on file  Social Needs  . Financial resource strain: Not on file  . Food insecurity - worry: Not on file  . Food insecurity - inability: Not on file  . Transportation needs - medical: Not on file  . Transportation needs - non-medical: Not on file  Occupational History  . Not on  file  Tobacco Use  . Smoking status: Former Smoker    Packs/day: 0.50    Types: Cigarettes  . Smokeless tobacco: Never Used  Substance and Sexual Activity  . Alcohol use: No  . Drug use: No    Comment: Prior use  . Sexual activity: Yes    Birth control/protection: None  Other Topics Concern  . Not on file  Social History Narrative  . Not on file    Family History  Problem Relation Age of Onset  . Diabetes Mother   . Hypertension Mother   . Diabetes Father   . Hypertension Father   . Heart failure Father     Past Surgical History:  Procedure Laterality Date  . CHOLECYSTECTOMY    . HAND  SURGERY      ROS: Review of Systems  Constitutional: Negative for activity change, appetite change and fever.  Respiratory: Negative for chest tightness and shortness of breath.   Cardiovascular: Negative for chest pain and palpitations.  Gastrointestinal: Negative for diarrhea and vomiting.  Psychiatric/Behavioral: Negative for dysphoric mood. The patient is not nervous/anxious.     PHYSICAL EXAM: BP 138/82   Pulse 68   Temp 98.3 F (36.8 C) (Oral)   Resp 16   Wt 223 lb 9.6 oz (101.4 kg)   SpO2 96%   BMI 35.02 kg/m   LTT 132/90, RT 138/90 Physical Exam  General appearance - alert, well appearing, middle-aged African-American female and in no distress Mental status - alert, oriented to person, place, and time, normal mood, behavior, speech, dress, motor activity, and thought processes Eyes - pupils equal and reactive, extraocular eye movements intact Mouth - mucous membranes moist, pharynx normal without lesions Neck - supple, no significant adenopathy Chest - clear to auscultation, no wheezes, rales or rhonchi, symmetric air entry Heart - normal rate, regular rhythm, normal S1, S2, no murmurs, rubs, clicks or gallops Abdomen - soft, nontender, nondistended, no masses or organomegaly Musculoskeletal -knees: Left knee-mild joint enlargement.  Moderate crepitus and popping and mild discomfort on passive range of motion.  Right knee- mild joint enlargement.  Good range of motion without crepitus. Ankles: Mild joint enlargement of both ankles.  Good range of motion. Skin: 3 cm callous below LT 5th MT. Tender to touch Extremities -no LE edema    Chemistry      Component Value Date/Time   NA 136 06/19/2017 2216   K 3.8 06/19/2017 2216   CL 107 06/19/2017 2216   CO2 20 (L) 06/19/2017 2216   BUN 10 06/19/2017 2216   CREATININE 0.92 06/19/2017 2216      Component Value Date/Time   CALCIUM 9.2 06/19/2017 2216   ALKPHOS 58 06/19/2017 2216   AST 22 06/19/2017 2216   ALT 15  06/19/2017 2216   BILITOT 1.2 06/19/2017 2216     Lab Results  Component Value Date   WBC 13.5 (H) 06/19/2017   HGB 13.7 06/19/2017   HCT 39.2 06/19/2017   MCV 80.5 06/19/2017   PLT 278 06/19/2017    Depression screen PHQ 2/9 07/09/2017  Decreased Interest 0  Down, Depressed, Hopeless 0  PHQ - 2 Score 0     ASSESSMENT AND PLAN: 1. History of colonic diverticulitis Patient to sign up for orange card/cone discount.  Once approved will refer to GI for colonoscopy  2. Primary osteoarthritis of both knees 3. Primary osteoarthritis of both ankles Encourage her to continue walking.  Weight loss encouraged. - DG Knee Complete 4 Views Left; Future -  DG Knee Complete 4 Views Right; Future - celecoxib (CELEBREX) 200 MG capsule; Take 1 capsule (200 mg total) by mouth daily.  Dispense: 30 capsule; Refill: 4  4. Tobacco dependence Patient advised to quit smoking. Discussed health risks associated with smoking including lung and other types of cancers, chronic lung diseases and CV risks.. Pt ready to give trail of quitting.  She will try to quit on her own  5. Breast cancer screening - MM Digital Screening; Future  6. Need for Tdap vaccination -pt deferred having this done until she has been approved for the blue card  - 7. Callous ulcer, limited to breakdown of skin ([place]) Will refer to podiatry when she has the orange card.  8. Elevated blood pressure reading Advised to limit salt in foods.  Continue walking  Patient was given the opportunity to ask questions.  Patient verbalized understanding of the plan and was able to repeat key elements of the plan.   No orders of the defined types were placed in this encounter.    Requested Prescriptions    No prescriptions requested or ordered in this encounter    F/u in 7 wks Karle Plumber, MD, Rosalita Chessman

## 2017-08-06 ENCOUNTER — Other Ambulatory Visit: Payer: Self-pay | Admitting: Obstetrics and Gynecology

## 2017-08-06 DIAGNOSIS — Z1231 Encounter for screening mammogram for malignant neoplasm of breast: Secondary | ICD-10-CM

## 2017-08-09 ENCOUNTER — Other Ambulatory Visit: Payer: Self-pay | Admitting: Pharmacist

## 2017-08-09 NOTE — Progress Notes (Unsigned)
boost

## 2017-08-12 ENCOUNTER — Other Ambulatory Visit: Payer: Self-pay | Admitting: Pharmacist

## 2017-08-12 MED ORDER — TETANUS-DIPHTH-ACELL PERTUSSIS 5-2.5-18.5 LF-MCG/0.5 IM SUSP: 0.5000 mL | Freq: Once | INTRAMUSCULAR | 0 refills | Status: AC

## 2017-09-05 ENCOUNTER — Ambulatory Visit
Admission: RE | Admit: 2017-09-05 | Discharge: 2017-09-05 | Disposition: A | Discharge status: Home / Self Care | Payer: No Typology Code available for payment source | Source: Ambulatory Visit | Attending: Obstetrics and Gynecology | Admitting: Obstetrics and Gynecology

## 2017-09-05 ENCOUNTER — Encounter (HOSPITAL_COMMUNITY): Payer: Self-pay

## 2017-09-05 ENCOUNTER — Ambulatory Visit (HOSPITAL_COMMUNITY)
Admission: RE | Admit: 2017-09-05 | Discharge: 2017-09-05 | Disposition: A | Discharge status: Home / Self Care | Payer: Self-pay | Source: Ambulatory Visit | Attending: Obstetrics and Gynecology | Admitting: Obstetrics and Gynecology

## 2017-09-05 VITALS — BP 118/76 | Ht 67.0 in | Wt 222.2 lb

## 2017-09-05 DIAGNOSIS — Z1239 Encounter for other screening for malignant neoplasm of breast: Secondary | ICD-10-CM

## 2017-09-05 DIAGNOSIS — Z1231 Encounter for screening mammogram for malignant neoplasm of breast: Secondary | ICD-10-CM

## 2017-09-05 NOTE — Progress Notes (Signed)
Complaints of breast tenderness around menstrual cycle.  Pap Smear: Pap smear not completed today. Last Pap smear was 08/10/2015 at [facility] and normal with negative HPV. Per patient has no history of an abnormal Pap smear. Last Pap smear result is in Epic.  Physical exam: Breasts Breasts symmetrical. No skin abnormalities bilateral breasts. No nipple retraction bilateral breasts. No nipple discharge bilateral breasts. No lymphadenopathy. No lumps palpated bilateral breasts. Complaints of bilateral outer breast tenderness. Patient stated her menstrual cycle is due in a few days. Referred patient to the [facility] for a screening mammogram. Appointment scheduled for Thursday, September 05, 2017 at 1510.        Pelvic/Bimanual No Pap smear completed today since last Pap smear and HPV typing was 08/10/2015. Pap smear not indicated per BCCCP guidelines.   Smoking History: Patient is a former smoker that quit in August 2018.  Patient Navigation: Patient education provided. Access to services provided for patient through [facility] program.   Colorectal Cancer Screening: Per patient has never had a colonoscopy completed. No complaints today. FIT Test given to patient to complete and return to BCCCP.

## 2017-09-05 NOTE — Patient Instructions (Signed)
Explained breast self awareness with Christina Cannon. Patient did not need a Pap smear today due to last Pap smear and HPV typing was 08/10/2015. Let her know BCCCP will cover Pap smears and HPV typing every 5 years unless has a history of abnormal Pap smears. Referred patient to the [facility] for a screening mammogram. Appointment scheduled for Thursday, September 05, 2017 at 1510. Let [facility] know the [facility] will follow up with her within the next couple weeks with results of mammogram by letter or phone. Christina Cannon verbalized understanding.  Avira Tillison, Arvil Chaco, RN 2:31 PM

## 2017-09-06 ENCOUNTER — Encounter (HOSPITAL_COMMUNITY): Payer: Self-pay | Admitting: *Deleted

## 2017-09-09 ENCOUNTER — Other Ambulatory Visit: Payer: Self-pay | Admitting: Obstetrics and Gynecology

## 2017-09-09 DIAGNOSIS — R928 Other abnormal and inconclusive findings on diagnostic imaging of breast: Secondary | ICD-10-CM

## 2017-09-17 ENCOUNTER — Ambulatory Visit
Admission: RE | Admit: 2017-09-17 | Discharge: 2017-09-17 | Disposition: A | Discharge status: Home / Self Care | Payer: No Typology Code available for payment source | Source: Ambulatory Visit | Attending: Obstetrics and Gynecology | Admitting: Obstetrics and Gynecology

## 2017-09-17 DIAGNOSIS — R928 Other abnormal and inconclusive findings on diagnostic imaging of breast: Secondary | ICD-10-CM

## 2017-10-08 ENCOUNTER — Ambulatory Visit: Payer: Self-pay | Attending: Internal Medicine | Admitting: Internal Medicine

## 2017-10-08 ENCOUNTER — Ambulatory Visit (HOSPITAL_COMMUNITY)
Admission: RE | Admit: 2017-10-08 | Discharge: 2017-10-08 | Disposition: A | Discharge status: Home / Self Care | Payer: No Typology Code available for payment source | Source: Ambulatory Visit | Attending: Internal Medicine | Admitting: Internal Medicine

## 2017-10-08 ENCOUNTER — Encounter: Payer: Self-pay | Admitting: Internal Medicine

## 2017-10-08 VITALS — BP 126/86 | HR 80 | Temp 99.0°F | Resp 16 | Ht 67.0 in | Wt 226.6 lb

## 2017-10-08 DIAGNOSIS — K5900 Constipation, unspecified: Secondary | ICD-10-CM | POA: Insufficient documentation

## 2017-10-08 DIAGNOSIS — Z881 Allergy status to other antibiotic agents status: Secondary | ICD-10-CM | POA: Insufficient documentation

## 2017-10-08 DIAGNOSIS — E6609 Other obesity due to excess calories: Secondary | ICD-10-CM

## 2017-10-08 DIAGNOSIS — M170 Bilateral primary osteoarthritis of knee: Secondary | ICD-10-CM | POA: Insufficient documentation

## 2017-10-08 DIAGNOSIS — Z6835 Body mass index (BMI) 35.0-35.9, adult: Secondary | ICD-10-CM | POA: Insufficient documentation

## 2017-10-08 DIAGNOSIS — Z1211 Encounter for screening for malignant neoplasm of colon: Secondary | ICD-10-CM

## 2017-10-08 DIAGNOSIS — E669 Obesity, unspecified: Secondary | ICD-10-CM | POA: Insufficient documentation

## 2017-10-08 DIAGNOSIS — R030 Elevated blood-pressure reading, without diagnosis of hypertension: Secondary | ICD-10-CM | POA: Insufficient documentation

## 2017-10-08 DIAGNOSIS — Z87891 Personal history of nicotine dependence: Secondary | ICD-10-CM | POA: Insufficient documentation

## 2017-10-08 MED ORDER — DICLOFENAC SODIUM 1 % TD GEL: 4.0000 g | Freq: Four times a day (QID) | TRANSDERMAL | 2 refills | Status: DC

## 2017-10-08 MED FILL — DICLOFENAC SODIUM 1% GEL: 1 | 6 days supply | Qty: 100 | Fill #0

## 2017-10-08 MED FILL — $BOOSTRIX VACCINE SYRINGE: 5-2.5-18.5 | 1 days supply | Qty: 1 | Fill #0

## 2017-10-08 NOTE — Patient Instructions (Signed)
You can go to [facility] to have x-rays done as ordered on last visit.    Use Voltaren Gel as needed on knees.  You can take the Celebrex in the evening.    Work on getting your weight down.    Try eating more fiber rich foods or using Fiber-con or Citrucel over the counter as need for constipation.

## 2017-10-08 NOTE — Progress Notes (Signed)
Patient ID: Christina Cannon, female    DOB: Oct 28, 1965  MRN: [id number]  CC: Follow-up (3 month)   Subjective: Christina Cannon is a 52 y.o. female who presents for chronic ds management.  Last seen 06/2017. Her concerns today include:  Pt with hx of tob dep, diverticulosis, obese, OA knees/ankles, elev BP  1.  Thinks she had a diverticulitis episode last wk.  Was constipated for about 3 days.  Started having abdominal pain.  Drank some prune juice and took some Flagyl that she had left. Did notice some blood in stools x 2.  Never had a c-scope  2.  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  Does a lot of standing and walking.  "They stay swollen and painful all the time."  Worse during work hrs.  Celebrex makes her sleepy; can only take at nights.  "Even Tylenol makes me sleepy."  Started using a pain rub that her sister order on-line.  Not helpful. -x-rays ordered on last visit.  She did not have them done as yet.   3.  Had MMG last mth.  Was called for additional imaging due to changes seen in Lt breast.  F/u studies were okay.  She does not feel any abnormalities in either breast.  Patient Active Problem List   Diagnosis Date Noted  . History of colonic diverticulitis 07/09/2017  . Primary osteoarthritis of both knees 07/09/2017  . Primary osteoarthritis of both ankles 07/09/2017  . Tobacco dependence 07/09/2017  . Callous ulcer, limited to breakdown of skin ([place]) 07/09/2017  . Elevated blood pressure reading 07/09/2017  . Skin tag of vulva 08/10/2015     Current Outpatient Medications on File Prior to Visit  Medication Sig Dispense Refill  . celecoxib (CELEBREX) 200 MG capsule Take 1 capsule (200 mg total) by mouth daily. 30 capsule 4  . acetaminophen (TYLENOL) 500 MG tablet Take 500 mg every 6 (six) hours as needed by mouth (for pain).    Marland Kitchen aspirin 325 MG EC tablet Take 325 mg every 6 (six) hours as needed by mouth for pain.     Marland Kitchen  ondansetron (ZOFRAN ODT) 8 MG disintegrating tablet 8mg  ODT q4 hours prn nausea (Patient not taking: Reported on 07/09/2017) 6 tablet 0   No current facility-administered medications on file prior to visit.     Allergies  Allergen Reactions  . Ceftin [Cefuroxime Axetil] Anaphylaxis and Swelling    Throat and face swell. Pt stated she had a regimen in the hospital recently(2018) and did just fine.    Social History   Socioeconomic History  . Marital status: Widowed    Spouse name: Not on file  . Number of children: Not on file  . Years of education: Not on file  . Highest education level: Not on file  Social Needs  . Financial resource strain: Not on file  . Food insecurity - worry: Not on file  . Food insecurity - inability: Not on file  . Transportation needs - medical: Not on file  . Transportation needs - non-medical: Not on file  Occupational History  . Not on file  Tobacco Use  . Smoking status: Former Smoker    Packs/day: 0.50    Types: Cigarettes  . Smokeless tobacco: Never Used  Substance and Sexual Activity  . Alcohol use: No  . Drug use: No    Comment: Prior use  . Sexual activity: Yes    Birth  control/protection: None  Other Topics Concern  . Not on file  Social History Narrative  . Not on file    Family History  Problem Relation Age of Onset  . Diabetes Mother   . Hypertension Mother   . Diabetes Father   . Hypertension Father   . Heart failure Father     Past Surgical History:  Procedure Laterality Date  . CHOLECYSTECTOMY    . HAND SURGERY      ROS: Review of Systems Neg except as above  PHYSICAL EXAM: BP 126/86   Pulse 80   Temp 99 F (37.2 C) (Oral)   Resp 16   Ht 5\' 7"  (1.702 m)   Wt 226 lb 9.6 oz (102.8 kg)   SpO2 100%   BMI 35.49 kg/m   Wt Readings from Last 3 Encounters:  10/08/17 226 lb 9.6 oz (102.8 kg)  09/05/17 222 lb 3.2 oz (100.8 kg)  07/09/17 223 lb 9.6 oz (101.4 kg)   Repeat BP 130/86 Physical Exam General  appearance - alert, well appearing, and in no distress Mental status - alert, oriented to person, place, and time, normal mood, behavior, speech, dress, motor activity, and thought processes Neck - supple, no significant adenopathy Chest - clear to auscultation, no wheezes, rales or rhonchi, symmetric air entry Heart - normal rate, regular rhythm, normal S1, S2, no murmurs, rubs, clicks or gallops Musculoskeletal -knees:  Mild edema.  No point tenderness.  Moderate discomfort and crepitus with passive ROM Extremities - no LE edema  ASSESSMENT AND PLAN: 1. Primary osteoarthritis of both knees -Weight loss encouraged through healthy eating habits and exercise as tolerated.  Since she finds Celebrex helpful but can only take it at night, will give Voltaren gel to use during the day - diclofenac sodium (VOLTAREN) 1 % GEL; Apply 4 g topically 4 (four) times daily.  Dispense: 100 g; Refill: 2  2. Constipation, unspecified constipation type 3. Colon cancer screening -Encourage patient to increase fiber in the diet to include eating green leafy vegetables at least once a day.  She can also purchase the FiberCon or Citrucel over-the-counter and use as needed.  Drink several glasses of water a day. -Recommend referral for colonoscopy.  Patient thinks she will get healthcare coverage fairly soon with her new job but will also apply for the orange card in the meantime just in case.  I have asked that she call and let me know when she is gotten either so that I can submit the referral for the colonoscopy.  4. Elevated blood pressure reading Discussed DASH diet.  She admits that she does a lot of added salt to the foods and will make an effort to cut back on this  5. Class 2 obesity due to excess calories without serious comorbidity with body mass index (BMI) of 35.0 to 35.9 in adult See #1 above Patient was given the opportunity to ask questions.  Patient verbalized understanding of the plan and was able  to repeat key elements of the plan.   No orders of the defined types were placed in this encounter.    Requested Prescriptions   Signed Prescriptions Disp Refills  . diclofenac sodium (VOLTAREN) 1 % GEL 100 g 2    Sig: Apply 4 g topically 4 (four) times daily.    Return in about 3 months (around 01/08/2018).  Karle Plumber, MD, FACP

## 2017-10-11 ENCOUNTER — Telehealth: Payer: Self-pay

## 2017-10-11 NOTE — Telephone Encounter (Signed)
Contacted pt to go over xray results pt didn't answer left vm asking pt to give me a call at her earliest convenience

## 2017-10-14 ENCOUNTER — Telehealth: Payer: Self-pay | Admitting: Internal Medicine

## 2017-10-14 NOTE — Telephone Encounter (Signed)
Patient called you back for results. Please fu with patient.

## 2017-10-14 NOTE — Telephone Encounter (Signed)
Returned pt call and went over xray results pt is aware and doesn't have any questions or concerns

## 2017-10-18 ENCOUNTER — Ambulatory Visit: Payer: No Typology Code available for payment source

## 2017-11-12 ENCOUNTER — Other Ambulatory Visit: Payer: Self-pay

## 2017-11-12 ENCOUNTER — Encounter (HOSPITAL_COMMUNITY): Payer: Self-pay

## 2017-11-12 ENCOUNTER — Emergency Department (HOSPITAL_COMMUNITY): Payer: Self-pay

## 2017-11-12 ENCOUNTER — Emergency Department (HOSPITAL_COMMUNITY)
Admission: EM | Admit: 2017-11-12 | Discharge: 2017-11-12 | Disposition: A | Discharge status: Home / Self Care | Payer: Self-pay | Attending: Emergency Medicine | Admitting: Emergency Medicine

## 2017-11-12 DIAGNOSIS — Z7982 Long term (current) use of aspirin: Secondary | ICD-10-CM | POA: Insufficient documentation

## 2017-11-12 DIAGNOSIS — Z79899 Other long term (current) drug therapy: Secondary | ICD-10-CM | POA: Insufficient documentation

## 2017-11-12 DIAGNOSIS — Z87891 Personal history of nicotine dependence: Secondary | ICD-10-CM | POA: Insufficient documentation

## 2017-11-12 DIAGNOSIS — M19072 Primary osteoarthritis, left ankle and foot: Secondary | ICD-10-CM | POA: Insufficient documentation

## 2017-11-12 NOTE — ED Provider Notes (Signed)
[facility] EMERGENCY DEPARTMENT Provider Note   CSN: [id number] Arrival date & time: 11/12/17  1016     History   Chief Complaint Chief Complaint  Patient presents with  . Foot Pain    HPI Christina Cannon is a 52 y.o. female with PMHx osteoarthritis, presenting to the ED with complaint of left foot pain x53month. Pt states she has been treating her bilateral knee pain due to arthritis, and began having foot pain as well. Pain is location on the dorsum of the foot, worse with ambulation. Has been wrapping it and wearing multiple pairs of socks. Denies injury, or other complaints.   The history is provided by the patient.    Past Medical History:  Diagnosis Date  . Diverticulitis     Patient Active Problem List   Diagnosis Date Noted  . History of colonic diverticulitis 07/09/2017  . Primary osteoarthritis of both knees 07/09/2017  . Primary osteoarthritis of both ankles 07/09/2017  . Tobacco dependence 07/09/2017  . Callous ulcer, limited to breakdown of skin ([place]) 07/09/2017  . Elevated blood pressure reading 07/09/2017  . Skin tag of vulva 08/10/2015    Past Surgical History:  Procedure Laterality Date  . CHOLECYSTECTOMY    . HAND SURGERY       OB History   None      Home Medications    Prior to Admission medications   Medication Sig Start Date End Date Taking? Authorizing Provider  acetaminophen (TYLENOL) 500 MG tablet Take 500 mg every 6 (six) hours as needed by mouth (for pain).    [provider]  aspirin 325 MG EC tablet Take 325 mg every 6 (six) hours as needed by mouth for pain.     [provider]  celecoxib (CELEBREX) 200 MG capsule Take 1 capsule (200 mg total) by mouth daily. 07/09/17   Ladell Pier, MD  diclofenac sodium (VOLTAREN) 1 % GEL Apply 4 g topically 4 (four) times daily. 10/08/17   Ladell Pier, MD  ondansetron (ZOFRAN ODT) 8 MG disintegrating tablet 8mg  ODT q4 hours prn  nausea Patient not taking: Reported on 07/09/2017 06/20/17   Veryl Speak, MD    Family History Family History  Problem Relation Age of Onset  . Diabetes Mother   . Hypertension Mother   . Diabetes Father   . Hypertension Father   . Heart failure Father     Social History Social History   Tobacco Use  . Smoking status: Former Smoker    Packs/day: 0.50    Types: Cigarettes  . Smokeless tobacco: Never Used  Substance Use Topics  . Alcohol use: No  . Drug use: No    Types: Marijuana    Comment: Prior use     Allergies   Ceftin [cefuroxime axetil]   Review of Systems Review of Systems  Musculoskeletal: Positive for arthralgias.  Skin: Negative for color change.     Physical Exam Updated Vital Signs BP 126/86 (BP Location: Right Arm)   Pulse 64   Temp 99.3 F (37.4 C) (Oral)   Resp 16   Ht 5\' 7"  (1.702 m)   Wt 89.8 kg (198 lb)   SpO2 100%   BMI 31.01 kg/m   Physical Exam  Constitutional: She appears well-developed and well-nourished. No distress.  HENT:  Head: Normocephalic and atraumatic.  Eyes: Conjunctivae are normal.  Cardiovascular: Normal rate and intact distal pulses.  Pulmonary/Chest: Effort normal.  Musculoskeletal:  Dorsum of right foot with  tenderness, mostly medial aspect. Some tenderness anterior ankle. Some swelling assoc, though without erythema or warmth. Pain with ROM. Gait with limp favoring left.  Psychiatric: She has a normal mood and affect. Her behavior is normal.  Nursing note and vitals reviewed.    ED Treatments / Results  Labs (all labs ordered are listed, but only abnormal results are displayed) Labs Reviewed - No data to display  EKG None  Radiology Dg Ankle Complete Left  Result Date: 11/12/2017 CLINICAL DATA:  Two weeks of pain and swelling in the left ankle and left foot with no known injury. Symptoms are worse today. EXAM: LEFT ANKLE COMPLETE - 3+ VIEW COMPARISON:  None in PACs FINDINGS: The bones are  subjectively adequately mineralized. The joint mortise is preserved. There are spurs arising from the tips of the medial and lateral malleoli. No fracture is observed. The talar dome and remainder of the talus are grossly normal as is the calcaneus. There is a large plantar and smaller Achilles region calcaneal spur. There are spurs arising from the intertarsal joints especially dorsally. There is mild soft tissue swelling anteriorly and medially. IMPRESSION: There is moderate osteoarthritic change involving the tibiotalar joints and the intertarsal joints. There is plantar and Achilles region calcaneal spurring. No acute fracture nor dislocation is observed. Electronically Signed   By: David  Martinique M.D.   On: 11/12/2017 12:19   Dg Foot Complete Left  Result Date: 11/12/2017 CLINICAL DATA:  Two weeks of left ankle and foot pain and swelling with symptoms worse today. No known injury. The pain is centered in the great toe region and extends proximally. EXAM: LEFT FOOT - COMPLETE 3+ VIEW COMPARISON:  None in PACs FINDINGS: The bones of the foot are subjectively adequately mineralized. There is no acute or healing fracture. Specific attention to the great toe reveals the joint spaces to be well maintained. There is no lytic or blastic or erosive change. The interphalangeal and metatarsophalangeal joint spaces are well-maintained. The tarsometatarsal joints exhibit mild degenerative change. There are degenerative changes with prominent spur formation involving the intertarsal joints. There are plantar and Achilles region calcaneal spurs. There is mild soft tissue swelling over the dorsum of the midfoot. IMPRESSION: No acute bony abnormality of the great toe. Moderate osteoarthritic spurring of the tarsometatarsal and the intertarsal joints. Electronically Signed   By: David  Martinique M.D.   On: 11/12/2017 12:18    Procedures Procedures (including critical care time)  Medications Ordered in ED Medications - No  data to display   Initial Impression / Assessment and Plan / ED Course  I have reviewed the triage vital signs and the nursing notes.  Pertinent labs & imaging results that were available during my care of the patient were reviewed by me and considered in my medical decision making (see chart for details).     Pt presenting with foot pain x 1 month, no injuries. On exam foot is tender with pain w ROM, but no current medical concern for septic arthritis or joint as pt is afebrile & without warmth or erythema of the affected area.  Patient X-Ray negative for obvious fracture or dislocation, though showing OA in multiple joints in the foot. Given patient's limp and difficulty ambulating, cam walker boot given. Pt advised to follow up with orthopedics if symptoms persist for further evaluation if conservative therapies do not work. Recommended PT, exercise, and tylenol. Return precaurtions discussed. Patient will be dc home & is agreeable with above plan.  Discussed results,  findings, treatment and follow up. Patient advised of return precautions. Patient verbalized understanding and agreed with plan.  Final Clinical Impressions(s) / ED Diagnoses   Final diagnoses:  Primary osteoarthritis of left foot    ED Discharge Orders    None       Robinson, Martin[place] N, PA-C 11/12/17 1345    Mabe, Forbes Cellar, MD 11/12/17 1354

## 2017-11-12 NOTE — Discharge Instructions (Signed)
Apply ice to your foot for 20 minutes at a time and elevate it when possible.  Continue taking anti-inflammatories for pain.  Schedule appointment with your primary care provider if symptoms persist.

## 2017-11-12 NOTE — ED Notes (Signed)
Pt returned from radiology at this time.  

## 2017-11-12 NOTE — ED Triage Notes (Signed)
Pt c/o swelling in left foot X1 month, states she has been wrapping it and wearing extra socks as cushion. Pt ambulating with limp, declined wheelchair when offered by staff.

## 2018-01-09 ENCOUNTER — Ambulatory Visit: Payer: No Typology Code available for payment source | Admitting: Internal Medicine

## 2019-05-25 IMAGING — CT CT ABD-PELV W/ CM
2 of 5 series · 16 of 46 positions shown, 18 images · IV contrast (Omni 300)
Comparison: None.

CLINICAL DATA: Abdominal pain

EXAM:
CT ABDOMEN AND PELVIS WITH CONTRAST
TECHNIQUE: Multidetector CT imaging of the abdomen and pelvis was performed
using the standard protocol following bolus administration of
intravenous contrast.
CONTRAST:  100mL Y28SG3-8TT IOPAMIDOL ([id number]) INJECTION 61%

[Series 3: a/p w/ 5mm · axial · 0.95mm/px · z∈[+830,+1230]mm · 13 of 94 slices shown, 15 images]
[im 7/94  soft-tissue]
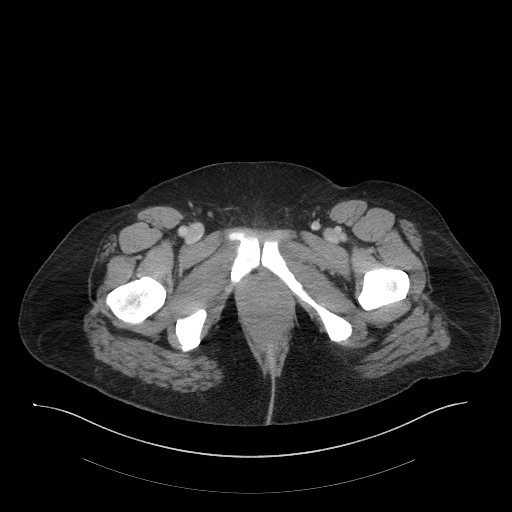
[im 7/94  bone]
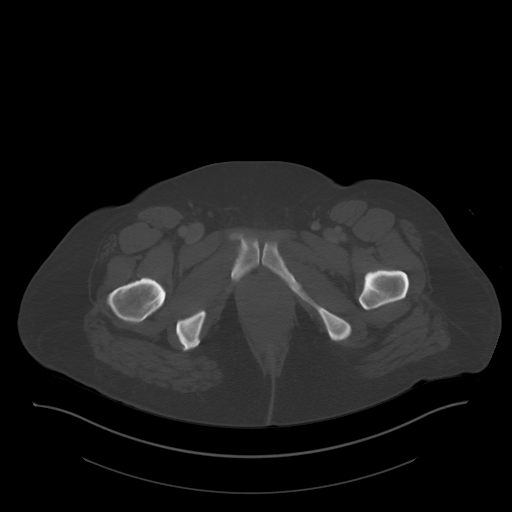
[im 13/94  soft-tissue]
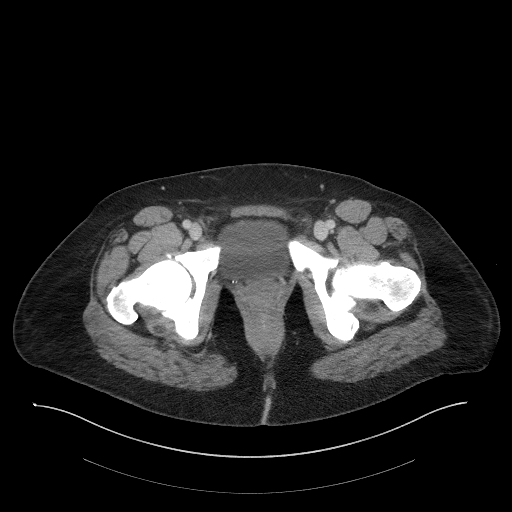
[im 19/94  soft-tissue]
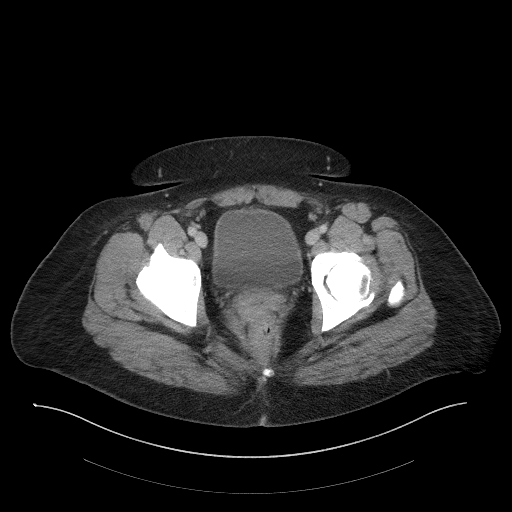
[im 25/94  soft-tissue]
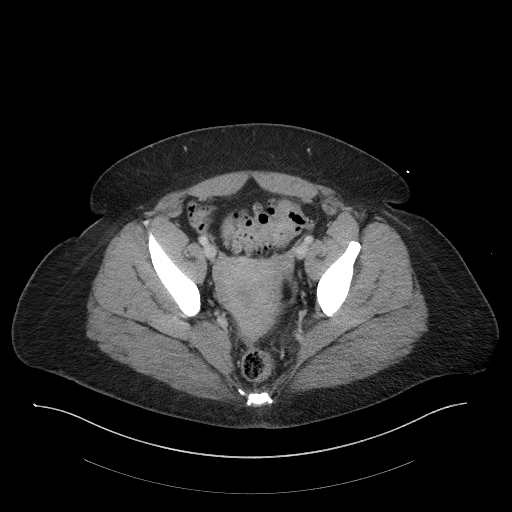
[im 32/94  soft-tissue]
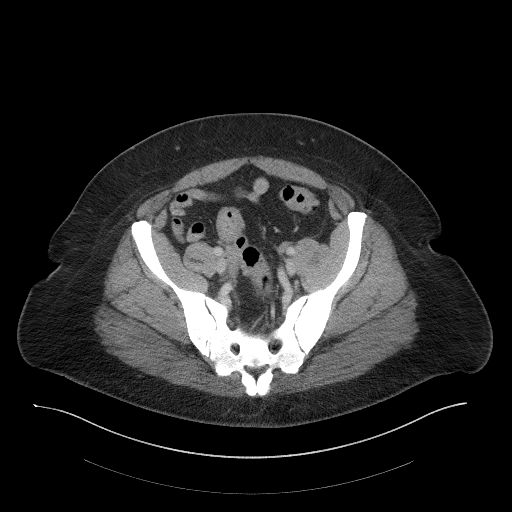
[im 38/94  soft-tissue]
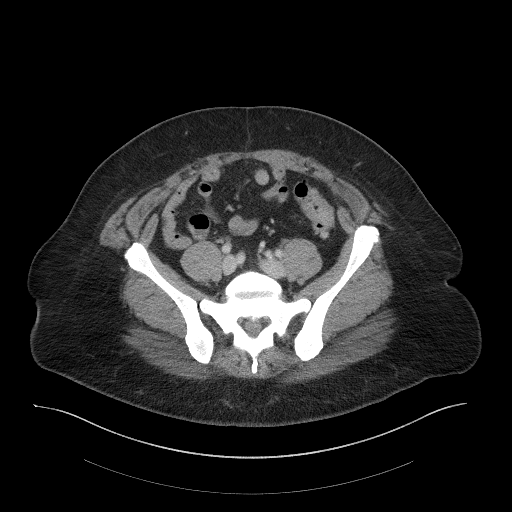
[im 50/94  soft-tissue]
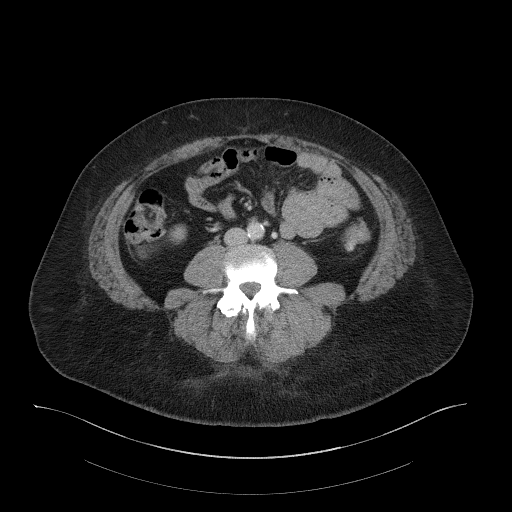
[im 56/94  soft-tissue]
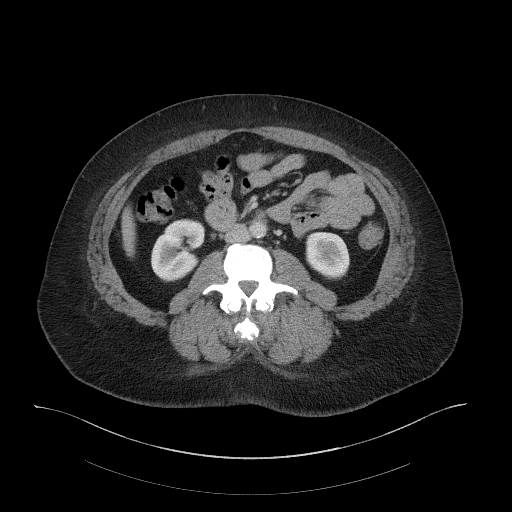
[im 63/94  soft-tissue]
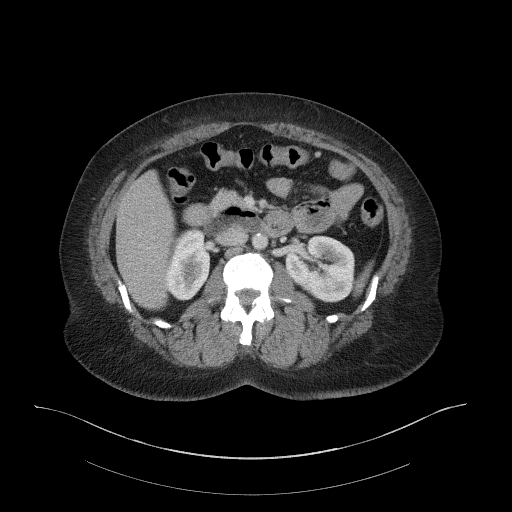
[im 63/94  bone]
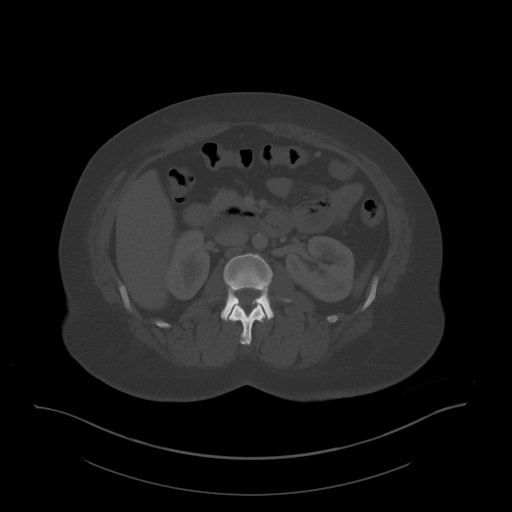
[im 69/94  soft-tissue]
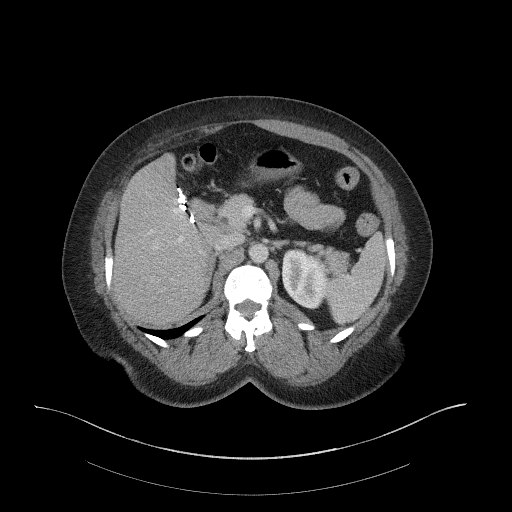
[im 75/94  soft-tissue]
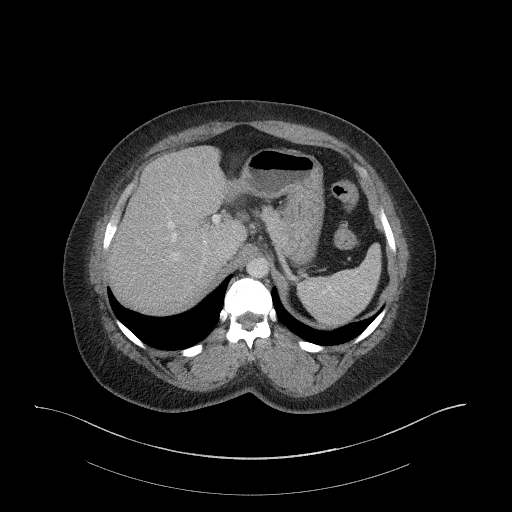
[im 81/94  soft-tissue]
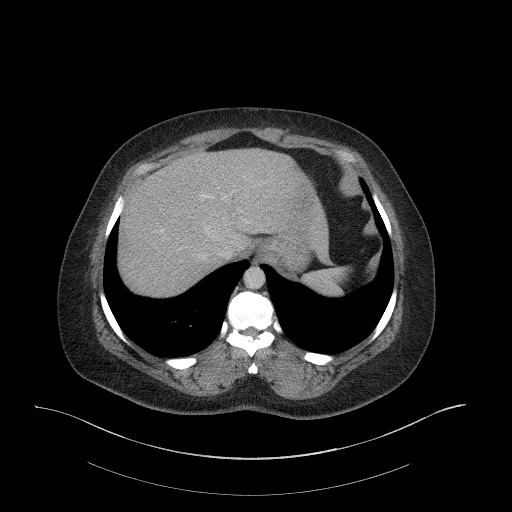
[im 87/94  soft-tissue]
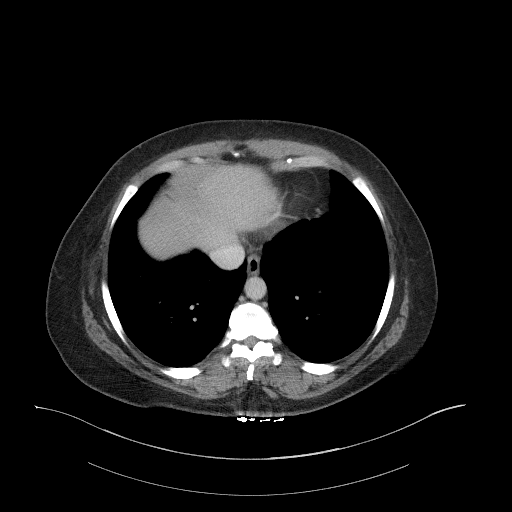

[Series 6: a/p w/ cor · coronal · 0.81mm/px · 3 of 151 slices shown]
[im 51/151  soft-tissue]
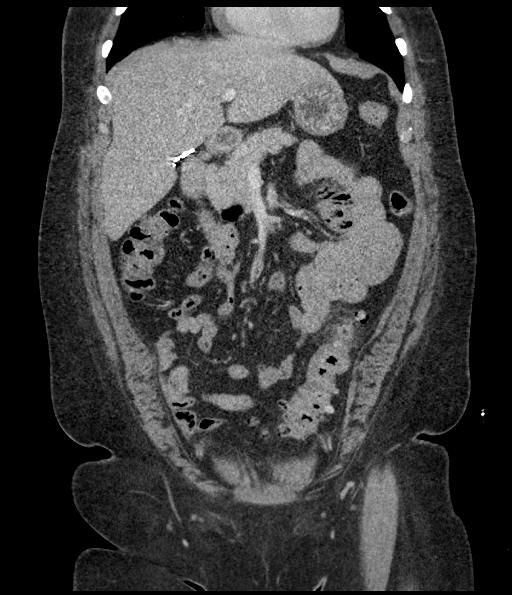
[im 67/151  soft-tissue]
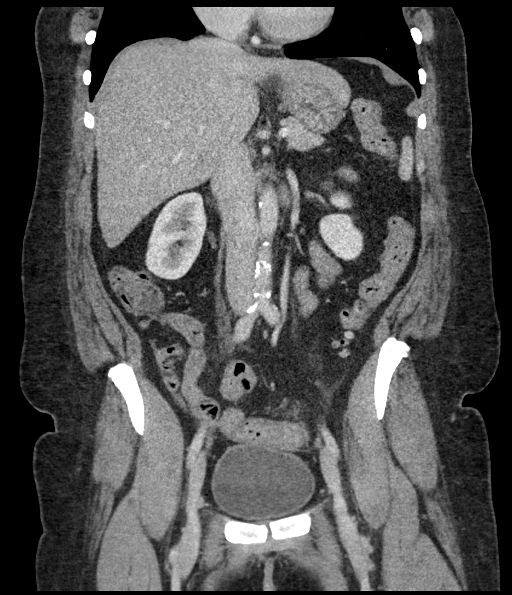
[im 84/151  soft-tissue]
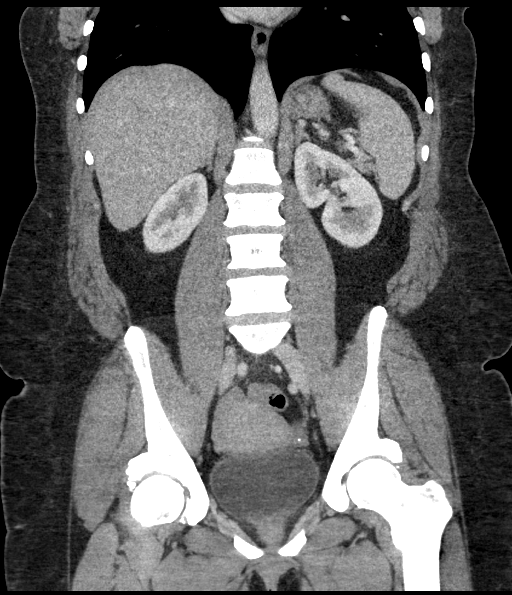

[16 of 46 positions shown; findings below may reference images not displayed]

FINDINGS: Lower chest: No pulmonary nodules or pleural effusion. No visible
pericardial effusion.

Hepatobiliary: Normal hepatic contours and density. No visible
biliary dilatation. Status post cholecystectomy.

Pancreas: Normal contours without ductal dilatation. No
peripancreatic fluid collection.

Spleen: Normal.

Adrenals/Urinary Tract:

--Adrenal glands: Normal.

--Right kidney/ureter: No hydronephrosis or perinephric stranding.
No nephrolithiasis. No obstructing ureteral stones.

--Left kidney/ureter: No hydronephrosis or perinephric stranding. No
nephrolithiasis. No obstructing ureteral stones.

--Urinary bladder: Unremarkable.

Stomach/Bowel:

--Stomach/Duodenum: No hiatal hernia or other gastric abnormality.
Normal duodenal course and caliber.

--Small bowel: No dilatation or inflammation.

--Colon: There is rectosigmoid diverticulosis with mild inflammatory
change of the surrounding fat of the proximal sigmoid colon.

--Appendix: Normal.

Vascular/Lymphatic: Atherosclerotic calcification is present within
the non-aneurysmal abdominal aorta, without hemodynamically
significant stenosis. No abdominal or pelvic lymphadenopathy.

Reproductive: Normal uterus and ovaries.  Bartholin gland cyst.

Musculoskeletal. Multilevel degenerative disc disease and facet
arthrosis. No bony spinal canal stenosis.

Other: None.
IMPRESSION: 1. Uncomplicated proximal sigmoid colon diverticulitis without free
intraperitoneal air or abscess/fluid collection.
2.  Aortic Atherosclerosis ([id number]).

## 2019-09-04 ENCOUNTER — Emergency Department (HOSPITAL_COMMUNITY): Payer: No Typology Code available for payment source

## 2019-09-04 ENCOUNTER — Encounter (HOSPITAL_COMMUNITY): Payer: Self-pay | Admitting: *Deleted

## 2019-09-04 ENCOUNTER — Other Ambulatory Visit: Payer: Self-pay

## 2019-09-04 ENCOUNTER — Emergency Department (HOSPITAL_COMMUNITY)
Admission: EM | Admit: 2019-09-04 | Discharge: 2019-09-04 | Disposition: A | Discharge status: Home / Self Care | Payer: No Typology Code available for payment source | Attending: Emergency Medicine | Admitting: Emergency Medicine

## 2019-09-04 DIAGNOSIS — Z87891 Personal history of nicotine dependence: Secondary | ICD-10-CM | POA: Insufficient documentation

## 2019-09-04 DIAGNOSIS — R040 Epistaxis: Secondary | ICD-10-CM | POA: Insufficient documentation

## 2019-09-04 DIAGNOSIS — Z79899 Other long term (current) drug therapy: Secondary | ICD-10-CM | POA: Insufficient documentation

## 2019-09-04 DIAGNOSIS — M25512 Pain in left shoulder: Secondary | ICD-10-CM | POA: Insufficient documentation

## 2019-09-04 DIAGNOSIS — Z7982 Long term (current) use of aspirin: Secondary | ICD-10-CM | POA: Insufficient documentation

## 2019-09-04 DIAGNOSIS — M25552 Pain in left hip: Secondary | ICD-10-CM | POA: Insufficient documentation

## 2019-09-04 DIAGNOSIS — F1210 Cannabis abuse, uncomplicated: Secondary | ICD-10-CM | POA: Insufficient documentation

## 2019-09-04 MED ORDER — ACETAMINOPHEN 325 MG PO TABS
650.0000 mg | ORAL_TABLET | Freq: Once | ORAL | Status: AC
  Administered 2019-09-04: 08:00:00 650 mg via ORAL
  Filled 2019-09-04: qty 2

## 2019-09-04 MED ORDER — INDOMETHACIN 25 MG PO CAPS: 50.0000 mg | ORAL_CAPSULE | Freq: Three times a day (TID) | ORAL | 0 refills | Status: AC | PRN

## 2019-09-04 MED ORDER — OXYMETAZOLINE HCL 0.05 % NA SOLN
1.0000 | Freq: Once | NASAL | Status: AC
  Administered 2019-09-04: 1 via NASAL
  Filled 2019-09-04: qty 30

## 2019-09-04 NOTE — ED Provider Notes (Signed)
This patient is a very pleasant 54 year old female who awoke this morning and had some epistaxis.  On my exam there is no signs of bleeding on either nostril, she is widely patent in both nostrils and the back of the mouth which is clear of any blood.  This is completely stopped.  She is not anticoagulated.  She also complains of pain in the left shoulder and the left hip for about 1 week for which she has been using topical lidocaine.  On my exam she has mild tenderness in the proximal flexor muscles of the left thigh but is able to fully passively range the hip without difficulty.  Her left shoulder has some tenderness with active range of motion and with tenderness over the muscles of the bicep and the deltoid, she has decreased range of motion to external and internal rotation.  I have looked at her x-rays and see no signs of fractures or dislocations, there is some arthritis about the shoulder.  She will need referral to orthopedics for this decreased range of motion and tenderness in the shoulder however there is not appear to be an acute etiology.  She is not febrile and would have no reason to have a septic joint either.  She was given precautions for return and is agreeable, will discharge with anti-inflammatories and orthopedic follow-up  Medical screening examination/treatment/procedure(s) were conducted as a shared visit with non-physician practitioner(s) and myself.  I personally evaluated the patient during the encounter.  Clinical Impression:   Final diagnoses:  Right-sided epistaxis  Nontraumatic pain of left shoulder  Left hip pain         Noemi Chapel, MD 09/08/19 [phone]

## 2019-09-04 NOTE — ED Notes (Signed)
Pt in xray

## 2019-09-04 NOTE — ED Provider Notes (Signed)
[facility] EMERGENCY DEPARTMENT Provider Note   CSN: [id number] Arrival date & time: 09/04/19  [id number]     History Chief Complaint  Patient presents with  . Epistaxis    Christina Cannon is a 54 y.o. female with past medical history significant for diverticulitis presents to emergency room today with chief complaint of acute onset of epistaxis.  This started approximately 1 hour prior to arrival.  Patient states when she got out of the shower this morning she was wiping of her face and she noticed there was blood on the towel.  She looked in the mirror and saw that her right nare was bleeding.  She tried holding pressure but bleeding continued.  She states it was brisk.  Bleeding did not stop so she came to the emergency department.  She is also presenting with a week of left shoulder and left hip pain. She denies any known injury.  She states the pain has been intermittent.  She describes the pain as an aching sensation.  She states the pain is worse with movement.  She works as a Diplomatic Services operational officer in [employer] and states she does not have pain when up and walking around but when she sits down and takes a break she notices the pain upon trying to stand up again.  She has been rubbing lidocaine on her shoulder and took Goody powder yesterday.  She noticed minimal symptom improvement. She has also noticed intermittent numbness in her left arm, none currently. Patient is not anticoagulated, denies history of hypertension. She denies fever, chills, facial trauma, headache, blurry vision, neck pain, chest pain, shortness of breath.   Past Medical History:  Diagnosis Date  . Diverticulitis     Patient Active Problem List   Diagnosis Date Noted  . History of colonic diverticulitis 07/09/2017  . Primary osteoarthritis of both knees 07/09/2017  . Primary osteoarthritis of both ankles 07/09/2017  . Tobacco dependence 07/09/2017  . Callous ulcer, limited to breakdown of skin ([place])  07/09/2017  . Elevated blood pressure reading 07/09/2017  . Skin tag of vulva 08/10/2015    Past Surgical History:  Procedure Laterality Date  . CHOLECYSTECTOMY    . HAND SURGERY       OB History   No obstetric history on file.     Family History  Problem Relation Age of Onset  . Diabetes Mother   . Hypertension Mother   . Diabetes Father   . Hypertension Father   . Heart failure Father     Social History   Tobacco Use  . Smoking status: Former Smoker    Packs/day: 0.50    Types: Cigarettes  . Smokeless tobacco: Never Used  Substance Use Topics  . Alcohol use: No  . Drug use: No    Types: Marijuana    Comment: Prior use    Home Medications Prior to Admission medications   Medication Sig Start Date End Date Taking? Authorizing Provider  acetaminophen (TYLENOL) 500 MG tablet Take 500 mg every 6 (six) hours as needed by mouth (for pain).    [provider]  aspirin 325 MG EC tablet Take 325 mg every 6 (six) hours as needed by mouth for pain.     [provider]  celecoxib (CELEBREX) 200 MG capsule Take 1 capsule (200 mg total) by mouth daily. 07/09/17   Ladell Pier, MD  diclofenac sodium (VOLTAREN) 1 % GEL Apply 4 g topically 4 (four) times daily. 10/08/17   Wynetta Emery,  Dalbert Batman, MD  indomethacin (INDOCIN) 25 MG capsule Take 2 capsules (50 mg total) by mouth 3 (three) times daily with meals as needed for up to 14 days. 09/04/19 09/18/19  , Verline Lema E, PA-C  ondansetron (ZOFRAN ODT) 8 MG disintegrating tablet 8mg  ODT q4 hours prn nausea Patient not taking: Reported on 07/09/2017 06/20/17   Veryl Speak, MD    Allergies    Ceftin [cefuroxime axetil]  Review of Systems   Review of Systems  All other systems are reviewed and are negative for acute change except as noted in the HPI.   Physical Exam Updated Vital Signs BP (!) 144/86 (BP Location: Right Arm)   Pulse 84   Temp 98.6 F (37 C) (Oral)   Resp 20   SpO2 99%   Physical  Exam Vitals and nursing note reviewed.  Constitutional:      General: She is not in acute distress.    Appearance: She is not ill-appearing.  HENT:     Head: Normocephalic and atraumatic.     Right Ear: Tympanic membrane and external ear normal.     Left Ear: Tympanic membrane and external ear normal.     Nose: Nose normal. No nasal tenderness.     Right Nostril: No foreign body, epistaxis, septal hematoma or occlusion.     Left Nostril: No foreign body, epistaxis, septal hematoma or occlusion.     Comments: No bleeding noted to right or left nare    Mouth/Throat:     Mouth: Mucous membranes are moist.     Pharynx: Oropharynx is clear.  Eyes:     General: No scleral icterus.       Right eye: No discharge.        Left eye: No discharge.     Extraocular Movements: Extraocular movements intact.     Conjunctiva/sclera: Conjunctivae normal.     Pupils: Pupils are equal, round, and reactive to light.  Neck:     Vascular: No JVD.  Cardiovascular:     Rate and Rhythm: Normal rate and regular rhythm.     Pulses: Normal pulses.          Radial pulses are 2+ on the right side and 2+ on the left side.     Heart sounds: Normal heart sounds.  Pulmonary:     Comments: Lungs clear to auscultation in all fields. Symmetric chest rise. No wheezing, rales, or rhonchi. Abdominal:     Comments: Abdomen is soft, non-distended, and non-tender in all quadrants. No rigidity, no guarding. No peritoneal signs.  Musculoskeletal:        General: Normal range of motion.     Cervical back: Normal range of motion.     Comments: Decreased ROM of left shoulder secondary to pain. Joint is not warm, no overlying erythema, signs of effusion. No swelling noted.   Full ROM of left hip without overlying skin changes. Bony tenderness present. No swelling noted to bilateral lower ex  Skin:    General: Skin is warm and dry.     Capillary Refill: Capillary refill takes less than 2 seconds.  Neurological:     Mental  Status: She is oriented to person, place, and time.     GCS: GCS eye subscore is 4. GCS verbal subscore is 5. GCS motor subscore is 6.     Comments: Fluent speech, no facial droop.  Psychiatric:        Behavior: Behavior normal.       ED Results /  Procedures / Treatments   Labs (all labs ordered are listed, but only abnormal results are displayed) Labs Reviewed - No data to display  EKG None  Radiology DG Shoulder Left  Result Date: 09/04/2019 CLINICAL DATA:  Left shoulder pain. No specific injury. EXAM: LEFT SHOULDER - 2+ VIEW COMPARISON:  None FINDINGS: Mild glenohumeral joint degenerative changes and moderate AC joint degenerative changes. Os acromial noted. Mild lateral downsloping and undersurface spurring change from the acromion with narrowing of the humeroacromial space and slight flattening and spurring along the greater tuberosity. This could suggest rotator cuff disease or impingement. The visualized left ribs are intact and the visualized left lung is grossly clear. IMPRESSION: 1. Plain film findings could suggest bony impingement and rotator cuff disease. 2. No acute bony findings. Electronically Signed   By: Marijo Sanes M.D.   On: 09/04/2019 08:07   DG Hip Unilat W or Wo Pelvis 2-3 Views Left  Result Date: 09/04/2019 CLINICAL DATA:  Left hip pain. No specific injury. EXAM: DG HIP (WITH OR WITHOUT PELVIS) 2-3V LEFT COMPARISON:  CT scan 06/20/2017 FINDINGS: Mild/moderate bilateral hip joint degenerative changes. No hip fracture or plain film evidence of AVN. Enthesopathic changes are noted around both hips and also along the iliac crests. The pubic symphysis and SI joints are intact. No erosive changes. No bone lesions. IMPRESSION: Mild/moderate bilateral hip joint degenerative changes but no acute bony findings. Electronically Signed   By: Marijo Sanes M.D.   On: 09/04/2019 08:10    Procedures Procedures (including critical care time)  Medications Ordered in ED  Medications  oxymetazoline (AFRIN) 0.05 % nasal spray 1 spray (1 spray Each Nare Given 09/04/19 0750)  acetaminophen (TYLENOL) tablet 650 mg (650 mg Oral Given 09/04/19 0749)    ED Course  I have reviewed the triage vital signs and the nursing notes.  Pertinent labs & imaging results that were available during my care of the patient were reviewed by me and considered in my medical decision making (see chart for details).    MDM Rules/Calculators/A&P                      Patient seen and examined. Patient presents awake, alert, hemodynamically stable, afebrile, non toxic.  On my exam her epistaxis has already resolved.  No source of bleeding seen in either nare.  Nares are patent bilaterally.  No history of hypertension or anticoagulation use.  Blood pressures here are normotensive. She does have bony tenderness to her left shoulder with decreased range of motion.  She is neurovascularly intact.  No overlying skin changes or erythema or swelling noted.  Low suspicion for a septic joint given her reassuring exam and vitals. Xrays of left shoulder viewed by me shows arthritic changes, no acute fracture or dislocation.  Possible bony impingement or rotator cuff disease. Xray of left hip without without acute fracture or dislocation. Given no acute traumatic findings and epistaxis stopped, will discharge home with prescription for indomethacin x3 daily prn for pain.  The patient appears reasonably screened and/or stabilized for discharge and I doubt any other medical condition or other [facility] requiring further screening, evaluation, or treatment in the ED at this time prior to discharge. The patient is safe for discharge with strict return precautions discussed. Recommend orthopedic follow up for her shoulder pain. The patient was discussed with and seen by Dr. Sabra Heck who agrees with the treatment plan.    Portions of this note were generated with Dragon dictation  software. Dictation errors may occur  despite best attempts at proofreading.    Final Clinical Impression(s) / ED Diagnoses Final diagnoses:  Right-sided epistaxis  Nontraumatic pain of left shoulder  Left hip pain    Rx / DC Orders ED Discharge Orders         Ordered    indomethacin (INDOCIN) 25 MG capsule  3 times daily with meals PRN     09/04/19 0755           Cherre Robins, PA-C 09/04/19 1826    Noemi Chapel, MD 09/08/19 [phone]

## 2019-09-04 NOTE — ED Triage Notes (Signed)
Pt with right sided nosebleed started around 0500 this morning,pt denies blood thinners. Pt also reporting a week of pain in her left shoulder and numbness in the left arm and upper left leg. Took goody powder last night for the pain.

## 2019-09-04 NOTE — Discharge Instructions (Addendum)
You have been seen today for nose bleed, left shoulder and hip pain. Please read and follow all provided instructions. Return to the emergency room for worsening condition or new concerning symptoms.    Your nose stop bleeding in the emergency department. We had you use afrin to help stop the bleeding from reoccurring.  X-ray of your left hip is normal.  The x-ray of your left shoulder shows possible arthritis.    1. Medications:  Prescription sent to indomethacin. This is an inflammatory medication.  Please take as prescribed.  Take additional Advil, ibuprofen, Aleve, Motrin, Goody powder with medicines as they are all similar.  Continue usual home medications Take medications as prescribed. Please review all of the medicines and only take them if you do not have an allergy to them.   2. Treatment: rest, drink plenty of fluids  3. Follow Up:  Please follow up with primary care provider by scheduling an appointment as soon as possible for a visit for symptom recheck -Also recommend you follow up with orthopedics to further have your shoulder pain evaluated.   It is also a possibility that you have an allergic reaction to any of the medicines that you have been prescribed - Everybody reacts differently to medications and while MOST people have no trouble with most medicines, you may have a reaction such as nausea, vomiting, rash, swelling, shortness of breath. If this is the case, please stop taking the medicine immediately and contact your physician.  ?

## 2021-08-31 ENCOUNTER — Encounter (HOSPITAL_COMMUNITY): Payer: Self-pay | Admitting: *Deleted

## 2021-08-31 ENCOUNTER — Emergency Department (HOSPITAL_COMMUNITY)
Admission: EM | Admit: 2021-08-31 | Discharge: 2021-08-31 | Disposition: A | Discharge status: Home / Self Care | Payer: No Typology Code available for payment source | Attending: Emergency Medicine | Admitting: Emergency Medicine

## 2021-08-31 ENCOUNTER — Emergency Department (HOSPITAL_COMMUNITY): Payer: No Typology Code available for payment source

## 2021-08-31 ENCOUNTER — Other Ambulatory Visit: Payer: Self-pay

## 2021-08-31 DIAGNOSIS — K5732 Diverticulitis of large intestine without perforation or abscess without bleeding: Secondary | ICD-10-CM | POA: Insufficient documentation

## 2021-08-31 DIAGNOSIS — K5792 Diverticulitis of intestine, part unspecified, without perforation or abscess without bleeding: Secondary | ICD-10-CM

## 2021-08-31 DIAGNOSIS — Z7982 Long term (current) use of aspirin: Secondary | ICD-10-CM | POA: Insufficient documentation

## 2021-08-31 LAB — CBC WITH DIFFERENTIAL/PLATELET
Abs Immature Granulocytes: 0.05 10*3/uL (ref 0.00–0.07)
Basophils Absolute: 0 10*3/uL (ref 0.0–0.1)
Basophils Relative: 0 %
Eosinophils Absolute: 0.1 10*3/uL (ref 0.0–0.5)
Eosinophils Relative: 1 %
HCT: 42.3 % (ref 36.0–46.0)
Hemoglobin: 14.4 g/dL (ref 12.0–15.0)
Immature Granulocytes: 1 %
Lymphocytes Relative: 25 %
Lymphs Abs: 2.2 10*3/uL (ref 0.7–4.0)
MCH: 29.1 pg (ref 26.0–34.0)
MCHC: 34 g/dL (ref 30.0–36.0)
MCV: 85.6 fL (ref 80.0–100.0)
Monocytes Absolute: 0.6 10*3/uL (ref 0.1–1.0)
Monocytes Relative: 7 %
Neutro Abs: 5.9 10*3/uL (ref 1.7–7.7)
Neutrophils Relative %: 66 %
Platelets: 233 10*3/uL (ref 150–400)
RBC: 4.94 MIL/uL (ref 3.87–5.11)
RDW: 13.4 % (ref 11.5–15.5)
WBC: 9 10*3/uL (ref 4.0–10.5)
nRBC: 0 % (ref 0.0–0.2)

## 2021-08-31 LAB — COMPREHENSIVE METABOLIC PANEL
ALT: 14 U/L (ref 0–44)
AST: 17 U/L (ref 15–41)
Albumin: 3.7 g/dL (ref 3.5–5.0)
Alkaline Phosphatase: 58 U/L (ref 38–126)
Anion gap: 8 (ref 5–15)
BUN: 9 mg/dL (ref 6–20)
CO2: 27 mmol/L (ref 22–32)
Calcium: 9.2 mg/dL (ref 8.9–10.3)
Chloride: 106 mmol/L (ref 98–111)
Creatinine, Ser: 0.98 mg/dL (ref 0.44–1.00)
GFR, Estimated: >60 mL/min (ref >60)
Glucose, Bld: 100 mg/dL — ABNORMAL HIGH (ref 70–99)
Potassium: 3.8 mmol/L (ref 3.5–5.1)
Sodium: 141 mmol/L (ref 135–145)
Total Bilirubin: 0.8 mg/dL (ref 0.3–1.2)
Total Protein: 7.3 g/dL (ref 6.5–8.1)

## 2021-08-31 LAB — URINALYSIS, ROUTINE W REFLEX MICROSCOPIC
Bacteria, UA: NONE SEEN
Bilirubin Urine: NEGATIVE
Glucose, UA: NEGATIVE mg/dL
Ketones, ur: NEGATIVE mg/dL
Leukocytes,Ua: NEGATIVE
Nitrite: NEGATIVE
Protein, ur: NEGATIVE mg/dL
Specific Gravity, Urine: 1.02 (ref 1.005–1.030)
pH: 5 (ref 5.0–8.0)

## 2021-08-31 MED ORDER — ONDANSETRON 4 MG PO TBDP
4.0000 mg | ORAL_TABLET | Freq: Once | ORAL | Status: AC
  Administered 2021-08-31: 4 mg via ORAL
  Filled 2021-08-31: qty 1

## 2021-08-31 MED ORDER — AMOXICILLIN-POT CLAVULANATE 875-125 MG PO TABS: 1.0000 | ORAL_TABLET | Freq: Two times a day (BID) | ORAL | 0 refills | Status: DC

## 2021-08-31 MED ORDER — HYDROCODONE-ACETAMINOPHEN 5-325 MG PO TABS: 1.0000 | ORAL_TABLET | Freq: Four times a day (QID) | ORAL | 0 refills | Status: DC | PRN

## 2021-08-31 MED ORDER — IOHEXOL 300 MG/ML  SOLN
100.0000 mL | Freq: Once | INTRAMUSCULAR | Status: AC | PRN
  Administered 2021-08-31: 100 mL via INTRAVENOUS

## 2021-08-31 NOTE — ED Provider Notes (Signed)
[facility] EMERGENCY DEPARTMENT  Provider Note  CSN: [id number] Arrival date & time: 08/31/21 0945  History Chief Complaint  Patient presents with   Abdominal Pain    Christina Cannon is a 56 y.o. female with history of diverticulitis reports 2 weeks of worsening LLQ abdominal pain, radiating into her leg, worse with movement. Associated with urinary urgency and dysuria, but not improved with cranberry juice. She had a fever at home earlier this week. No vomiting. Some possible blood in stool.    Home Medications Prior to Admission medications   Medication Sig Start Date End Date Taking? Authorizing Provider  amoxicillin-clavulanate (AUGMENTIN) 875-125 MG tablet Take 1 tablet by mouth every 12 (twelve) hours. 08/31/21  Yes Truddie Hidden, MD  HYDROcodone-acetaminophen (NORCO/VICODIN) 5-325 MG tablet Take 1 tablet by mouth every 6 (six) hours as needed for severe pain. 08/31/21  Yes Truddie Hidden, MD  acetaminophen (TYLENOL) 500 MG tablet Take 500 mg every 6 (six) hours as needed by mouth (for pain).    [provider]  aspirin 325 MG EC tablet Take 325 mg every 6 (six) hours as needed by mouth for pain.     [provider]  celecoxib (CELEBREX) 200 MG capsule Take 1 capsule (200 mg total) by mouth daily. 07/09/17   Ladell Pier, MD  diclofenac sodium (VOLTAREN) 1 % GEL Apply 4 g topically 4 (four) times daily. 10/08/17   Ladell Pier, MD  ondansetron (ZOFRAN ODT) 8 MG disintegrating tablet 8mg  ODT q4 hours prn nausea Patient not taking: Reported on 07/09/2017 06/20/17   Veryl Speak, MD     Allergies    Ceftin [cefuroxime axetil]   Review of Systems   Review of Systems Please see HPI for pertinent positives and negatives  Physical Exam BP 137/79    Pulse 67    Temp 98.3 F (36.8 C) (Oral)    Resp 16    SpO2 98%   Physical Exam Vitals and nursing note reviewed.  Constitutional:      Appearance: Normal appearance.   HENT:     Head: Normocephalic and atraumatic.     Nose: Nose normal.     Mouth/Throat:     Mouth: Mucous membranes are moist.  Eyes:     Extraocular Movements: Extraocular movements intact.     Conjunctiva/sclera: Conjunctivae normal.  Cardiovascular:     Rate and Rhythm: Normal rate.  Pulmonary:     Effort: Pulmonary effort is normal.     Breath sounds: Normal breath sounds.  Abdominal:     General: Abdomen is flat.     Palpations: Abdomen is soft.     Tenderness: There is abdominal tenderness in the left lower quadrant. There is no guarding. Negative signs include Murphy's sign and McBurney's sign.  Musculoskeletal:        General: No swelling. Normal range of motion.     Cervical back: Neck supple.  Skin:    General: Skin is warm and dry.  Neurological:     General: No focal deficit present.     Mental Status: She is alert.  Psychiatric:        Mood and Affect: Mood normal.    ED Results / Procedures / Treatments   EKG None  Procedures Procedures  Medications Ordered in the ED Medications  ondansetron (ZOFRAN-ODT) disintegrating tablet 4 mg (4 mg Oral Given 08/31/21 1046)  iohexol (OMNIPAQUE) 300 MG/ML solution 100 mL (100 mLs Intravenous Contrast Given 08/31/21  2020)    Initial Impression and Plan  Patient here with LLQ pain, some urinary symptoms, also has had similar with diverticulitis in the past. She had labs done in triage which were normal including CBC, BMP and UA. No signs of UTI as the source of her pain. Will check CT to evaluate other causes such as diverticulitis or pelvic issues. She has declined pain medications at this time.  ED Course   Clinical Course as of 08/31/21 2141  Thu Aug 31, 2021  1093 CT shows uncomplicated diverticulitis. Will d/c with Rx for Augmentin, Pain meds and PCP follow up. RTED for any other concerns.  [CS]    Clinical Course User Index [CS] Truddie Hidden, MD     MDM Rules/Calculators/A&P Medical Decision  Making Amount and/or Complexity of Data Reviewed Labs: ordered. Decision-making details documented in ED Course. Radiology: ordered and independent interpretation performed. Decision-making details documented in ED Course.  Risk Prescription drug management. Decision regarding hospitalization.    Final Clinical Impression(s) / ED Diagnoses Final diagnoses:  Diverticulitis    Rx / DC Orders ED Discharge Orders          Ordered    amoxicillin-clavulanate (AUGMENTIN) 875-125 MG tablet  Every 12 hours        08/31/21 2139    HYDROcodone-acetaminophen (NORCO/VICODIN) 5-325 MG tablet  Every 6 hours PRN        08/31/21 2140             Truddie Hidden, MD 08/31/21 2141

## 2021-08-31 NOTE — ED Triage Notes (Signed)
Pt reports lower abd pain x 2 weeks, pain radiates down her leg when she walks. Has pain with urination and blood in her stools. Has nausea and fever/chills.

## 2021-08-31 NOTE — ED Provider Triage Note (Signed)
Emergency Medicine Provider Triage Evaluation Note  Christina Cannon , a 56 y.o. female  was evaluated in triage.  Pt complains of dysuria.  Patient states that for 1-1/2 to 2 weeks she has had urinary urgency, frequency and dysuria.  She states that she has been trying home remedies such as cranberry juice and lots of water without improvement in her symptoms.  She states that her suprapubic abdominal pain has worsened as well as having bilateral low back pain.  She states that her urine is concentrated.  She has had tactile fever at home as well as nausea and vomiting.  She denies any vaginal discharge.  Denies diarrhea.  Review of Systems  Positive: See above Negative:  Physical Exam  BP 131/90 (BP Location: Right Arm)    Pulse 74    Temp 98.8 F (37.1 C) (Oral)    Resp 17    SpO2 91%  Gen:   Awake, no distress   Resp:  Normal effort  MSK:   Moves extremities without difficulty  Other:  Suprapubic and left lower quadrant abdominal pain to palpation.  Abdomen is soft.  Positive bowel sounds.  Left-sided CVA tenderness  Medical Decision Making  Medically screening exam initiated at 10:39 AM.  Appropriate orders placed.  Christina Cannon was informed that the remainder of the evaluation will be completed by another provider, this initial triage assessment does not replace that evaluation, and the importance of remaining in the ED until their evaluation is complete.     Mickie Hillier, PA-C 08/31/21 1040

## 2021-09-01 LAB — URINE CULTURE: Culture: NO GROWTH

## 2021-12-20 ENCOUNTER — Emergency Department (HOSPITAL_COMMUNITY): Payer: Commercial Managed Care - HMO

## 2021-12-20 ENCOUNTER — Encounter (HOSPITAL_COMMUNITY): Payer: Self-pay

## 2021-12-20 ENCOUNTER — Inpatient Hospital Stay (HOSPITAL_COMMUNITY)
Admission: EM | Admit: 2021-12-20 | Discharge: 2021-12-25 | DRG: 392 | Disposition: A | Discharge status: Home / Self Care | Payer: Commercial Managed Care - HMO | Source: Ambulatory Visit | Attending: Internal Medicine | Admitting: Internal Medicine

## 2021-12-20 ENCOUNTER — Encounter (HOSPITAL_COMMUNITY): Payer: Self-pay | Admitting: Emergency Medicine

## 2021-12-20 ENCOUNTER — Ambulatory Visit (HOSPITAL_COMMUNITY)
Admission: EM | Admit: 2021-12-20 | Discharge: 2021-12-20 | Disposition: A | Discharge status: Home / Self Care | Payer: Commercial Managed Care - HMO | Attending: Internal Medicine | Admitting: Internal Medicine

## 2021-12-20 DIAGNOSIS — K529 Noninfective gastroenteritis and colitis, unspecified: Secondary | ICD-10-CM | POA: Diagnosis present

## 2021-12-20 DIAGNOSIS — Z720 Tobacco use: Secondary | ICD-10-CM | POA: Diagnosis present

## 2021-12-20 DIAGNOSIS — Z833 Family history of diabetes mellitus: Secondary | ICD-10-CM

## 2021-12-20 DIAGNOSIS — E876 Hypokalemia: Secondary | ICD-10-CM | POA: Diagnosis present

## 2021-12-20 DIAGNOSIS — R9431 Abnormal electrocardiogram [ECG] [EKG]: Secondary | ICD-10-CM | POA: Diagnosis present

## 2021-12-20 DIAGNOSIS — D72829 Elevated white blood cell count, unspecified: Secondary | ICD-10-CM | POA: Diagnosis present

## 2021-12-20 DIAGNOSIS — K5792 Diverticulitis of intestine, part unspecified, without perforation or abscess without bleeding: Principal | ICD-10-CM | POA: Diagnosis present

## 2021-12-20 DIAGNOSIS — Z6836 Body mass index (BMI) 36.0-36.9, adult: Secondary | ICD-10-CM

## 2021-12-20 DIAGNOSIS — K5732 Diverticulitis of large intestine without perforation or abscess without bleeding: Secondary | ICD-10-CM | POA: Diagnosis not present

## 2021-12-20 DIAGNOSIS — Z8249 Family history of ischemic heart disease and other diseases of the circulatory system: Secondary | ICD-10-CM

## 2021-12-20 DIAGNOSIS — R1030 Lower abdominal pain, unspecified: Secondary | ICD-10-CM

## 2021-12-20 DIAGNOSIS — E669 Obesity, unspecified: Secondary | ICD-10-CM | POA: Diagnosis present

## 2021-12-20 DIAGNOSIS — R110 Nausea: Secondary | ICD-10-CM

## 2021-12-20 DIAGNOSIS — Z888 Allergy status to other drugs, medicaments and biological substances status: Secondary | ICD-10-CM

## 2021-12-20 DIAGNOSIS — F17210 Nicotine dependence, cigarettes, uncomplicated: Secondary | ICD-10-CM | POA: Diagnosis present

## 2021-12-20 LAB — POCT URINALYSIS DIPSTICK, ED / UC
Glucose, UA: NEGATIVE mg/dL
Ketones, ur: NEGATIVE mg/dL
Leukocytes,Ua: NEGATIVE
Nitrite: NEGATIVE
Protein, ur: NEGATIVE mg/dL
Specific Gravity, Urine: 1.02 (ref 1.005–1.030)
Urobilinogen, UA: 0.2 mg/dL (ref 0.0–1.0)
pH: 5.5 (ref 5.0–8.0)

## 2021-12-20 LAB — COMPREHENSIVE METABOLIC PANEL WITH GFR
ALT: 12 U/L (ref 0–44)
AST: 14 U/L — ABNORMAL LOW (ref 15–41)
Albumin: 3.4 g/dL — ABNORMAL LOW (ref 3.5–5.0)
Alkaline Phosphatase: 64 U/L (ref 38–126)
Anion gap: 6 (ref 5–15)
BUN: 7 mg/dL (ref 6–20)
CO2: 27 mmol/L (ref 22–32)
Calcium: 8.8 mg/dL — ABNORMAL LOW (ref 8.9–10.3)
Chloride: 105 mmol/L (ref 98–111)
Creatinine, Ser: 0.91 mg/dL (ref 0.44–1.00)
GFR, Estimated: >60 mL/min
Glucose, Bld: 114 mg/dL — ABNORMAL HIGH (ref 70–99)
Potassium: 3 mmol/L — ABNORMAL LOW (ref 3.5–5.1)
Sodium: 138 mmol/L (ref 135–145)
Total Bilirubin: 0.7 mg/dL (ref 0.3–1.2)
Total Protein: 7.1 g/dL (ref 6.5–8.1)

## 2021-12-20 LAB — URINALYSIS, ROUTINE W REFLEX MICROSCOPIC
Bacteria, UA: NONE SEEN
Bilirubin Urine: NEGATIVE
Glucose, UA: NEGATIVE mg/dL
Ketones, ur: NEGATIVE mg/dL
Leukocytes,Ua: NEGATIVE
Nitrite: NEGATIVE
Protein, ur: NEGATIVE mg/dL
Specific Gravity, Urine: 1.019 (ref 1.005–1.030)
pH: 7 (ref 5.0–8.0)

## 2021-12-20 LAB — CBC WITH DIFFERENTIAL/PLATELET
Abs Immature Granulocytes: 0.05 10*3/uL (ref 0.00–0.07)
Basophils Absolute: 0 10*3/uL (ref 0.0–0.1)
Basophils Relative: 0 %
Eosinophils Absolute: 0.1 10*3/uL (ref 0.0–0.5)
Eosinophils Relative: 1 %
HCT: 42.7 % (ref 36.0–46.0)
Hemoglobin: 14.3 g/dL (ref 12.0–15.0)
Immature Granulocytes: 1 %
Lymphocytes Relative: 19 %
Lymphs Abs: 2.1 10*3/uL (ref 0.7–4.0)
MCH: 28.3 pg (ref 26.0–34.0)
MCHC: 33.5 g/dL (ref 30.0–36.0)
MCV: 84.6 fL (ref 80.0–100.0)
Monocytes Absolute: 0.6 10*3/uL (ref 0.1–1.0)
Monocytes Relative: 6 %
Neutro Abs: 7.9 10*3/uL — ABNORMAL HIGH (ref 1.7–7.7)
Neutrophils Relative %: 73 %
Platelets: 219 10*3/uL (ref 150–400)
RBC: 5.05 MIL/uL (ref 3.87–5.11)
RDW: 13.1 % (ref 11.5–15.5)
WBC: 10.7 10*3/uL — ABNORMAL HIGH (ref 4.0–10.5)
nRBC: 0 % (ref 0.0–0.2)

## 2021-12-20 MED ORDER — ONDANSETRON HCL 4 MG/2ML IJ SOLN
4.0000 mg | Freq: Four times a day (QID) | INTRAMUSCULAR | Status: DC | PRN
  Administered 2021-12-21: 4 mg via INTRAVENOUS
  Filled 2021-12-20: qty 2

## 2021-12-20 MED ORDER — HYDROMORPHONE HCL 1 MG/ML IJ SOLN
0.5000 mg | Freq: Once | INTRAMUSCULAR | Status: AC
  Administered 2021-12-20: 0.5 mg via INTRAVENOUS
  Filled 2021-12-20: qty 1

## 2021-12-20 MED ORDER — SODIUM CHLORIDE 0.9 % IV SOLN: INTRAVENOUS | Status: DC

## 2021-12-20 MED ORDER — ONDANSETRON 4 MG PO TBDP
4.0000 mg | ORAL_TABLET | Freq: Once | ORAL | Status: AC
  Administered 2021-12-20: 4 mg via ORAL

## 2021-12-20 MED ORDER — HYDROCODONE-ACETAMINOPHEN 5-325 MG PO TABS
1.0000 | ORAL_TABLET | Freq: Four times a day (QID) | ORAL | Status: DC | PRN
  Administered 2021-12-20 – 2021-12-21 (×3): 1 via ORAL
  Filled 2021-12-20 (×3): qty 1

## 2021-12-20 MED ORDER — METRONIDAZOLE 500 MG/100ML IV SOLN
500.0000 mg | Freq: Once | INTRAVENOUS | Status: DC
  Administered 2021-12-20: 500 mg via INTRAVENOUS
  Filled 2021-12-20: qty 100

## 2021-12-20 MED ORDER — SODIUM CHLORIDE 0.9% FLUSH
3.0000 mL | Freq: Two times a day (BID) | INTRAVENOUS | Status: DC
  Administered 2021-12-20 – 2021-12-24 (×8): 3 mL via INTRAVENOUS

## 2021-12-20 MED ORDER — ONDANSETRON HCL 4 MG/2ML IJ SOLN
4.0000 mg | Freq: Once | INTRAMUSCULAR | Status: AC
  Administered 2021-12-20: 4 mg via INTRAVENOUS
  Filled 2021-12-20: qty 2

## 2021-12-20 MED ORDER — METRONIDAZOLE 500 MG/100ML IV SOLN: 500.0000 mg | Freq: Two times a day (BID) | INTRAVENOUS | Status: DC

## 2021-12-20 MED ORDER — CIPROFLOXACIN IN D5W 400 MG/200ML IV SOLN: 400.0000 mg | Freq: Two times a day (BID) | INTRAVENOUS | Status: DC

## 2021-12-20 MED ORDER — POTASSIUM CHLORIDE 2 MEQ/ML IV SOLN: INTRAVENOUS | Status: DC

## 2021-12-20 MED ORDER — ALBUTEROL SULFATE (2.5 MG/3ML) 0.083% IN NEBU: 2.5000 mg | INHALATION_SOLUTION | Freq: Four times a day (QID) | RESPIRATORY_TRACT | Status: DC | PRN

## 2021-12-20 MED ORDER — POTASSIUM CHLORIDE IN NACL 20-0.9 MEQ/L-% IV SOLN
INTRAVENOUS | Status: AC
  Filled 2021-12-20 (×2): qty 1000

## 2021-12-20 MED ORDER — ENOXAPARIN SODIUM 40 MG/0.4ML IJ SOSY
40.0000 mg | PREFILLED_SYRINGE | INTRAMUSCULAR | Status: DC
  Administered 2021-12-20 – 2021-12-24 (×4): 40 mg via SUBCUTANEOUS
  Filled 2021-12-20 (×5): qty 0.4

## 2021-12-20 MED ORDER — ONDANSETRON HCL 4 MG PO TABS: 4.0000 mg | ORAL_TABLET | Freq: Four times a day (QID) | ORAL | Status: DC | PRN

## 2021-12-20 MED ORDER — ACETAMINOPHEN 650 MG RE SUPP: 650.0000 mg | Freq: Four times a day (QID) | RECTAL | Status: DC | PRN

## 2021-12-20 MED ORDER — SODIUM CHLORIDE 0.9 % IV BOLUS
1000.0000 mL | Freq: Once | INTRAVENOUS | Status: AC
  Administered 2021-12-20: 1000 mL via INTRAVENOUS

## 2021-12-20 MED ORDER — ACETAMINOPHEN 325 MG PO TABS
ORAL_TABLET | ORAL | Status: AC
  Filled 2021-12-20: qty 3

## 2021-12-20 MED ORDER — POTASSIUM CHLORIDE CRYS ER 20 MEQ PO TBCR
40.0000 meq | EXTENDED_RELEASE_TABLET | Freq: Once | ORAL | Status: AC
  Administered 2021-12-20: 40 meq via ORAL
  Filled 2021-12-20: qty 2

## 2021-12-20 MED ORDER — ACETAMINOPHEN 325 MG PO TABS
650.0000 mg | ORAL_TABLET | Freq: Four times a day (QID) | ORAL | Status: DC | PRN
  Administered 2021-12-21 – 2021-12-24 (×6): 650 mg via ORAL
  Filled 2021-12-20 (×6): qty 2

## 2021-12-20 MED ORDER — CIPROFLOXACIN IN D5W 400 MG/200ML IV SOLN
400.0000 mg | Freq: Once | INTRAVENOUS | Status: DC
  Filled 2021-12-20: qty 200

## 2021-12-20 MED ORDER — ACETAMINOPHEN 325 MG PO TABS
975.0000 mg | ORAL_TABLET | Freq: Once | ORAL | Status: AC
  Administered 2021-12-20: 975 mg via ORAL

## 2021-12-20 MED ORDER — IOHEXOL 300 MG/ML  SOLN
100.0000 mL | Freq: Once | INTRAMUSCULAR | Status: AC | PRN
  Administered 2021-12-20: 100 mL via INTRAVENOUS

## 2021-12-20 MED ORDER — PIPERACILLIN-TAZOBACTAM 3.375 G IVPB
3.3750 g | Freq: Three times a day (TID) | INTRAVENOUS | Status: DC
  Administered 2021-12-20 – 2021-12-25 (×15): 3.375 g via INTRAVENOUS
  Filled 2021-12-20 (×18): qty 50

## 2021-12-20 MED ORDER — ONDANSETRON 4 MG PO TBDP
ORAL_TABLET | ORAL | Status: AC
  Filled 2021-12-20: qty 1

## 2021-12-20 NOTE — Plan of Care (Signed)
  Problem: Education: Goal: Knowledge of General Education information will improve Description: Including pain rating scale, medication(s)/side effects and non-pharmacologic comfort measures Outcome: Progressing   Problem: Health Behavior/Discharge Planning: Goal: Ability to manage health-related needs will improve Outcome: Progressing   Problem: Clinical Measurements: Goal: Ability to maintain clinical measurements within normal limits will improve Outcome: Progressing Goal: Will remain free from infection Outcome: Progressing Goal: Diagnostic test results will improve Outcome: Progressing Goal: Respiratory complications will improve Outcome: Progressing Goal: Cardiovascular complication will be avoided Outcome: Progressing   Problem: Activity: Goal: Risk for activity intolerance will decrease Outcome: Progressing   Problem: Nutrition: Goal: Adequate nutrition will be maintained Outcome: Progressing   Problem: Coping: Goal: Level of anxiety will decrease Outcome: Progressing   Problem: Elimination: Goal: Will not experience complications related to bowel motility Outcome: Progressing Goal: Will not experience complications related to urinary retention Outcome: Progressing   Problem: Pain Managment: Goal: General experience of comfort will improve Outcome: Progressing   Problem: Safety: Goal: Ability to remain free from injury will improve Outcome: Progressing   Problem: Skin Integrity: Goal: Risk for impaired skin integrity will decrease Outcome: Progressing   Problem: Education: Goal: Ability to demonstrate management of disease process will improve Outcome: Progressing Goal: Ability to verbalize understanding of medication therapies will improve Outcome: Progressing Goal: Individualized Educational Video(s) Outcome: Progressing   Problem: Activity: Goal: Capacity to carry out activities will improve Outcome: Progressing   Problem: Cardiac: Goal:  Ability to achieve and maintain adequate cardiopulmonary perfusion will improve Outcome: Progressing   Problem: Education: Goal: Knowledge of disease or condition will improve Outcome: Progressing Goal: Understanding of medication regimen will improve Outcome: Progressing Goal: Individualized Educational Video(s) Outcome: Progressing   Problem: Activity: Goal: Ability to tolerate increased activity will improve Outcome: Progressing   Problem: Cardiac: Goal: Ability to achieve and maintain adequate cardiopulmonary perfusion will improve Outcome: Progressing   Problem: Health Behavior/Discharge Planning: Goal: Ability to safely manage health-related needs after discharge will improve Outcome: Progressing   

## 2021-12-20 NOTE — ED Triage Notes (Signed)
Sent by UC for further eval of 2 weeks of lower abdominal pain with nausea, vomiting and chills. Denies diarrhea.

## 2021-12-20 NOTE — Discharge Instructions (Addendum)
Please go to the emergency department for further evaluation of your abdominal pain as this could be a possible flare up of your diverticulitis.

## 2021-12-20 NOTE — ED Notes (Signed)
Patient is being discharged from the Urgent Care and sent to the Emergency Department via POV . Per Sharyn Lull NP, patient is in need of higher level of care due to severe abdominal pain. Patient is aware and verbalizes understanding of plan of care.  Vitals:   12/20/21 0857 12/20/21 0904  BP:  139/68  Pulse: 81   Resp: 17   Temp: 98.1 F (36.7 C)   SpO2: 100%

## 2021-12-20 NOTE — ED Provider Notes (Signed)
[place]    CSN: [id number] Arrival date & time: 12/20/21  0830      History   Chief Complaint Chief Complaint  Patient presents with  . Abdominal Pain    HPI Christina Cannon is a 56 y.o. female.   Patient presents urgent care for evaluation of right and left lower abdominal pain/pressure that she has had for the last 2 weeks.  She states her urine has been a dark yellow/orange color for the last week, but denies dysuria, urinary frequency, and urgency.  She states that when she urinates, she is only able to void a small amount and feels like she cannot get all of her urine out of her bladder.  She primarily drinks [facility], lemonade, and sweet tea.  She has been trying to drink more water over these last 2 weeks while she has had symptoms. States her mouth has been very dry and she has not been able to wear her upper dentures for this reason. Reports nausea, but denies vomiting. Also reports feeling "freezing even though the temperature in the house is high".  She has not checked her temperature at home. She frequently becomes constipated and has not had a bowel movement in 6 days. She has been taking ibuprofen and over-the-counter Alka-Seltzer for her symptoms with minimal relief. Significant medical history of diverticulitis, but states this does not feel like a diverticulitis flare up. History of cholecystectomy. She still has her appendix. No abnormal uterine bleeding reported or abnormal vaginal discharge.    Abdominal Pain  Past Medical History:  Diagnosis Date  . Diverticulitis     Patient Active Problem List   Diagnosis Date Noted  . History of colonic diverticulitis 07/09/2017  . Primary osteoarthritis of both knees 07/09/2017  . Primary osteoarthritis of both ankles 07/09/2017  . Tobacco dependence 07/09/2017  . Callous ulcer, limited to breakdown of skin ([place]) 07/09/2017  . Elevated blood pressure reading 07/09/2017  . Skin tag of vulva  08/10/2015    Past Surgical History:  Procedure Laterality Date  . CHOLECYSTECTOMY    . HAND SURGERY      OB History   No obstetric history on file.      Home Medications    Prior to Admission medications   Medication Sig Start Date End Date Taking? Authorizing Provider  acetaminophen (TYLENOL) 500 MG tablet Take 500 mg every 6 (six) hours as needed by mouth (for pain).    [provider]  amoxicillin-clavulanate (AUGMENTIN) 875-125 MG tablet Take 1 tablet by mouth every 12 (twelve) hours. 08/31/21   Truddie Hidden, MD  aspirin 325 MG EC tablet Take 325 mg every 6 (six) hours as needed by mouth for pain.     [provider]  celecoxib (CELEBREX) 200 MG capsule Take 1 capsule (200 mg total) by mouth daily. 07/09/17   Ladell Pier, MD  diclofenac sodium (VOLTAREN) 1 % GEL Apply 4 g topically 4 (four) times daily. 10/08/17   Ladell Pier, MD  HYDROcodone-acetaminophen (NORCO/VICODIN) 5-325 MG tablet Take 1 tablet by mouth every 6 (six) hours as needed for severe pain. 08/31/21   Truddie Hidden, MD  ondansetron (ZOFRAN ODT) 8 MG disintegrating tablet '8mg'$  ODT q4 hours prn nausea Patient not taking: Reported on 07/09/2017 06/20/17   Veryl Speak, MD    Family History Family History  Problem Relation Age of Onset  . Diabetes Mother   . Hypertension Mother   . Diabetes Father   .  Hypertension Father   . Heart failure Father     Social History Social History   Tobacco Use  . Smoking status: Former    Packs/day: 0.50    Types: Cigarettes  . Smokeless tobacco: Never  Vaping Use  . Vaping Use: Never used  Substance Use Topics  . Alcohol use: No  . Drug use: No    Types: Marijuana    Comment: Prior use     Allergies   Ceftin [cefuroxime axetil]   Review of Systems Review of Systems  Gastrointestinal:  Positive for abdominal pain.  Per HPI  Physical Exam Triage Vital Signs ED Triage Vitals  Enc Vitals Group     BP 12/20/21 0904  139/68     Pulse Rate 12/20/21 0857 81     Resp 12/20/21 0857 17     Temp 12/20/21 0857 98.1 F (36.7 C)     Temp Source 12/20/21 0857 Oral     SpO2 12/20/21 0857 100 %     Weight --      Height --      Head Circumference --      Peak Flow --      Pain Score 12/20/21 0855 9     Pain Loc --      Pain Edu? --      Excl. in [place]? --    No data found.  Updated Vital Signs BP 139/68   Pulse 81   Temp 98.1 F (36.7 C) (Oral)   Resp 17   SpO2 100%   Visual Acuity Right Eye Distance:   Left Eye Distance:   Bilateral Distance:    Right Eye Near:   Left Eye Near:    Bilateral Near:     Physical Exam Vitals and nursing note reviewed.  Constitutional:      General: She is not in acute distress.    Appearance: Normal appearance. She is well-developed. She is obese. She is ill-appearing and diaphoretic.  HENT:     Head: Normocephalic and atraumatic.     Right Ear: Tympanic membrane, ear canal and external ear normal.     Left Ear: Tympanic membrane, ear canal and external ear normal.     Nose: Nose normal.     Mouth/Throat:     Mouth: Mucous membranes are dry.     Pharynx: No posterior oropharyngeal erythema.  Eyes:     Extraocular Movements: Extraocular movements intact.     Conjunctiva/sclera: Conjunctivae normal.  Cardiovascular:     Rate and Rhythm: Normal rate and regular rhythm.     Heart sounds: Normal heart sounds. No murmur heard.   No friction rub. No gallop.  Pulmonary:     Effort: Pulmonary effort is normal. No respiratory distress.     Breath sounds: Normal breath sounds. No wheezing, rhonchi or rales.  Chest:     Chest wall: No tenderness.  Abdominal:     General: Abdomen is flat and protuberant. Bowel sounds are decreased.     Palpations: Abdomen is soft.     Tenderness: There is abdominal tenderness in the right lower quadrant, suprapubic area and left lower quadrant. There is right CVA tenderness, left CVA tenderness and guarding. Negative signs include  McBurney's sign.     Hernia: No hernia is present.     Comments: Guarding to palpation of right and left lower quadrants of abdomen.   Musculoskeletal:        General: No swelling.     Cervical back:  Normal range of motion and neck supple.  Lymphadenopathy:     Cervical: No cervical adenopathy.  Skin:    General: Skin is warm.     Capillary Refill: Capillary refill takes less than 2 seconds.     Findings: No rash.  Neurological:     General: No focal deficit present.     Mental Status: She is alert and oriented to person, place, and time. Mental status is at baseline.     Motor: No weakness.     Gait: Gait normal.  Psychiatric:        Mood and Affect: Mood normal.        Behavior: Behavior normal.        Thought Content: Thought content normal.        Judgment: Judgment normal.     UC Treatments / Results  Labs (all labs ordered are listed, but only abnormal results are displayed) Labs Reviewed  POCT URINALYSIS DIPSTICK, ED / UC - Abnormal; Notable for the following components:      Result Value   Bilirubin Urine SMALL (*)    Hgb urine dipstick LARGE (*)    All other components within normal limits    EKG   Radiology No results found.  Procedures Procedures (including critical care time)  Medications Ordered in UC Medications  acetaminophen (TYLENOL) tablet 975 mg (has no administration in time range)  ondansetron (ZOFRAN-ODT) disintegrating tablet 4 mg (has no administration in time range)    Initial Impression / Assessment and Plan / UC Course  I have reviewed the triage vital signs and the nursing notes.  Pertinent labs & imaging results that were available during my care of the patient were reviewed by me and considered in my medical decision making (see chart for details).     *** Final Clinical Impressions(s) / UC Diagnoses   Final diagnoses:  None   Discharge Instructions   None    ED Prescriptions   None    PDMP not reviewed this  encounter.

## 2021-12-20 NOTE — ED Triage Notes (Signed)
Pt reports lower abdominal pain for a couple weeks with pain worsening for the last couple days. States constantly feel nauseous, loss of appetite, painful urination and painful BM. Last normal BM was last Thursday. States tried OTC ibuprofen with no relief.

## 2021-12-20 NOTE — H&P (Signed)
History and Physical    Patient: Christina Cannon QQV:[id number] DOB: 1965-10-13 DOA: 12/20/2021 DOS: the patient was seen and examined on 12/20/2021 PCP: Ladell Pier, MD  Patient coming from: sent from urgent care  Chief Complaint:  Chief Complaint  Patient presents with   Abdominal Pain   HPI: Christina Cannon is a 56 y.o. female with medical history significant of diverticulitis who presents with complaints of lower abdominal pain over the last 2 weeks.  She reports that the pain would wax and wane in intensity going from sharp to dull.  Patient reported associated symptoms of intermittent cold chills, nausea, vomiting, mild shortness of breath, and reported initially having some loose stools with blood present after eating watermelon due to feeling constipated.  Her last bowel movement was 6 days ago when she ate watermelon.  She had just recently been diagnosed with acute uncomplicated sigmoid diverticulitis back in 08/2021 for which she was seen in the emergency department.  She had been discharged home on Augmentin and pain medication at that time, but did not follow-up as advised.  Records note she has last hospitalized for diverticulitis in 05/2017 where patient had sigmoid diverticulitis.  At that time it was recommended the patient follow-up with GI to have follow-up colonoscopy, but she had not.  Patient reports that she has never had a colonoscopy.  She does not drink alcohol, but does intermittently smoke a cigarette.  Upon admission into the right department patient was noted to be afebrile with blood pressures 122/67-155/81, and all other vital signs maintained.  Labs significant for WBC 10.7 and potassium 3.  CT scan of the abdomen pelvis sigmoid diverticulitis mildly increased from previous study without perforation or abscess..  Patient been started on ciprofloxacin and metronidazole   Review of Systems: As mentioned in the history of present illness. All other  systems reviewed and are negative. Past Medical History:  Diagnosis Date   Diverticulitis    Past Surgical History:  Procedure Laterality Date   CHOLECYSTECTOMY     HAND SURGERY     Social History:  reports that she has quit smoking. Her smoking use included cigarettes. She smoked an average of .5 packs per day. She has never used smokeless tobacco. She reports that she does not drink alcohol and does not use drugs.  Allergies  Allergen Reactions   Ceftin [Cefuroxime Axetil] Anaphylaxis and Swelling    Throat and face swell. Pt stated she had a regimen in the hospital recently(2018) and did just fine.    Family History  Problem Relation Age of Onset   Diabetes Mother    Hypertension Mother    Diabetes Father    Hypertension Father    Heart failure Father     Prior to Admission medications   Medication Sig Start Date End Date Taking? Authorizing Provider  acetaminophen (TYLENOL) 500 MG tablet Take 500 mg every 6 (six) hours as needed by mouth (for pain).    [provider]  amoxicillin-clavulanate (AUGMENTIN) 875-125 MG tablet Take 1 tablet by mouth every 12 (twelve) hours. 08/31/21   Truddie Hidden, MD  aspirin 325 MG EC tablet Take 325 mg every 6 (six) hours as needed by mouth for pain.     [provider]  celecoxib (CELEBREX) 200 MG capsule Take 1 capsule (200 mg total) by mouth daily. 07/09/17   Ladell Pier, MD  diclofenac sodium (VOLTAREN) 1 % GEL Apply 4 g topically 4 (four) times daily. 10/08/17  Ladell Pier, MD  HYDROcodone-acetaminophen (NORCO/VICODIN) 5-325 MG tablet Take 1 tablet by mouth every 6 (six) hours as needed for severe pain. 08/31/21   Truddie Hidden, MD  ondansetron (ZOFRAN ODT) 8 MG disintegrating tablet '8mg'$  ODT q4 hours prn nausea Patient not taking: Reported on 07/09/2017 06/20/17   Veryl Speak, MD    Physical Exam: Vitals:   12/20/21 1145 12/20/21 1245 12/20/21 1330 12/20/21 1415  BP: 122/67 (!) 155/81 140/77  (!) 149/82  Pulse: 67 71 65 63  Resp: '15 15 15 15  '$ Temp:      TempSrc:      SpO2: 99% 100% 99% 100%  Exam  Constitutional: Middle-aged female currently in no acute distress Eyes: PERRL, lids and conjunctivae normal ENMT: Mucous membranes are moist. Posterior pharynx clear of any exudate or lesions.   Neck: normal, supple, no masses, no thyromegaly Respiratory: clear to auscultation bilaterally, no wheezing, no crackles. Normal respiratory effort.   Cardiovascular: Regular rate and rhythm, no murmurs / rubs / gallops. No extremity edema.   Abdomen: Mild lower abdominal tenderness to palpation.  Bowel sounds appreciated. Musculoskeletal: no clubbing / cyanosis. No joint deformity upper and lower extremities. Good ROM, no contractures. Normal muscle tone.  Skin: no rashes, lesions, ulcers. No induration Neurologic: CN 2-12 grossly intact. Sensation intact, DTR normal. Strength 5/5 in all 4.  Psychiatric: Normal judgment and insight. Alert and oriented x 3. Normal mood.   Data Reviewed:  Reviewed labs, imaging, and pertinent records as noted above in HPI.  Assessment and Plan: Diverticulitis Acute. Patient presents with complaints of abdominal pain.  CT scan of the abdomen and pelvis noted mild sigmoid diverticulitis increased from prior study in February without signs of perforation or abscess.  She was started on empiric antibiotics of ciprofloxacin and metronidazole.  Prior episodes of diverticulitis in February of this year and back in 2018 in the same area of the sigmoid colon. -Admit to medical telemetry bed -Clear liquid diet and advance as tolerated -Antibiotics changed to Zosyn -Hydrocodone as needed for pain -Counseled patient on need to follow-up with gastroenterology in outpatient setting for colonoscopy  Leukocytosis Acute.  WBC elevated 10.7.  Suspect secondary to above. -Check CBC tomorrow morning.  Hypokalemia Acute.  Initial potassium 3.  Patient has been given  potassium chloride 40 mEq p.o. -Given additional 20 mEq IV with 1 L normal saline IV fluids at 100 mL/h  Lacks PCP lacks insurance -Transitions of care consult  Tobacco use Reports intermittently smoking cigarettes here and there and not on a regular basis. -Counseled patient on the need of cessation of tobacco use altogether  Obesity BMI 36.01 kg/m  Advance Care Planning:   Code Status: Full Code   Consults: None  Family Communication: Mother update  Severity of Illness: The appropriate patient status for this patient is OBSERVATION. Observation status is judged to be reasonable and necessary in order to provide the required intensity of service to ensure the patient's safety. The patient's presenting symptoms, physical exam findings, and initial radiographic and laboratory data in the context of their medical condition is felt to place them at decreased risk for further clinical deterioration. Furthermore, it is anticipated that the patient will be medically stable for discharge from the hospital within 2 midnights of admission.   Author: Norval Morton, MD 12/20/2021 2:49 PM  For on call review [email or web address].

## 2021-12-20 NOTE — ED Provider Notes (Signed)
[facility] EMERGENCY DEPARTMENT Provider Note   CSN: [id number] Arrival date & time: 12/20/21  1017     History {Add pertinent medical, surgical, social history, OB history to HPI:1} Chief Complaint  Patient presents with   Abdominal Pain    Christina Cannon is a 56 y.o. female.  Patient with a history of diverticulitis.  She complains of significant pain in her lower abdomen.  She was admitted with diverticulitis approximately 4 years ago and then had it again 3 months ago and was treated as an outpatient   Abdominal Pain     Home Medications Prior to Admission medications   Medication Sig Start Date End Date Taking? Authorizing Provider  acetaminophen (TYLENOL) 500 MG tablet Take 500 mg every 6 (six) hours as needed by mouth (for pain).    [provider]  amoxicillin-clavulanate (AUGMENTIN) 875-125 MG tablet Take 1 tablet by mouth every 12 (twelve) hours. 08/31/21   Truddie Hidden, MD  aspirin 325 MG EC tablet Take 325 mg every 6 (six) hours as needed by mouth for pain.     [provider]  celecoxib (CELEBREX) 200 MG capsule Take 1 capsule (200 mg total) by mouth daily. 07/09/17   Ladell Pier, MD  diclofenac sodium (VOLTAREN) 1 % GEL Apply 4 g topically 4 (four) times daily. 10/08/17   Ladell Pier, MD  HYDROcodone-acetaminophen (NORCO/VICODIN) 5-325 MG tablet Take 1 tablet by mouth every 6 (six) hours as needed for severe pain. 08/31/21   Truddie Hidden, MD  ondansetron (ZOFRAN ODT) 8 MG disintegrating tablet '8mg'$  ODT q4 hours prn nausea Patient not taking: Reported on 07/09/2017 06/20/17   Veryl Speak, MD      Allergies    Ceftin [cefuroxime axetil]    Review of Systems   Review of Systems  Gastrointestinal:  Positive for abdominal pain.   Physical Exam Updated Vital Signs BP (!) 149/82   Pulse 63   Temp 98.1 F (36.7 C) (Oral)   Resp 15   SpO2 100%  Physical Exam  ED Results / Procedures /  Treatments   Labs (all labs ordered are listed, but only abnormal results are displayed) Labs Reviewed  CBC WITH DIFFERENTIAL/PLATELET - Abnormal; Notable for the following components:      Result Value   WBC 10.7 (*)    Neutro Abs 7.9 (*)    All other components within normal limits  COMPREHENSIVE METABOLIC PANEL - Abnormal; Notable for the following components:   Potassium 3.0 (*)    Glucose, Bld 114 (*)    Calcium 8.8 (*)    Albumin 3.4 (*)    AST 14 (*)    All other components within normal limits  URINALYSIS, ROUTINE W REFLEX MICROSCOPIC - Abnormal; Notable for the following components:   APPearance HAZY (*)    Hgb urine dipstick MODERATE (*)    All other components within normal limits  HIV ANTIBODY (ROUTINE TESTING W REFLEX)    EKG None  Radiology No results found.  Procedures Procedures  {Document cardiac monitor, telemetry assessment procedure when appropriate:1}  Medications Ordered in ED Medications  metroNIDAZOLE (FLAGYL) IVPB 500 mg (500 mg Intravenous New Bag/Given 12/20/21 1451)  ciprofloxacin (CIPRO) IVPB 400 mg (has no administration in time range)  metroNIDAZOLE (FLAGYL) IVPB 500 mg (has no administration in time range)  enoxaparin (LOVENOX) injection 40 mg (has no administration in time range)  sodium chloride flush (NS) 0.9 % injection 3 mL (has no administration in  time range)  acetaminophen (TYLENOL) tablet 650 mg (has no administration in time range)    Or  acetaminophen (TYLENOL) suppository 650 mg (has no administration in time range)  sodium chloride 0.9 % 1,000 mL with potassium chloride 40 mEq infusion (has no administration in time range)  ondansetron (ZOFRAN) tablet 4 mg (has no administration in time range)    Or  ondansetron (ZOFRAN) injection 4 mg (has no administration in time range)  albuterol (PROVENTIL) (2.5 MG/3ML) 0.083% nebulizer solution 2.5 mg (has no administration in time range)  HYDROmorphone (DILAUDID) injection 0.5 mg (0.5  mg Intravenous Given 12/20/21 1055)  ondansetron (ZOFRAN) injection 4 mg (4 mg Intravenous Given 12/20/21 1056)  sodium chloride 0.9 % bolus 1,000 mL (0 mLs Intravenous Stopped 12/20/21 1202)  iohexol (OMNIPAQUE) 300 MG/ML solution 100 mL (100 mLs Intravenous Contrast Given 12/20/21 1159)  potassium chloride SA (KLOR-CON M) CR tablet 40 mEq (40 mEq Oral Given 12/20/21 1452)  ondansetron (ZOFRAN) injection 4 mg (4 mg Intravenous Given 12/20/21 1451)  HYDROmorphone (DILAUDID) injection 0.5 mg (0.5 mg Intravenous Given 12/20/21 1452)    ED Course/ Medical Decision Making/ A&P                           Medical Decision Making Amount and/or Complexity of Data Reviewed Labs: ordered. Radiology: ordered.  Risk Prescription drug management. Decision regarding hospitalization.   Patient with diverticulitis and significant pain and nausea.  She will be admitted for IV antibiotics  {Document critical care time when appropriate:1} {Document review of labs and clinical decision tools ie heart score, Chads2Vasc2 etc:1}  {Document your independent review of radiology images, and any outside records:1} {Document your discussion with family members, caretakers, and with consultants:1} {Document social determinants of health affecting pt's care:1} {Document your decision making why or why not admission, treatments were needed:1} Final Clinical Impression(s) / ED Diagnoses Final diagnoses:  Diverticulitis    Rx / DC Orders ED Discharge Orders     None

## 2021-12-21 ENCOUNTER — Encounter (HOSPITAL_COMMUNITY): Payer: Self-pay | Admitting: Internal Medicine

## 2021-12-21 ENCOUNTER — Other Ambulatory Visit: Payer: Self-pay

## 2021-12-21 DIAGNOSIS — K529 Noninfective gastroenteritis and colitis, unspecified: Secondary | ICD-10-CM | POA: Diagnosis present

## 2021-12-21 DIAGNOSIS — Z6836 Body mass index (BMI) 36.0-36.9, adult: Secondary | ICD-10-CM | POA: Diagnosis not present

## 2021-12-21 DIAGNOSIS — R9431 Abnormal electrocardiogram [ECG] [EKG]: Secondary | ICD-10-CM | POA: Diagnosis present

## 2021-12-21 DIAGNOSIS — Z888 Allergy status to other drugs, medicaments and biological substances status: Secondary | ICD-10-CM | POA: Diagnosis not present

## 2021-12-21 DIAGNOSIS — K5792 Diverticulitis of intestine, part unspecified, without perforation or abscess without bleeding: Secondary | ICD-10-CM | POA: Diagnosis not present

## 2021-12-21 DIAGNOSIS — D72829 Elevated white blood cell count, unspecified: Secondary | ICD-10-CM | POA: Diagnosis not present

## 2021-12-21 DIAGNOSIS — Z833 Family history of diabetes mellitus: Secondary | ICD-10-CM | POA: Diagnosis not present

## 2021-12-21 DIAGNOSIS — E669 Obesity, unspecified: Secondary | ICD-10-CM | POA: Diagnosis present

## 2021-12-21 DIAGNOSIS — Z8249 Family history of ischemic heart disease and other diseases of the circulatory system: Secondary | ICD-10-CM | POA: Diagnosis not present

## 2021-12-21 DIAGNOSIS — F17210 Nicotine dependence, cigarettes, uncomplicated: Secondary | ICD-10-CM | POA: Diagnosis present

## 2021-12-21 DIAGNOSIS — K5732 Diverticulitis of large intestine without perforation or abscess without bleeding: Secondary | ICD-10-CM | POA: Diagnosis present

## 2021-12-21 DIAGNOSIS — E876 Hypokalemia: Secondary | ICD-10-CM | POA: Diagnosis present

## 2021-12-21 LAB — RENAL FUNCTION PANEL
Albumin: 3.2 g/dL — ABNORMAL LOW (ref 3.5–5.0)
Anion gap: 7 (ref 5–15)
BUN: 7 mg/dL (ref 6–20)
CO2: 28 mmol/L (ref 22–32)
Calcium: 8.3 mg/dL — ABNORMAL LOW (ref 8.9–10.3)
Chloride: 104 mmol/L (ref 98–111)
Creatinine, Ser: 1.03 mg/dL — ABNORMAL HIGH (ref 0.44–1.00)
GFR, Estimated: >60 mL/min (ref >60)
Glucose, Bld: 144 mg/dL — ABNORMAL HIGH (ref 70–99)
Phosphorus: 2.2 mg/dL — ABNORMAL LOW (ref 2.5–4.6)
Potassium: 2.7 mmol/L — CL (ref 3.5–5.1)
Sodium: 139 mmol/L (ref 135–145)

## 2021-12-21 LAB — BASIC METABOLIC PANEL
Anion gap: 7 (ref 5–15)
BUN: 7 mg/dL (ref 6–20)
CO2: 30 mmol/L (ref 22–32)
Calcium: 8.3 mg/dL — ABNORMAL LOW (ref 8.9–10.3)
Chloride: 105 mmol/L (ref 98–111)
Creatinine, Ser: 0.99 mg/dL (ref 0.44–1.00)
GFR, Estimated: >60 mL/min (ref >60)
Glucose, Bld: 98 mg/dL (ref 70–99)
Potassium: 2.8 mmol/L — ABNORMAL LOW (ref 3.5–5.1)
Sodium: 142 mmol/L (ref 135–145)

## 2021-12-21 LAB — CBC
HCT: 39.2 % (ref 36.0–46.0)
Hemoglobin: 13.8 g/dL (ref 12.0–15.0)
MCH: 29.2 pg (ref 26.0–34.0)
MCHC: 35.2 g/dL (ref 30.0–36.0)
MCV: 83.1 fL (ref 80.0–100.0)
Platelets: 206 10*3/uL (ref 150–400)
RBC: 4.72 MIL/uL (ref 3.87–5.11)
RDW: 12.9 % (ref 11.5–15.5)
WBC: 8.2 10*3/uL (ref 4.0–10.5)
nRBC: 0 % (ref 0.0–0.2)

## 2021-12-21 LAB — MAGNESIUM
Magnesium: 1.9 mg/dL (ref 1.7–2.4)
Magnesium: 2.2 mg/dL (ref 1.7–2.4)

## 2021-12-21 LAB — HIV ANTIBODY (ROUTINE TESTING W REFLEX): HIV Screen 4th Generation wRfx: NONREACTIVE

## 2021-12-21 MED ORDER — MAGNESIUM SULFATE IN D5W 1-5 GM/100ML-% IV SOLN
1.0000 g | Freq: Once | INTRAVENOUS | Status: AC
  Administered 2021-12-21: 1 g via INTRAVENOUS
  Filled 2021-12-21: qty 100

## 2021-12-21 MED ORDER — PROCHLORPERAZINE EDISYLATE 10 MG/2ML IJ SOLN
10.0000 mg | Freq: Once | INTRAMUSCULAR | Status: AC | PRN
  Administered 2021-12-21: 10 mg via INTRAVENOUS
  Filled 2021-12-21: qty 2

## 2021-12-21 MED ORDER — POTASSIUM CHLORIDE 10 MEQ/100ML IV SOLN
10.0000 meq | INTRAVENOUS | Status: AC
  Administered 2021-12-21 (×3): 10 meq via INTRAVENOUS
  Filled 2021-12-21 (×3): qty 100

## 2021-12-21 MED ORDER — POTASSIUM CHLORIDE CRYS ER 20 MEQ PO TBCR
40.0000 meq | EXTENDED_RELEASE_TABLET | Freq: Once | ORAL | Status: AC
Start: 2021-12-21 — End: 2021-12-21
  Administered 2021-12-21: 40 meq via ORAL
  Filled 2021-12-21: qty 2

## 2021-12-21 MED ORDER — POTASSIUM CHLORIDE IN NACL 20-0.9 MEQ/L-% IV SOLN
INTRAVENOUS | Status: DC
  Filled 2021-12-21 (×4): qty 1000

## 2021-12-21 MED ORDER — POTASSIUM CHLORIDE CRYS ER 20 MEQ PO TBCR
40.0000 meq | EXTENDED_RELEASE_TABLET | ORAL | Status: AC
  Administered 2021-12-21 (×2): 40 meq via ORAL
  Filled 2021-12-21 (×2): qty 2

## 2021-12-21 MED ORDER — POTASSIUM CHLORIDE 10 MEQ/100ML IV SOLN
10.0000 meq | INTRAVENOUS | Status: AC
  Administered 2021-12-21 (×2): 10 meq via INTRAVENOUS
  Filled 2021-12-21: qty 100

## 2021-12-21 NOTE — Plan of Care (Signed)
  Problem: Education: Goal: Knowledge of General Education information will improve Description: Including pain rating scale, medication(s)/side effects and non-pharmacologic comfort measures Outcome: Progressing   Problem: Health Behavior/Discharge Planning: Goal: Ability to manage health-related needs will improve Outcome: Progressing   Problem: Clinical Measurements: Goal: Ability to maintain clinical measurements within normal limits will improve Outcome: Progressing Goal: Will remain free from infection Outcome: Progressing Goal: Diagnostic test results will improve Outcome: Progressing Goal: Respiratory complications will improve Outcome: Progressing Goal: Cardiovascular complication will be avoided Outcome: Progressing   Problem: Activity: Goal: Risk for activity intolerance will decrease Outcome: Progressing   Problem: Nutrition: Goal: Adequate nutrition will be maintained Outcome: Progressing   Problem: Coping: Goal: Level of anxiety will decrease Outcome: Progressing   Problem: Elimination: Goal: Will not experience complications related to bowel motility Outcome: Progressing Goal: Will not experience complications related to urinary retention Outcome: Progressing   Problem: Pain Managment: Goal: General experience of comfort will improve Outcome: Progressing   Problem: Safety: Goal: Ability to remain free from injury will improve Outcome: Progressing   Problem: Skin Integrity: Goal: Risk for impaired skin integrity will decrease Outcome: Progressing   Problem: Education: Goal: Ability to demonstrate management of disease process will improve Outcome: Progressing Goal: Ability to verbalize understanding of medication therapies will improve Outcome: Progressing Goal: Individualized Educational Video(s) Outcome: Progressing   Problem: Activity: Goal: Capacity to carry out activities will improve Outcome: Progressing   Problem: Cardiac: Goal:  Ability to achieve and maintain adequate cardiopulmonary perfusion will improve Outcome: Progressing   Problem: Education: Goal: Knowledge of disease or condition will improve Outcome: Progressing Goal: Understanding of medication regimen will improve Outcome: Progressing Goal: Individualized Educational Video(s) Outcome: Progressing   Problem: Activity: Goal: Ability to tolerate increased activity will improve Outcome: Progressing   Problem: Cardiac: Goal: Ability to achieve and maintain adequate cardiopulmonary perfusion will improve Outcome: Progressing   Problem: Health Behavior/Discharge Planning: Goal: Ability to safely manage health-related needs after discharge will improve Outcome: Progressing   

## 2021-12-21 NOTE — Care Management (Signed)
  Transition of Care [facility]) Screening Note   Patient Details  Name: Christina Cannon Date of Birth: 19-Dec-1965   Transition of Care [facility]) CM/SW Contact:    Bethena Roys, RN Phone Number: 12/21/2021, 1:00 PM    Transition of Care Department [facility]) has reviewed the patient. Case Manager received a consult for PCP needs and lacks insurance. Patient has [employer]. Patient is agreeable for Case Manager to call the [facility] for PCP needs. Patient has seen Dr. Wynetta Emery in the past. Case Manager did call the clinic and the appointment has been scheduled and will be placed on the AVS. If the patient has new medications please fill via [facility]. No further needs identified at this time.

## 2021-12-21 NOTE — Progress Notes (Signed)
Direct PROGRESS NOTE    Christina Cannon  WNU:[id number] DOB: 1966-07-14 DOA: 12/20/2021 PCP: Ladell Pier, MD  Outpatient Specialists:    Brief Narrative:  Patient is a 56 year old female past medical history significant for diverticulitis.  Patient was admitted with 2-week history of lower abdominal pain.  CT scan of the abdomen and pelvis done revealed sigmoid diverticulitis.  Patient is currently on IV Zosyn.  Significant electrolyte abnormalities noted.  Patient has severe hypokalemia.   Assessment & Plan:   Principal Problem:   Diverticulitis Active Problems:   Leukocytosis   Hypokalemia   Tobacco use   Obesity (BMI 30-39.9)   Prolonged QT interval   Diverticulitis Acute. Patient presents with complaints of abdominal pain.  CT scan of the abdomen and pelvis noted mild sigmoid diverticulitis increased from prior study in February without signs of perforation or abscess.  She was started on empiric antibiotics of ciprofloxacin and metronidazole.  Prior episodes of diverticulitis in February of this year and back in 2018 in the same area of the sigmoid colon. -Admit to medical telemetry bed -Clear liquid diet and advance as tolerated -Antibiotics changed to Zosyn -Hydrocodone as needed for pain -Counseled patient on need to follow-up with gastroenterology in outpatient setting for colonoscopy Leukocytosis currently resolved continue IV Zosyn.  Symptoms are improving.  Likely discharge home tomorrow after hypokalemia resolves.   Leukocytosis Acute.  WBC elevated 10.7.  Suspect secondary to above. -Check CBC tomorrow morning. 12/21/2021: Leukocytosis has resolved.   Hypokalemia Acute.  Initial potassium 3.  Patient has been given potassium chloride 40 mEq p.o. -Given additional 20 mEq IV with 1 L normal saline IV fluids at 100 mL/h 12/21/2021: Potassium is 2.7.  Monitor closely and replete.  Monitor magnesium level.  Last magnesium was 2.2.   Lacks PCP lacks  insurance -Transitions of care consult   Tobacco use Reports intermittently smoking cigarettes here and there and not on a regular basis. -Counseled patient on the need of cessation of tobacco use altogether   Obesity BMI 36.01 kg/m   DVT prophylaxis: [place] Lovenox Code Status: Full code Family Communication:  Disposition Plan: Home   Consultants:  None  Procedures:  None  Antimicrobials:  I.V. Zosyn    Subjective: No abdominal pain. No nausea or vomiting. No fever or chills.  Objective: Vitals:   12/20/21 2022 12/21/21 0023 12/21/21 0441 12/21/21 1526  BP: (!) 158/64 (!) 141/77 (!) 153/89 126/73  Pulse: 66 65 69 70  Resp: '16 16 16 16  '$ Temp: 97.7 F (36.5 C) 98.3 F (36.8 C) (!) 96.7 F (35.9 C) 97.8 F (36.6 C)  TempSrc: Axillary Oral Axillary Oral  SpO2:    100%  Weight:      Height:        Intake/Output Summary (Last 24 hours) at 12/21/2021 1604 Last data filed at 12/21/2021 0428 Gross per 24 hour  Intake 2214.35 ml  Output 850 ml  Net 1364.35 ml   Filed Weights   12/20/21 1640  Weight: 104.3 kg    Examination:  General exam: Appears calm and comfortable  Respiratory system: Clear to auscultation.   Cardiovascular system: S1 & S2 heard,  Gastrointestinal system: Abdomen is soft and nontender.  Central nervous system: Alert and oriented. No focal neurological deficits. Extremities: No leg edema   Data Reviewed: I have personally reviewed following labs and imaging studies  CBC: Recent Labs  Lab 12/20/21 1048 12/21/21 0429  WBC 10.7* 8.2  NEUTROABS 7.9*  --  HGB 14.3 13.8  HCT 42.7 39.2  MCV 84.6 83.1  PLT 219 373   Basic Metabolic Panel: Recent Labs  Lab 12/20/21 1048 12/21/21 0429 12/21/21 1515  NA 138 142 139  K 3.0* 2.8* 2.7*  CL 105 105 104  CO2 '27 30 28  '$ GLUCOSE 114* 98 144*  BUN '7 7 7  '$ CREATININE 0.91 0.99 1.03*  CALCIUM 8.8* 8.3* 8.3*  MG  --  1.9 2.2  PHOS  --   --  2.2*   GFR: Estimated Creatinine Clearance:  74.9 mL/min (A) (by C-G formula based on SCr of 1.03 mg/dL (H)). Liver Function Tests: Recent Labs  Lab 12/20/21 1048 12/21/21 1515  AST 14*  --   ALT 12  --   ALKPHOS 64  --   BILITOT 0.7  --   PROT 7.1  --   ALBUMIN 3.4* 3.2*   No results for input(s): LIPASE, AMYLASE in the last 168 hours. No results for input(s): AMMONIA in the last 168 hours. Coagulation Profile: No results for input(s): INR, PROTIME in the last 168 hours. Cardiac Enzymes: No results for input(s): CKTOTAL, CKMB, CKMBINDEX, TROPONINI in the last 168 hours. BNP (last 3 results) No results for input(s): PROBNP in the last 8760 hours. HbA1C: No results for input(s): HGBA1C in the last 72 hours. CBG: No results for input(s): GLUCAP in the last 168 hours. Lipid Profile: No results for input(s): CHOL, HDL, LDLCALC, TRIG, CHOLHDL, LDLDIRECT in the last 72 hours. Thyroid Function Tests: No results for input(s): TSH, T4TOTAL, FREET4, T3FREE, THYROIDAB in the last 72 hours. Anemia Panel: No results for input(s): VITAMINB12, FOLATE, FERRITIN, TIBC, IRON, RETICCTPCT in the last 72 hours. Urine analysis:    Component Value Date/Time   COLORURINE YELLOW 12/20/2021 1245   APPEARANCEUR HAZY (A) 12/20/2021 1245   LABSPEC 1.019 12/20/2021 1245   PHURINE 7.0 12/20/2021 1245   GLUCOSEU NEGATIVE 12/20/2021 1245   HGBUR MODERATE (A) 12/20/2021 1245   BILIRUBINUR NEGATIVE 12/20/2021 1245   KETONESUR NEGATIVE 12/20/2021 1245   PROTEINUR NEGATIVE 12/20/2021 1245   UROBILINOGEN 0.2 12/20/2021 0927   NITRITE NEGATIVE 12/20/2021 1245   LEUKOCYTESUR NEGATIVE 12/20/2021 1245   Sepsis Labs: '@LABRCNTIP'$ (procalcitonin:4,lacticidven:4)  )No results found for this or any previous visit (from the past 240 hour(s)).       Radiology Studies: CT ABDOMEN PELVIS W CONTRAST  Result Date: 12/20/2021 CLINICAL DATA:  Lower abdominal pain for 1 week. Nausea and vomiting. Recent sigmoid diverticulitis. EXAM: CT ABDOMEN AND PELVIS WITH  CONTRAST TECHNIQUE: Multidetector CT imaging of the abdomen and pelvis was performed using the standard protocol following bolus administration of intravenous contrast. RADIATION DOSE REDUCTION: This exam was performed according to the departmental dose-optimization program which includes automated exposure control, adjustment of the mA and/or kV according to patient size and/or use of iterative reconstruction technique. CONTRAST:  185m OMNIPAQUE IOHEXOL 300 MG/ML  SOLN COMPARISON:  08/31/2021 FINDINGS: Lower Chest: No acute findings. Hepatobiliary: No hepatic masses identified. Mild-to-moderate diffuse hepatic steatosis again seen. Prior cholecystectomy. No evidence of biliary obstruction. Pancreas:  No mass or inflammatory changes. Spleen: Within normal limits in size and appearance. Adrenals/Urinary Tract: No masses identified. No evidence of ureteral calculi or hydronephrosis. Stomach/Bowel: Mild diverticulitis is again seen involving the sigmoid colon, minimally increased since previous study. No evidence of perforation or abscess. Vascular/Lymphatic: No pathologically enlarged lymph nodes. No acute vascular findings. Aortic atherosclerotic calcification incidentally noted. Reproductive: Small uterine fibroids again noted, largest measuring 3 cm. Adnexal regions are unremarkable. Other:  None. Musculoskeletal:  No suspicious bone lesions identified. IMPRESSION: Mild sigmoid diverticulitis, minimally increased since previous study. No evidence of perforation or abscess. Stable small uterine fibroids. Stable hepatic steatosis. Aortic Atherosclerosis (ICD10-I70.0). Electronically Signed   By: Marlaine Hind M.D.   On: 12/20/2021 12:47        Scheduled Meds:  enoxaparin (LOVENOX) injection  40 mg Subcutaneous Q24H   potassium chloride  40 mEq Oral Q4H   sodium chloride flush  3 mL Intravenous Q12H   Continuous Infusions:  piperacillin-tazobactam (ZOSYN)  IV 3.375 g (12/21/21 1435)   potassium chloride        LOS: 0 days    Time spent: 35 Minutes.    Dana Allan, MD  [facility] Pager #: [phone] 7PM-7AM contact night coverage as above

## 2021-12-22 LAB — RENAL FUNCTION PANEL
Albumin: 3 g/dL — ABNORMAL LOW (ref 3.5–5.0)
Anion gap: 5 (ref 5–15)
BUN: 10 mg/dL (ref 6–20)
CO2: 27 mmol/L (ref 22–32)
Calcium: 8.5 mg/dL — ABNORMAL LOW (ref 8.9–10.3)
Chloride: 108 mmol/L (ref 98–111)
Creatinine, Ser: 1.01 mg/dL — ABNORMAL HIGH (ref 0.44–1.00)
GFR, Estimated: >60 mL/min (ref >60)
Glucose, Bld: 105 mg/dL — ABNORMAL HIGH (ref 70–99)
Phosphorus: 3.4 mg/dL (ref 2.5–4.6)
Potassium: 3.4 mmol/L — ABNORMAL LOW (ref 3.5–5.1)
Sodium: 140 mmol/L (ref 135–145)

## 2021-12-22 LAB — MAGNESIUM: Magnesium: 2.2 mg/dL (ref 1.7–2.4)

## 2021-12-22 MED ORDER — POTASSIUM CHLORIDE CRYS ER 20 MEQ PO TBCR
40.0000 meq | EXTENDED_RELEASE_TABLET | Freq: Once | ORAL | Status: AC
  Administered 2021-12-22: 40 meq via ORAL
  Filled 2021-12-22: qty 2

## 2021-12-22 NOTE — Progress Notes (Signed)
Direct PROGRESS NOTE    Christina Cannon  AYT:[id number] DOB: 10-13-1965 DOA: 12/20/2021 PCP: Ladell Pier, MD  Outpatient Specialists:    Brief Narrative:  Patient is a 56 year old female past medical history significant for diverticulitis.  Patient was admitted with 2-week history of lower abdominal pain.  CT scan of the abdomen and pelvis done revealed sigmoid diverticulitis.  Patient is currently on IV Zosyn.  Significant electrolyte abnormalities noted.  Patient has severe potassium today is 3.4.  12/22/2021: Patient has been having significant diarrhea.  Hypokalemia is likely secondary to significant diarrhea.  Patient reports mild abdominal pain.  We will continue to monitor electrolytes and replete for now.  Magnesium is normal.  Continue IV antibiotics for now.  Assessment & Plan:   Principal Problem:   Diverticulitis Active Problems:   Leukocytosis   Hypokalemia   Tobacco use   Obesity (BMI 30-39.9)   Prolonged QT interval   Colitis   Diverticulitis Acute. Patient presents with complaints of abdominal pain.  CT scan of the abdomen and pelvis noted mild sigmoid diverticulitis increased from prior study in February without signs of perforation or abscess.  She was started on empiric antibiotics of ciprofloxacin and metronidazole.  Prior episodes of diverticulitis in February of this year and back in 2018 in the same area of the sigmoid colon. -Admit to medical telemetry bed -Clear liquid diet and advance as tolerated -Antibiotics changed to Zosyn -Hydrocodone as needed for pain -Counseled patient on need to follow-up with gastroenterology in outpatient setting for colonoscopy Leukocytosis currently resolved continue IV Zosyn. 12/22/2021: Patient continues to have mild abdominal pain and diarrhea.  Diarrhea is significant.  Continue IV Zosyn for now.  Continue to correct hypokalemia.  Potassium today is 3.4.     Leukocytosis Acute.  WBC elevated 10.7.  Suspect  secondary to above. -Check CBC tomorrow morning. 12/22/2021: Leukocytosis has resolved.   Hypokalemia Acute.  Initial potassium 3.  Patient has been given potassium chloride 40 mEq p.o. -Given additional 20 mEq IV with 1 L normal saline IV fluids at 100 mL/h 12/21/2021: Potassium is 2.7.  Monitor closely and replete.  Monitor magnesium level.  Last magnesium was 2.2. 12/22/2021: Hypokalemia secondary to diarrhea.  Potassium today is 3.4.  We will continue to monitor and replete potassium.   Lacks PCP lacks insurance -Transitions of care consult   Tobacco use Reports intermittently smoking cigarettes here and there and not on a regular basis. -Counseled patient on the need of cessation of tobacco use altogether   Obesity BMI 36.01 kg/m   DVT prophylaxis: [place] Lovenox Code Status: Full code Family Communication:  Disposition Plan: Home   Consultants:  None  Procedures:  None  Antimicrobials:  I.V. Zosyn    Subjective: Mild abdominal pain. Significant watery diarrhea No nausea or vomiting. No fever or chills.  Objective: Vitals:   12/21/21 1526 12/21/21 2022 12/22/21 0336 12/22/21 1223  BP: 126/73 (!) 150/85 131/85 (!) 164/86  Pulse: 70 63 65   Resp: 16   20  Temp: 97.8 F (36.6 C) 99 F (37.2 C) 98.9 F (37.2 C) 98.6 F (37 C)  TempSrc: Oral Oral Oral Oral  SpO2: 100% 100% 99% 99%  Weight:      Height:        Intake/Output Summary (Last 24 hours) at 12/22/2021 1915 Last data filed at 12/22/2021 1603 Gross per 24 hour  Intake 1911.96 ml  Output --  Net 1911.96 ml    [employer]  12/20/21 1640  Weight: 104.3 kg    Examination:  General exam: Appears calm and comfortable  Respiratory system: Clear to auscultation.   Cardiovascular system: S1 & S2 heard,  Gastrointestinal system: Abdomen is soft and nontender.  Central nervous system: Alert and oriented. No focal neurological deficits. Extremities: No leg edema   Data Reviewed: I have personally  reviewed following labs and imaging studies  CBC: Recent Labs  Lab 12/20/21 1048 12/21/21 0429  WBC 10.7* 8.2  NEUTROABS 7.9*  --   HGB 14.3 13.8  HCT 42.7 39.2  MCV 84.6 83.1  PLT 219 154    Basic Metabolic Panel: Recent Labs  Lab 12/20/21 1048 12/21/21 0429 12/21/21 1515 12/22/21 0205  NA 138 142 139 140  K 3.0* 2.8* 2.7* 3.4*  CL 105 105 104 108  CO2 '27 30 28 27  '$ GLUCOSE 114* 98 144* 105*  BUN '7 7 7 10  '$ CREATININE 0.91 0.99 1.03* 1.01*  CALCIUM 8.8* 8.3* 8.3* 8.5*  MG  --  1.9 2.2 2.2  PHOS  --   --  2.2* 3.4    GFR: Estimated Creatinine Clearance: 76.4 mL/min (A) (by C-G formula based on SCr of 1.01 mg/dL (H)). Liver Function Tests: Recent Labs  Lab 12/20/21 1048 12/21/21 1515 12/22/21 0205  AST 14*  --   --   ALT 12  --   --   ALKPHOS 64  --   --   BILITOT 0.7  --   --   PROT 7.1  --   --   ALBUMIN 3.4* 3.2* 3.0*    No results for input(s): LIPASE, AMYLASE in the last 168 hours. No results for input(s): AMMONIA in the last 168 hours. Coagulation Profile: No results for input(s): INR, PROTIME in the last 168 hours. Cardiac Enzymes: No results for input(s): CKTOTAL, CKMB, CKMBINDEX, TROPONINI in the last 168 hours. BNP (last 3 results) No results for input(s): PROBNP in the last 8760 hours. HbA1C: No results for input(s): HGBA1C in the last 72 hours. CBG: No results for input(s): GLUCAP in the last 168 hours. Lipid Profile: No results for input(s): CHOL, HDL, LDLCALC, TRIG, CHOLHDL, LDLDIRECT in the last 72 hours. Thyroid Function Tests: No results for input(s): TSH, T4TOTAL, FREET4, T3FREE, THYROIDAB in the last 72 hours. Anemia Panel: No results for input(s): VITAMINB12, FOLATE, FERRITIN, TIBC, IRON, RETICCTPCT in the last 72 hours. Urine analysis:    Component Value Date/Time   COLORURINE YELLOW 12/20/2021 1245   APPEARANCEUR HAZY (A) 12/20/2021 1245   LABSPEC 1.019 12/20/2021 1245   PHURINE 7.0 12/20/2021 1245   GLUCOSEU NEGATIVE  12/20/2021 1245   HGBUR MODERATE (A) 12/20/2021 1245   BILIRUBINUR NEGATIVE 12/20/2021 1245   KETONESUR NEGATIVE 12/20/2021 1245   PROTEINUR NEGATIVE 12/20/2021 1245   UROBILINOGEN 0.2 12/20/2021 0927   NITRITE NEGATIVE 12/20/2021 1245   LEUKOCYTESUR NEGATIVE 12/20/2021 1245   Sepsis Labs: '@LABRCNTIP'$ (procalcitonin:4,lacticidven:4)  )No results found for this or any previous visit (from the past 240 hour(s)).       Radiology Studies: No results found.      Scheduled Meds:  enoxaparin (LOVENOX) injection  40 mg Subcutaneous Q24H   sodium chloride flush  3 mL Intravenous Q12H   Continuous Infusions:  0.9 % NaCl with KCl 20 mEq / L 50 mL/hr at 12/22/21 1724   piperacillin-tazobactam (ZOSYN)  IV 3.375 g (12/22/21 1554)     LOS: 1 day    Time spent: 35 Minutes.    Dana Allan, MD  [facility] Pager #: [phone] 7PM-7AM contact night coverage as above

## 2021-12-22 NOTE — Plan of Care (Signed)
  Problem: Education: Goal: Knowledge of General Education information will improve Description: Including pain rating scale, medication(s)/side effects and non-pharmacologic comfort measures Outcome: Adequate for Discharge   Problem: Health Behavior/Discharge Planning: Goal: Ability to manage health-related needs will improve Outcome: Adequate for Discharge   Problem: Clinical Measurements: Goal: Ability to maintain clinical measurements within normal limits will improve Outcome: Adequate for Discharge Goal: Will remain free from infection Outcome: Adequate for Discharge Goal: Diagnostic test results will improve Outcome: Adequate for Discharge Goal: Respiratory complications will improve Outcome: Adequate for Discharge Goal: Cardiovascular complication will be avoided Outcome: Adequate for Discharge   Problem: Activity: Goal: Risk for activity intolerance will decrease Outcome: Adequate for Discharge   Problem: Nutrition: Goal: Adequate nutrition will be maintained Outcome: Adequate for Discharge   Problem: Coping: Goal: Level of anxiety will decrease Outcome: Adequate for Discharge   Problem: Elimination: Goal: Will not experience complications related to bowel motility Outcome: Adequate for Discharge Goal: Will not experience complications related to urinary retention Outcome: Adequate for Discharge   Problem: Pain Managment: Goal: General experience of comfort will improve Outcome: Adequate for Discharge   Problem: Safety: Goal: Ability to remain free from injury will improve Outcome: Adequate for Discharge   Problem: Skin Integrity: Goal: Risk for impaired skin integrity will decrease Outcome: Adequate for Discharge   Problem: Education: Goal: Ability to demonstrate management of disease process will improve Outcome: Adequate for Discharge Goal: Ability to verbalize understanding of medication therapies will improve Outcome: Adequate for Discharge Goal:  Individualized Educational Video(s) Outcome: Adequate for Discharge   Problem: Activity: Goal: Capacity to carry out activities will improve Outcome: Adequate for Discharge   Problem: Cardiac: Goal: Ability to achieve and maintain adequate cardiopulmonary perfusion will improve Outcome: Adequate for Discharge   Problem: Education: Goal: Knowledge of disease or condition will improve Outcome: Adequate for Discharge Goal: Understanding of medication regimen will improve Outcome: Adequate for Discharge Goal: Individualized Educational Video(s) Outcome: Adequate for Discharge   Problem: Activity: Goal: Ability to tolerate increased activity will improve Outcome: Adequate for Discharge   Problem: Cardiac: Goal: Ability to achieve and maintain adequate cardiopulmonary perfusion will improve Outcome: Adequate for Discharge   Problem: Health Behavior/Discharge Planning: Goal: Ability to safely manage health-related needs after discharge will improve Outcome: Adequate for Discharge   

## 2021-12-23 LAB — RENAL FUNCTION PANEL
Albumin: 3.1 g/dL — ABNORMAL LOW (ref 3.5–5.0)
Anion gap: 8 (ref 5–15)
BUN: 11 mg/dL (ref 6–20)
CO2: 21 mmol/L — ABNORMAL LOW (ref 22–32)
Calcium: 8.8 mg/dL — ABNORMAL LOW (ref 8.9–10.3)
Chloride: 112 mmol/L — ABNORMAL HIGH (ref 98–111)
Creatinine, Ser: 1.03 mg/dL — ABNORMAL HIGH (ref 0.44–1.00)
GFR, Estimated: >60 mL/min (ref >60)
Glucose, Bld: 94 mg/dL (ref 70–99)
Phosphorus: 3.1 mg/dL (ref 2.5–4.6)
Potassium: 4 mmol/L (ref 3.5–5.1)
Sodium: 141 mmol/L (ref 135–145)

## 2021-12-23 LAB — MAGNESIUM: Magnesium: 2 mg/dL (ref 1.7–2.4)

## 2021-12-23 NOTE — Plan of Care (Signed)
  Problem: Education: Goal: Knowledge of General Education information will improve Description: Including pain rating scale, medication(s)/side effects and non-pharmacologic comfort measures Outcome: Progressing   Problem: Health Behavior/Discharge Planning: Goal: Ability to manage health-related needs will improve Outcome: Progressing   Problem: Clinical Measurements: Goal: Ability to maintain clinical measurements within normal limits will improve Outcome: Progressing   Problem: Elimination: Goal: Will not experience complications related to bowel motility Outcome: Progressing   Problem: Pain Managment: Goal: General experience of comfort will improve Outcome: Progressing   Problem: Health Behavior/Discharge Planning: Goal: Ability to safely manage health-related needs after discharge will improve Outcome: Progressing

## 2021-12-23 NOTE — Progress Notes (Signed)
[facility]                                                                               Christina Cannon, is a 56 y.o. female, DOB - 28-Feb-1966, KPT:[id number] Admit date - 12/20/2021    Outpatient Primary MD for the patient is Ladell Pier, MD  LOS - 2  days    Brief summary   Patient is a 56 year old female past medical history significant for diverticulitis.  Patient was admitted with 2-week history of lower abdominal pain.  CT scan of the abdomen and pelvis done revealed sigmoid diverticulitis.  Patient is currently on IV Zosyn. Of note , patient has significant diarrhea , about 4 episodes today already.    Assessment & Plan    Assessment and Plan:   Acute sigmoid diverticulitis: CT abd and pelvis showed mild sigmoid diverticulitis.  She was started on IV antibiotics, plan to transition to oral antibiotics in am.  Pt continues to have diarrhea., will transition to liquid diet today and see if diarrhea improves.  She will need outpatient follow up with Gi for possible colonoscopy.  She remains afebrile and improving leukocytosis.    Hypomagnesemia and hypokalemia Replaced.     Tobacco abuse:  Counseled to stop smoking.     Estimated body mass index is 36.01 kg/m as calculated from the following:   Height as of this encounter: '5\' 7"'$  (1.702 m).   Weight as of this encounter: 104.3 kg.  Code Status: full code.  DVT Prophylaxis:  enoxaparin (LOVENOX) injection 40 mg Start: 12/20/21 1515   Level of Care: Level of care: Telemetry Medical Family Communication: family at bedside.   Disposition Plan:     Remains inpatient appropriate:  persistent diarrhea.   Procedures:  CT abd and pelvis.   Consultants:   None.   Antimicrobials:   Anti-infectives (From admission, onward)    Start     Dose/Rate Route Frequency Ordered Stop   12/20/21 2200  metroNIDAZOLE (FLAGYL) IVPB 500 mg  Status:  Discontinued        500 mg 100 mL/hr over 60  Minutes Intravenous Every 12 hours 12/20/21 1513 12/20/21 1521   12/20/21 1530  piperacillin-tazobactam (ZOSYN) IVPB 3.375 g        3.375 g 12.5 mL/hr over 240 Minutes Intravenous Every 8 hours 12/20/21 1520     12/20/21 1500  ciprofloxacin (CIPRO) IVPB 400 mg  Status:  Discontinued        400 mg 200 mL/hr over 60 Minutes Intravenous Every 12 hours 12/20/21 1458 12/20/21 1521   12/20/21 1445  ciprofloxacin (CIPRO) IVPB 400 mg  Status:  Discontinued        400 mg 200 mL/hr over 60 Minutes Intravenous  Once 12/20/21 1436 12/20/21 1457   12/20/21 1445  metroNIDAZOLE (FLAGYL) IVPB 500 mg  Status:  Discontinued        500 mg 100 mL/hr over 60 Minutes Intravenous  Once 12/20/21 1436 12/20/21 1605        Medications  Scheduled Meds:  enoxaparin (LOVENOX) injection  40 mg Subcutaneous Q24H   sodium chloride flush  3 mL Intravenous Q12H   Continuous Infusions:  0.9 % NaCl with KCl 20 mEq / L 50 mL/hr at 12/23/21 1334   piperacillin-tazobactam (ZOSYN)  IV 3.375 g (12/23/21 1421)   PRN Meds:.acetaminophen **OR** acetaminophen, albuterol, HYDROcodone-acetaminophen    Subjective:   Christina Cannon was seen and examined today.  Diarrhea 4 times already today.  No nausea, vomiting or abd pain.   Objective:   Vitals:   12/22/21 1223 12/22/21 2010 12/23/21 0436 12/23/21 0859  BP: (!) 164/86 (!) 159/86 (!) 148/76 (!) 146/80  Pulse:  (!) 59 (!) 57 (!) 58  Resp: '20 16 16 17  '$ Temp: 98.6 F (37 C) 99.1 F (37.3 C) 98.4 F (36.9 C) 97.9 F (36.6 C)  TempSrc: Oral Oral Oral Axillary  SpO2: 99%  100% 99%  Weight:      Height:       No intake or output data in the 24 hours ending 12/23/21 1814 Filed Weights   12/20/21 1640  Weight: 104.3 kg     Exam General exam: Appears calm and comfortable  Respiratory system: Clear to auscultation. Respiratory effort normal. Cardiovascular system: S1 & S2 heard, RRR.  No pedal edema. Gastrointestinal system: Abdomen is  soft, tenderness  in the left lower quadrant , no rebound tenderness. Bowel sounds heard.  Central nervous system: Alert and oriented. No focal neurological deficits. Extremities: Symmetric 5 x 5 power. Skin: No rashes, lesions or ulcers Psychiatry:Mood & affect appropriate.     Data Reviewed:  I have personally reviewed following labs and imaging studies   CBC Lab Results  Component Value Date   WBC 8.2 12/21/2021   RBC 4.72 12/21/2021   HGB 13.8 12/21/2021   HCT 39.2 12/21/2021   MCV 83.1 12/21/2021   MCH 29.2 12/21/2021   PLT 206 12/21/2021   MCHC 35.2 12/21/2021   RDW 12.9 12/21/2021   LYMPHSABS 2.1 12/20/2021   MONOABS 0.6 12/20/2021   EOSABS 0.1 12/20/2021   BASOSABS 0.0 71/24/5809     Last metabolic panel Lab Results  Component Value Date   NA 141 12/23/2021   K 4.0 12/23/2021   CL 112 (H) 12/23/2021   CO2 21 (L) 12/23/2021   BUN 11 12/23/2021   CREATININE 1.03 (H) 12/23/2021   GLUCOSE 94 12/23/2021   GFRNONAA >60 12/23/2021   GFRAA >60 06/19/2017   CALCIUM 8.8 (L) 12/23/2021   PHOS 3.1 12/23/2021   PROT 7.1 12/20/2021   ALBUMIN 3.1 (L) 12/23/2021   BILITOT 0.7 12/20/2021   ALKPHOS 64 12/20/2021   AST 14 (L) 12/20/2021   ALT 12 12/20/2021   ANIONGAP 8 12/23/2021    CBG (last 3)  No results for input(s): GLUCAP in the last 72 hours.    Coagulation Profile: No results for input(s): INR, PROTIME in the last 168 hours.   Radiology Studies: No results found.     Hosie Poisson M.D. [facility] 12/23/2021, 6:14 PM  Available via Epic secure chat 7am-7pm After 7 pm, please refer to night coverage provider listed on amion.

## 2021-12-24 LAB — BASIC METABOLIC PANEL
Anion gap: 6 (ref 5–15)
BUN: 11 mg/dL (ref 6–20)
CO2: 22 mmol/L (ref 22–32)
Calcium: 8.7 mg/dL — ABNORMAL LOW (ref 8.9–10.3)
Chloride: 110 mmol/L (ref 98–111)
Creatinine, Ser: 0.98 mg/dL (ref 0.44–1.00)
GFR, Estimated: >60 mL/min (ref >60)
Glucose, Bld: 89 mg/dL (ref 70–99)
Potassium: 3.6 mmol/L (ref 3.5–5.1)
Sodium: 138 mmol/L (ref 135–145)

## 2021-12-24 LAB — CBC WITH DIFFERENTIAL/PLATELET
Abs Immature Granulocytes: 0.04 10*3/uL (ref 0.00–0.07)
Basophils Absolute: 0 10*3/uL (ref 0.0–0.1)
Basophils Relative: 0 %
Eosinophils Absolute: 0.1 10*3/uL (ref 0.0–0.5)
Eosinophils Relative: 2 %
HCT: 39.7 % (ref 36.0–46.0)
Hemoglobin: 13.7 g/dL (ref 12.0–15.0)
Immature Granulocytes: 1 %
Lymphocytes Relative: 30 %
Lymphs Abs: 2.6 10*3/uL (ref 0.7–4.0)
MCH: 28.7 pg (ref 26.0–34.0)
MCHC: 34.5 g/dL (ref 30.0–36.0)
MCV: 83.2 fL (ref 80.0–100.0)
Monocytes Absolute: 0.7 10*3/uL (ref 0.1–1.0)
Monocytes Relative: 8 %
Neutro Abs: 5.1 10*3/uL (ref 1.7–7.7)
Neutrophils Relative %: 59 %
Platelets: 207 10*3/uL (ref 150–400)
RBC: 4.77 MIL/uL (ref 3.87–5.11)
RDW: 12.8 % (ref 11.5–15.5)
WBC: 8.5 10*3/uL (ref 4.0–10.5)
nRBC: 0 % (ref 0.0–0.2)

## 2021-12-24 NOTE — Plan of Care (Signed)
  Problem: Education: Goal: Knowledge of General Education information will improve Description: Including pain rating scale, medication(s)/side effects and non-pharmacologic comfort measures Outcome: Progressing   Problem: Health Behavior/Discharge Planning: Goal: Ability to manage health-related needs will improve Outcome: Progressing   Problem: Pain Managment: Goal: General experience of comfort will improve Outcome: Progressing   Problem: Health Behavior/Discharge Planning: Goal: Ability to safely manage health-related needs after discharge will improve Outcome: Progressing

## 2021-12-24 NOTE — Progress Notes (Signed)
[facility]                                                                               Christina Cannon, is a 56 y.o. female, DOB - [birth year], BCW:[id number] Admit date - 12/20/2021    Outpatient Primary MD for the patient is Ladell Pier, MD  LOS - 3  days    Brief summary   Patient is a 56 year old female past medical history significant for diverticulitis.  Patient was admitted with 2-week history of lower abdominal pain.  CT scan of the abdomen and pelvis done revealed sigmoid diverticulitis.  Patient is currently on IV Zosyn. Of note , patient has significant diarrhea , about 4 episodes today already.    Assessment & Plan    Assessment and Plan:   Acute sigmoid diverticulitis: CT abd and pelvis showed mild sigmoid diverticulitis.  She was started on IV antibiotics, plan to transition to oral antibiotics in am.  She did better with liquid diet, transition to soft diet today and monitor overnight.  She will need outpatient follow up with Gi for possible colonoscopy.  She remains afebrile and improving leukocytosis.    Hypomagnesemia and hypokalemia Replaced.     Tobacco abuse:  Counseled to stop smoking.     Estimated body mass index is 36.01 kg/m as calculated from the following:   Height as of this encounter: '5\' 7"'$  (1.702 m).   Weight as of this encounter: 104.3 kg.  Code Status: full code.  DVT Prophylaxis:  enoxaparin (LOVENOX) injection 40 mg Start: 12/20/21 1515   Level of Care: Level of care: Telemetry Medical Family Communication: family at bedside.   Disposition Plan:     Remains inpatient appropriate:  persistent diarrhea.   Procedures:  CT abd and pelvis.   Consultants:   None.   Antimicrobials:   Anti-infectives (From admission, onward)    Start     Dose/Rate Route Frequency Ordered Stop   12/20/21 2200  metroNIDAZOLE (FLAGYL) IVPB 500 mg  Status:  Discontinued        500 mg 100 mL/hr over 60 Minutes  Intravenous Every 12 hours 12/20/21 1513 12/20/21 1521   12/20/21 1530  piperacillin-tazobactam (ZOSYN) IVPB 3.375 g        3.375 g 12.5 mL/hr over 240 Minutes Intravenous Every 8 hours 12/20/21 1520     12/20/21 1500  ciprofloxacin (CIPRO) IVPB 400 mg  Status:  Discontinued        400 mg 200 mL/hr over 60 Minutes Intravenous Every 12 hours 12/20/21 1458 12/20/21 1521   12/20/21 1445  ciprofloxacin (CIPRO) IVPB 400 mg  Status:  Discontinued        400 mg 200 mL/hr over 60 Minutes Intravenous  Once 12/20/21 1436 12/20/21 1457   12/20/21 1445  metroNIDAZOLE (FLAGYL) IVPB 500 mg  Status:  Discontinued        500 mg 100 mL/hr over 60 Minutes Intravenous  Once 12/20/21 1436 12/20/21 1605        Medications  Scheduled Meds:  enoxaparin (LOVENOX) injection  40 mg Subcutaneous Q24H   sodium chloride flush  3 mL Intravenous Q12H   Continuous Infusions:  piperacillin-tazobactam (  ZOSYN)  IV 3.375 g (12/24/21 1430)   PRN Meds:.acetaminophen **OR** acetaminophen, albuterol, HYDROcodone-acetaminophen    Subjective:   Christina Cannon was seen and examined today.  Diarrhea improving. Would like to try soft diet today.   Objective:   Vitals:   12/23/21 0859 12/23/21 2011 12/24/21 0441 12/24/21 0900  BP: (!) 146/80 (!) 164/88 (!) 145/73 (!) 141/84  Pulse: (!) 58 73 (!) 58 62  Resp: '17 16 16 17  '$ Temp: 97.9 F (36.6 C) 98.7 F (37.1 C) 98.4 F (36.9 C) 98.2 F (36.8 C)  TempSrc: Axillary Oral Oral Oral  SpO2: 99%   100%  Weight:      Height:        Intake/Output Summary (Last 24 hours) at 12/24/2021 1718 Last data filed at 12/24/2021 0133 Gross per 24 hour  Intake 200 ml  Output --  Net 200 ml   Filed Weights   12/20/21 1640  Weight: 104.3 kg     Exam General exam: Appears calm and comfortable  Respiratory system: Clear to auscultation. Respiratory effort normal. Cardiovascular system: S1 & S2 heard, RRR. No JVD, murmurs, rubs, gallops or clicks. No pedal  edema. Gastrointestinal system: Abdomen is nondistended, soft and nontender. No organomegaly or masses felt. Normal bowel sounds heard. Central nervous system: Alert and oriented. No focal neurological deficits. Extremities: Symmetric 5 x 5 power. Skin: No rashes, lesions or ulcers Psychiatry: Judgement and insight appear normal. Mood & affect appropriate.     Data Reviewed:  I have personally reviewed following labs and imaging studies   CBC Lab Results  Component Value Date   WBC 8.5 12/24/2021   RBC 4.77 12/24/2021   HGB 13.7 12/24/2021   HCT 39.7 12/24/2021   MCV 83.2 12/24/2021   MCH 28.7 12/24/2021   PLT 207 12/24/2021   MCHC 34.5 12/24/2021   RDW 12.8 12/24/2021   LYMPHSABS 2.6 12/24/2021   MONOABS 0.7 12/24/2021   EOSABS 0.1 12/24/2021   BASOSABS 0.0 76/22/6333     Last metabolic panel Lab Results  Component Value Date   NA 138 12/24/2021   K 3.6 12/24/2021   CL 110 12/24/2021   CO2 22 12/24/2021   BUN 11 12/24/2021   CREATININE 0.98 12/24/2021   GLUCOSE 89 12/24/2021   GFRNONAA >60 12/24/2021   GFRAA >60 06/19/2017   CALCIUM 8.7 (L) 12/24/2021   PHOS 3.1 12/23/2021   PROT 7.1 12/20/2021   ALBUMIN 3.1 (L) 12/23/2021   BILITOT 0.7 12/20/2021   ALKPHOS 64 12/20/2021   AST 14 (L) 12/20/2021   ALT 12 12/20/2021   ANIONGAP 6 12/24/2021    CBG (last 3)  No results for input(s): GLUCAP in the last 72 hours.    Coagulation Profile: No results for input(s): INR, PROTIME in the last 168 hours.   Radiology Studies: No results found.     Hosie Poisson M.D. [facility] 12/24/2021, 5:18 PM  Available via Epic secure chat 7am-7pm After 7 pm, please refer to night coverage provider listed on amion.

## 2021-12-25 MED ORDER — AMOXICILLIN-POT CLAVULANATE 875-125 MG PO TABS: 1.0000 | ORAL_TABLET | Freq: Two times a day (BID) | ORAL | 0 refills | Status: AC

## 2021-12-25 NOTE — Discharge Summary (Signed)
Physician Discharge Summary   Patient: Christina Cannon MRN: [id number] DOB: 04-26-1966  Admit date:     12/20/2021  Discharge date: 12/25/21  Discharge Physician: Hosie Poisson   PCP: Ladell Pier, MD   Recommendations at discharge:  Please follow up with Gastroenterology in 2 weeks.  Please follow up with CBC and BMP in one week.   Discharge Diagnoses: Principal Problem:   Diverticulitis Active Problems:   Leukocytosis   Hypokalemia   Tobacco use   Obesity (BMI 30-39.9)   Prolonged QT interval   Colitis   Hospital Course: Patient is a 56 year old female past medical history significant for diverticulitis.  Patient was admitted with 2-week history of lower abdominal pain.  CT scan of the abdomen and pelvis done revealed sigmoid diverticulitis.  Patient ws started on zosyn and her symptoms have improved.      Assessment and Plan:  Acute sigmoid diverticulitis: CT abd and pelvis showed mild sigmoid diverticulitis.  She was started on IV antibiotics, transitioned to oral today.  She will need outpatient follow up with Gi for possible colonoscopy.  She remains afebrile and improving leukocytosis.      Hypomagnesemia and hypokalemia Replaced.        Tobacco abuse:  Counseled to stop smoking.      Estimated body mass index is 36.01 kg/m as calculated from the following:   Height as of this encounter: '5\' 7"'$  (1.702 m).   Weight as of this encounter: 104.3 kg. .obesity.  Body mass index is 36.01 kg/m. Recommend outpatient follow up with PCP for weight loss.          Consultants: none.  Procedures performed: none.   Disposition: Home Diet recommendation:  Discharge Diet Orders (From admission, onward)     Start     Ordered   12/25/21 0000  Diet - low sodium heart healthy        12/25/21 1055           Regular diet DISCHARGE MEDICATION: Allergies as of 12/25/2021       Reactions   Ceftin [cefuroxime Axetil] Anaphylaxis, Swelling   Throat  and face swell. Pt stated she had a regimen in the hospital recently(2018) and did just fine.        Medication List     STOP taking these medications    celecoxib 200 MG capsule Commonly known as: CeleBREX   diclofenac sodium 1 % Gel Commonly known as: VOLTAREN   HYDROcodone-acetaminophen 5-325 MG tablet Commonly known as: NORCO/VICODIN   ondansetron 8 MG disintegrating tablet Commonly known as: Zofran ODT       TAKE these medications    acetaminophen 500 MG tablet Commonly known as: TYLENOL Take 500 mg every 6 (six) hours as needed by mouth (for pain).   amoxicillin-clavulanate 875-125 MG tablet Commonly known as: AUGMENTIN Take 1 tablet by mouth every 12 (twelve) hours for 9 days.   aspirin EC 325 MG tablet Take 325 mg every 6 (six) hours as needed by mouth for pain.        Discharge Exam: Filed Weights   12/20/21 1640  Weight: 104.3 kg   General exam: Appears calm and comfortable  Respiratory system: Clear to auscultation. Respiratory effort normal. Cardiovascular system: S1 & S2 heard, RRR. No JVD, murmurs, rubs, gallops or clicks. No pedal edema. Gastrointestinal system: Abdomen is nondistended, soft and nontender. No organomegaly or masses felt. Normal bowel sounds heard. Central nervous system: Alert and oriented. No focal neurological deficits. Extremities:  Symmetric 5 x 5 power. Skin: No rashes, lesions or ulcers Psychiatry: Judgement and insight appear normal. Mood & affect appropriate.    Condition at discharge: fair  The results of significant diagnostics from this hospitalization (including imaging, microbiology, ancillary and laboratory) are listed below for reference.   Imaging Studies: CT ABDOMEN PELVIS W CONTRAST  Result Date: 12/20/2021 CLINICAL DATA:  Lower abdominal pain for 1 week. Nausea and vomiting. Recent sigmoid diverticulitis. EXAM: CT ABDOMEN AND PELVIS WITH CONTRAST TECHNIQUE: Multidetector CT imaging of the abdomen and  pelvis was performed using the standard protocol following bolus administration of intravenous contrast. RADIATION DOSE REDUCTION: This exam was performed according to the departmental dose-optimization program which includes automated exposure control, adjustment of the mA and/or kV according to patient size and/or use of iterative reconstruction technique. CONTRAST:  151m OMNIPAQUE IOHEXOL 300 MG/ML  SOLN COMPARISON:  08/31/2021 FINDINGS: Lower Chest: No acute findings. Hepatobiliary: No hepatic masses identified. Mild-to-moderate diffuse hepatic steatosis again seen. Prior cholecystectomy. No evidence of biliary obstruction. Pancreas:  No mass or inflammatory changes. Spleen: Within normal limits in size and appearance. Adrenals/Urinary Tract: No masses identified. No evidence of ureteral calculi or hydronephrosis. Stomach/Bowel: Mild diverticulitis is again seen involving the sigmoid colon, minimally increased since previous study. No evidence of perforation or abscess. Vascular/Lymphatic: No pathologically enlarged lymph nodes. No acute vascular findings. Aortic atherosclerotic calcification incidentally noted. Reproductive: Small uterine fibroids again noted, largest measuring 3 cm. Adnexal regions are unremarkable. Other:  None. Musculoskeletal:  No suspicious bone lesions identified. IMPRESSION: Mild sigmoid diverticulitis, minimally increased since previous study. No evidence of perforation or abscess. Stable small uterine fibroids. Stable hepatic steatosis. Aortic Atherosclerosis (ICD10-I70.0). Electronically Signed   By: JMarlaine HindM.D.   On: 12/20/2021 12:47    Microbiology: Results for orders placed or performed during the hospital encounter of 08/31/21  Urine Culture     Status: None   Collection Time: 08/31/21 11:51 AM   Specimen: Urine, Clean Catch  Result Value Ref Range Status   Specimen Description URINE, CLEAN CATCH  Final   Special Requests NONE  Final   Culture   Final    NO  GROWTH [facility] [id number], [place] [address]   Report Status 09/01/2021 FINAL  Final    Labs: CBC: Recent Labs  Lab 12/20/21 1048 12/21/21 0429 12/24/21 0334  WBC 10.7* 8.2 8.5  NEUTROABS 7.9*  --  5.1  HGB 14.3 13.8 13.7  HCT 42.7 39.2 39.7  MCV 84.6 83.1 83.2  PLT 219 206 2607  Basic Metabolic Panel: Recent Labs  Lab 12/21/21 0429 12/21/21 1515 12/22/21 0205 12/23/21 0228 12/24/21 0334  NA 142 139 140 141 138  K 2.8* 2.7* 3.4* 4.0 3.6  CL 105 104 108 112* 110  CO2 '30 28 27 '$ 21* 22  GLUCOSE 98 144* 105* 94 89  BUN '7 7 10 11 11  '$ CREATININE 0.99 1.03* 1.01* 1.03* 0.98  CALCIUM 8.3* 8.3* 8.5* 8.8* 8.7*  MG 1.9 2.2 2.2 2.0  --   PHOS  --  2.2* 3.4 3.1  --    Liver Function Tests: Recent Labs  Lab 12/20/21 1048 12/21/21 1515 12/22/21 0205 12/23/21 0228  AST 14*  --   --   --   ALT 12  --   --   --   ALKPHOS 64  --   --   --   BILITOT 0.7  --   --   --  PROT 7.1  --   --   --   ALBUMIN 3.4* 3.2* 3.0* 3.1*   CBG: No results for input(s): GLUCAP in the last 168 hours.  Discharge time spent: 38 minutes.   Signed: Hosie Poisson, MD [facility] 12/25/2021

## 2021-12-26 ENCOUNTER — Telehealth: Payer: Self-pay

## 2021-12-26 NOTE — Telephone Encounter (Signed)
Transition Care Management Follow-up Telephone Call Date of discharge and from where: 12/25/2021, [facility] How have you been since you were released from the hospital? She stated that she is feeling better, trying to get back to work Any questions or concerns? No  Items Reviewed: Did the pt receive and understand the discharge instructions provided? Yes  Medications obtained and verified? Yes - the only medication she has to take is augmentin and she has that  Other? No  Any new allergies since your discharge? No  Dietary orders reviewed? Yes and she said she tolerated a seafood meal last night without any problems.  Do you have support at home? Yes   Home Care and Equipment/Supplies: Were home health services ordered? no If so, what is the name of the agency? N/a  Has the agency set up a time to come to the patient's home? not applicable Were any new equipment or medical supplies ordered?  No What is the name of the medical supply agency? N/a Were you able to get the supplies/equipment? not applicable Do you have any questions related to the use of the equipment or supplies? No  Functional Questionnaire: (I = Independent and D = Dependent) ADLs: independent.   Follow up appointments reviewed:  PCP Hospital f/u appt confirmed? Yes  Scheduled to see Dr Wynetta Emery  - 01/19/2022.  [facility] f/u appt confirmed?  None scheduled at this time.    Are transportation arrangements needed? No  If their condition worsens, is the pt aware to call PCP or go to the Emergency Dept.? Yes Was the patient provided with contact information for the PCP's office or ED? Yes Was to pt encouraged to call back with questions or concerns? Yes

## 2022-01-19 ENCOUNTER — Encounter: Payer: Self-pay | Admitting: Internal Medicine

## 2022-01-19 ENCOUNTER — Ambulatory Visit: Payer: Commercial Managed Care - HMO | Attending: Internal Medicine | Admitting: Internal Medicine

## 2022-01-19 VITALS — BP 127/84 | HR 82 | Wt 223.2 lb

## 2022-01-19 DIAGNOSIS — E669 Obesity, unspecified: Secondary | ICD-10-CM

## 2022-01-19 DIAGNOSIS — Z6834 Body mass index (BMI) 34.0-34.9, adult: Secondary | ICD-10-CM | POA: Diagnosis not present

## 2022-01-19 DIAGNOSIS — K5909 Other constipation: Secondary | ICD-10-CM

## 2022-01-19 DIAGNOSIS — Z7689 Persons encountering health services in other specified circumstances: Secondary | ICD-10-CM

## 2022-01-19 DIAGNOSIS — F17210 Nicotine dependence, cigarettes, uncomplicated: Secondary | ICD-10-CM | POA: Diagnosis not present

## 2022-01-19 DIAGNOSIS — F17200 Nicotine dependence, unspecified, uncomplicated: Secondary | ICD-10-CM

## 2022-01-19 DIAGNOSIS — Z1211 Encounter for screening for malignant neoplasm of colon: Secondary | ICD-10-CM

## 2022-01-19 DIAGNOSIS — Z1231 Encounter for screening mammogram for malignant neoplasm of breast: Secondary | ICD-10-CM

## 2022-01-19 DIAGNOSIS — Z8719 Personal history of other diseases of the digestive system: Secondary | ICD-10-CM

## 2022-01-19 DIAGNOSIS — E66811 Obesity, class 1: Secondary | ICD-10-CM

## 2022-01-19 NOTE — Progress Notes (Signed)
Patient states to still having abdominal and some constipation, states that days before she was having diarrhea.

## 2022-01-19 NOTE — Progress Notes (Signed)
Patient ID: Christina Cannon, female    DOB: Oct 15, 1965  MRN: [id number]  CC: Hospitalization Follow-up   Subjective: Christina Cannon is a 56 y.o. female who presents for hospital follow-up and to reestablish care. Her concerns today include:  Pt with hx of tob dep, diverticulosis, obese, OA knees/ankles,  Patient hospitalized 5/31-12/25/2021 with 2-week history of lower abdominal pain.  CAT scan of the abdomen and pelvis reveals sigmoid diverticulitis.  Patient treated with antibiotics with improvement.  She was discharged home in stable condition and told to follow-up with gastroenterologist in 2 weeks.  Today: Patient reports that she is doing much better.  Sometimes at night she has a little soreness across the lower abdomen which is relieved with Tylenol.  She is eating normally.  No fever.  Last week she had diarrhea with some blood in several of the bowel movements.  This week she is bothered with constipation.  She does not have an appointment with GI.  Tobacco dependence: She smokes about 2 cigarettes a day.  She would like to quit but finds herself smoking whenever she feels stressed.  Obesity: Weight is up 25 pounds since 2019.  States she does not eat too much but her weakness is sugary drinks including sodas and juices.  Does a lot of walking at work.  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  HM: She is due for mammogram and colon cancer screening. Patient Active Problem List   Diagnosis Date Noted   Prolonged QT interval 12/21/2021   Colitis 12/21/2021   Diverticulitis 12/20/2021   Hypokalemia 12/20/2021   Obesity (BMI 30-39.9) 12/20/2021   Tobacco use 12/20/2021   History of colonic diverticulitis 07/09/2017   Primary osteoarthritis of both knees 07/09/2017   Primary osteoarthritis of both ankles 07/09/2017   Tobacco dependence 07/09/2017   Callous ulcer, limited to breakdown of skin ([place]) 07/09/2017   Elevated blood pressure reading 07/09/2017    Leukocytosis 06/10/2017   Skin tag of vulva 08/10/2015     Current Outpatient Medications on File Prior to Visit  Medication Sig Dispense Refill   acetaminophen (TYLENOL) 500 MG tablet Take 500 mg every 6 (six) hours as needed by mouth (for pain).     aspirin 325 MG EC tablet Take 325 mg every 6 (six) hours as needed by mouth for pain.  (Patient not taking: Reported on 01/19/2022)     No current facility-administered medications on file prior to visit.    Allergies  Allergen Reactions   Ceftin [Cefuroxime Axetil] Anaphylaxis and Swelling    Throat and face swell. Pt stated she had a regimen in the hospital recently(2018) and did just fine.    Social History   Socioeconomic History   Marital status: Widowed    Spouse name: Not on file   Number of children: Not on file   Years of education: Not on file   Highest education level: Not on file  Occupational History   Not on file  Tobacco Use   Smoking status: Former    Packs/day: 0.50    Types: Cigarettes   Smokeless tobacco: Never  Vaping Use   Vaping Use: Never used  Substance and Sexual Activity   Alcohol use: No   Drug use: No    Types: Marijuana    Comment: Prior use   Sexual activity: Yes    Birth control/protection: None  Other Topics Concern   Not on file  Social History Narrative   Not on file  Social Determinants of Health   Financial Resource Strain: Not on file  Food Insecurity: Not on file  Transportation Needs: Not on file  Physical Activity: Insufficiently Active (09/05/2017)   Exercise Vital Sign    Days of Exercise per Week: 2 days    Minutes of Exercise per Session: 10 min  Stress: No Stress Concern Present (09/05/2017)   [place]    Feeling of Stress : Not at all  Social Connections: Somewhat Isolated (09/05/2017)   Social Connection and Isolation Panel [NHANES]    Frequency of Communication with Friends and Family: Twice a  week    Frequency of Social Gatherings with Friends and Family: Twice a week    Attends Religious Services: More than 4 times per year    Active Member of [employer] or Organizations: No    Attends Archivist Meetings: Never    Marital Status: Widowed  Intimate Partner Violence: Not At Risk (09/05/2017)   Humiliation, Afraid, Rape, and Kick questionnaire    Fear of Current or Ex-Partner: No    Emotionally Abused: No    Physically Abused: No    Sexually Abused: No    Family History  Problem Relation Age of Onset   Diabetes Mother    Hypertension Mother    Diabetes Father    Hypertension Father    Heart failure Father     Past Surgical History:  Procedure Laterality Date   CHOLECYSTECTOMY     HAND SURGERY      ROS: Review of Systems Negative except as stated above  PHYSICAL EXAM: BP 127/84   Pulse 82   Wt 223 lb 3.2 oz (101.2 kg)   SpO2 93%   BMI 34.96 kg/m   Wt Readings from Last 3 Encounters:  01/19/22 223 lb 3.2 oz (101.2 kg)  12/20/21 229 lb 15 oz (104.3 kg)  11/12/17 198 lb (89.8 kg)    Physical Exam  General appearance - alert, well appearing, obese older AAF and in no distress Mental status - normal mood, behavior, speech, dress, motor activity, and thought processes Chest - clear to auscultation, no wheezes, rales or rhonchi, symmetric air entry Heart - normal rate, regular rhythm, normal S1, S2, no murmurs, rubs, clicks or gallops Abdomen - soft, nontender, nondistended, no masses or organomegaly Extremities - peripheral pulses normal, no pedal edema, no clubbing or cyanosis      Latest Ref Rng & Units 12/24/2021    3:34 AM 12/23/2021    2:28 AM 12/22/2021    2:05 AM  CMP  Glucose 70 - 99 mg/dL 89  94  105   BUN 6 - 20 mg/dL '11  11  10   '$ Creatinine 0.44 - 1.00 mg/dL 0.98  1.03  1.01   Sodium 135 - 145 mmol/L 138  141  140   Potassium 3.5 - 5.1 mmol/L 3.6  4.0  3.4   Chloride 98 - 111 mmol/L 110  112  108   CO2 22 - 32 mmol/L '22  21  27    '$ Calcium 8.9 - 10.3 mg/dL 8.7  8.8  8.5     CBC    Component Value Date/Time   WBC 8.5 12/24/2021 0334   RBC 4.77 12/24/2021 0334   HGB 13.7 12/24/2021 0334   HCT 39.7 12/24/2021 0334   PLT 207 12/24/2021 0334   MCV 83.2 12/24/2021 0334   MCH 28.7 12/24/2021 0334   MCHC 34.5 12/24/2021 0334  RDW 12.8 12/24/2021 0334   LYMPHSABS 2.6 12/24/2021 0334   MONOABS 0.7 12/24/2021 0334   EOSABS 0.1 12/24/2021 0334   BASOSABS 0.0 12/24/2021 0334    ASSESSMENT AND PLAN: 1. Encounter to establish care  2. History of colonic diverticulitis Patient is doing much better.  I recommend follow-up with GI for this and for colon cancer screening.  She is agreeable to that. - Ambulatory referral to Gastroenterology  3. Other constipation Encourage increase fiber in the diet by incorporating green leafy vegetables and fruits into the diet.  If constipation still persists despite increase in dietary fiber, I recommend using MiraLAX over-the-counter as needed.  4. Tobacco dependence Strongly advised to quit.  Discussed health risks associated with smoking.  5. Obesity (BMI 30.0-34.9) Patient advised to eliminate sugary drinks from the diet, cut back on portion sizes especially of white carbohydrates, eat more white lean meat like chicken [place] and seafood instead of beef or pork and incorporate fresh fruits and vegetables into the diet daily. Encouraged her to get in some exercise outside of work like brisk walking several days a week.  6. Screening for colon cancer - Ambulatory referral to Gastroenterology  7. Encounter for screening mammogram for malignant neoplasm of breast - MM Digital Screening; Future   Patient was given the opportunity to ask questions.  Patient verbalized understanding of the plan and was able to repeat key elements of the plan.   This documentation was completed using Radio producer.  Any transcriptional errors are unintentional.  Orders  Placed This Encounter  Procedures   MM Digital Screening   Ambulatory referral to Gastroenterology     Requested Prescriptions    No prescriptions requested or ordered in this encounter    Return in about 6 weeks (around 03/02/2022) for PAP.  Karle Plumber, MD, FACP

## 2022-01-19 NOTE — Patient Instructions (Signed)

## 2022-02-13 ENCOUNTER — Ambulatory Visit
Admission: RE | Admit: 2022-02-13 | Discharge: 2022-02-13 | Disposition: A | Discharge status: Home / Self Care | Payer: Commercial Managed Care - HMO | Source: Ambulatory Visit | Attending: Internal Medicine | Admitting: Internal Medicine

## 2022-02-13 DIAGNOSIS — Z1231 Encounter for screening mammogram for malignant neoplasm of breast: Secondary | ICD-10-CM

## 2022-02-25 ENCOUNTER — Emergency Department (HOSPITAL_COMMUNITY): Payer: Commercial Managed Care - HMO

## 2022-02-25 ENCOUNTER — Encounter (HOSPITAL_COMMUNITY): Payer: Self-pay

## 2022-02-25 ENCOUNTER — Emergency Department (HOSPITAL_COMMUNITY)
Admission: EM | Admit: 2022-02-25 | Discharge: 2022-02-25 | Disposition: A | Discharge status: Home / Self Care | Payer: Commercial Managed Care - HMO | Attending: Emergency Medicine | Admitting: Emergency Medicine

## 2022-02-25 DIAGNOSIS — J111 Influenza due to unidentified influenza virus with other respiratory manifestations: Secondary | ICD-10-CM

## 2022-02-25 DIAGNOSIS — J101 Influenza due to other identified influenza virus with other respiratory manifestations: Secondary | ICD-10-CM | POA: Diagnosis not present

## 2022-02-25 DIAGNOSIS — Z20822 Contact with and (suspected) exposure to covid-19: Secondary | ICD-10-CM | POA: Insufficient documentation

## 2022-02-25 DIAGNOSIS — Z7982 Long term (current) use of aspirin: Secondary | ICD-10-CM | POA: Diagnosis not present

## 2022-02-25 DIAGNOSIS — R5383 Other fatigue: Secondary | ICD-10-CM | POA: Diagnosis present

## 2022-02-25 DIAGNOSIS — Z79899 Other long term (current) drug therapy: Secondary | ICD-10-CM | POA: Insufficient documentation

## 2022-02-25 LAB — I-STAT BETA HCG BLOOD, ED (MC, WL, AP ONLY): I-stat hCG, quantitative: <5 m[IU]/mL (ref <5)

## 2022-02-25 LAB — COMPREHENSIVE METABOLIC PANEL
ALT: 20 U/L (ref 0–44)
AST: 23 U/L (ref 15–41)
Albumin: 3.9 g/dL (ref 3.5–5.0)
Alkaline Phosphatase: 56 U/L (ref 38–126)
Anion gap: 9 (ref 5–15)
BUN: 13 mg/dL (ref 6–20)
CO2: 25 mmol/L (ref 22–32)
Calcium: 8.6 mg/dL — ABNORMAL LOW (ref 8.9–10.3)
Chloride: 104 mmol/L (ref 98–111)
Creatinine, Ser: 0.99 mg/dL (ref 0.44–1.00)
GFR, Estimated: >60 mL/min (ref >60)
Glucose, Bld: 96 mg/dL (ref 70–99)
Potassium: 3.1 mmol/L — ABNORMAL LOW (ref 3.5–5.1)
Sodium: 138 mmol/L (ref 135–145)
Total Bilirubin: 0.5 mg/dL (ref 0.3–1.2)
Total Protein: 8 g/dL (ref 6.5–8.1)

## 2022-02-25 LAB — URINALYSIS, ROUTINE W REFLEX MICROSCOPIC
Bacteria, UA: NONE SEEN
Bilirubin Urine: NEGATIVE
Glucose, UA: NEGATIVE mg/dL
Ketones, ur: NEGATIVE mg/dL
Leukocytes,Ua: NEGATIVE
Nitrite: NEGATIVE
Protein, ur: 30 mg/dL — AB
Specific Gravity, Urine: 1.026 (ref 1.005–1.030)
pH: 5 (ref 5.0–8.0)

## 2022-02-25 LAB — CBC
HCT: 40.3 % (ref 36.0–46.0)
Hemoglobin: 13.6 g/dL (ref 12.0–15.0)
MCH: 28.5 pg (ref 26.0–34.0)
MCHC: 33.7 g/dL (ref 30.0–36.0)
MCV: 84.5 fL (ref 80.0–100.0)
Platelets: 194 10*3/uL (ref 150–400)
RBC: 4.77 MIL/uL (ref 3.87–5.11)
RDW: 13.8 % (ref 11.5–15.5)
WBC: 7.1 10*3/uL (ref 4.0–10.5)
nRBC: 0 % (ref 0.0–0.2)

## 2022-02-25 LAB — LIPASE, BLOOD: Lipase: 23 U/L (ref 11–51)

## 2022-02-25 LAB — RESP PANEL BY RT-PCR (FLU A&B, COVID) ARPGX2
Influenza A by PCR: POSITIVE — AB
Influenza B by PCR: NEGATIVE
SARS Coronavirus 2 by RT PCR: NEGATIVE

## 2022-02-25 MED ORDER — FENTANYL CITRATE PF 50 MCG/ML IJ SOSY
50.0000 ug | PREFILLED_SYRINGE | Freq: Once | INTRAMUSCULAR | Status: AC
  Administered 2022-02-25: 50 ug via INTRAVENOUS
  Filled 2022-02-25: qty 1

## 2022-02-25 MED ORDER — SODIUM CHLORIDE (PF) 0.9 % IJ SOLN
INTRAMUSCULAR | Status: AC
  Filled 2022-02-25: qty 50

## 2022-02-25 MED ORDER — OSELTAMIVIR PHOSPHATE 75 MG PO CAPS: 75.0000 mg | ORAL_CAPSULE | Freq: Two times a day (BID) | ORAL | 0 refills | Status: DC

## 2022-02-25 MED ORDER — IOHEXOL 300 MG/ML  SOLN
100.0000 mL | Freq: Once | INTRAMUSCULAR | Status: AC | PRN
  Administered 2022-02-25: 100 mL via INTRAVENOUS

## 2022-02-25 MED ORDER — ACETAMINOPHEN 325 MG PO TABS
650.0000 mg | ORAL_TABLET | Freq: Once | ORAL | Status: AC
Start: 2022-02-25 — End: 2022-02-25
  Administered 2022-02-25: 650 mg via ORAL
  Filled 2022-02-25: qty 2

## 2022-02-25 MED ORDER — METOCLOPRAMIDE HCL 5 MG/ML IJ SOLN
10.0000 mg | Freq: Once | INTRAMUSCULAR | Status: AC
  Administered 2022-02-25: 10 mg via INTRAVENOUS
  Filled 2022-02-25: qty 2

## 2022-02-25 MED ORDER — AMOXICILLIN-POT CLAVULANATE 875-125 MG PO TABS
1.0000 | ORAL_TABLET | Freq: Two times a day (BID) | ORAL | 0 refills | Status: DC
Start: 2022-02-25 — End: 2022-07-03

## 2022-02-25 MED ORDER — ONDANSETRON 4 MG PO TBDP: 4.0000 mg | ORAL_TABLET | Freq: Three times a day (TID) | ORAL | 0 refills | Status: DC | PRN

## 2022-02-25 NOTE — Discharge Instructions (Addendum)
I suspect her symptoms are related to the influenza.  Take Tamiflu for this.  The radiologist also said you may have some slight diverticulitis again.  Recommend taking the antibiotic for this finding.  Take Zofran as needed for nausea.  Come back to ER if you are having any difficulty in breathing chest pain, fevers, worsening abdominal pain or vomiting or other new concerning symptom.

## 2022-02-25 NOTE — ED Triage Notes (Signed)
Pt c/o abdominal pain, nausea, vomiting, and diarrhea since Friday. Pt also reports increased urinary frequency.

## 2022-02-25 NOTE — ED Provider Notes (Signed)
[facility] DEPT Provider Note   CSN: [id number] Arrival date & time: 02/25/22  1210     History  Chief Complaint  Patient presents with   Abdominal Pain    Christina Cannon is a 56 y.o. female.  Presenting to ER for multiple different complaints.  She says since Friday she has been dealing with some abdominal pain nausea, fatigue, body aches.  No chest pain, no difficulty in breathing.  Pain in her abdomen is primarily in her upper abdomen.  No blood in stool or blood in the vomit.  No fever but has had some chills.  Took a COVID test but this was negative at home.  HPI     Home Medications Prior to Admission medications   Medication Sig Start Date End Date Taking? Authorizing Provider  amoxicillin-clavulanate (AUGMENTIN) 875-125 MG tablet Take 1 tablet by mouth every 12 (twelve) hours. 02/25/22  Yes Lucrezia Starch, MD  aspirin-sod bicarb-citric acid (ALKA-SELTZER) 325 MG TBEF tablet Take 325 mg by mouth every 6 (six) hours as needed (cold symptoms).   Yes [provider]  ibuprofen (ADVIL) 200 MG tablet Take 200 mg by mouth every 6 (six) hours as needed for moderate pain.   Yes [provider]  ondansetron (ZOFRAN-ODT) 4 MG disintegrating tablet Take 1 tablet (4 mg total) by mouth every 8 (eight) hours as needed for nausea or vomiting. 02/25/22  Yes Lucrezia Starch, MD  oseltamivir (TAMIFLU) 75 MG capsule Take 1 capsule (75 mg total) by mouth every 12 (twelve) hours. 02/25/22  Yes Damyiah Moxley, Ellwood Dense, MD  Pseudoeph-Doxylamine-DM-APAP (NYQUIL PO) Take 30 mLs by mouth at bedtime as needed (cold symptoms).   Yes [provider]      Allergies    Ceftin [cefuroxime axetil]    Review of Systems   Review of Systems  Constitutional:  Positive for chills and fatigue. Negative for fever.  HENT:  Negative for ear pain and sore throat.   Eyes:  Negative for pain and visual disturbance.  Respiratory:  Negative for cough and  shortness of breath.   Cardiovascular:  Negative for chest pain and palpitations.  Gastrointestinal:  Positive for abdominal pain and nausea. Negative for vomiting.  Genitourinary:  Negative for dysuria and hematuria.  Musculoskeletal:  Positive for arthralgias and myalgias. Negative for back pain.  Skin:  Negative for color change and rash.  Neurological:  Negative for seizures and syncope.  All other systems reviewed and are negative.   Physical Exam Updated Vital Signs BP 128/72   Pulse 67   Temp 99.2 F (37.3 C) (Oral)   Resp 14   Ht '5\' 7"'$  (1.702 m)   Wt 89.8 kg   SpO2 98%   BMI 31.01 kg/m  Physical Exam Vitals and nursing note reviewed.  Constitutional:      General: She is not in acute distress.    Appearance: She is well-developed.  HENT:     Head: Normocephalic and atraumatic.  Eyes:     Conjunctiva/sclera: Conjunctivae normal.  Cardiovascular:     Rate and Rhythm: Normal rate and regular rhythm.     Heart sounds: No murmur heard. Pulmonary:     Effort: Pulmonary effort is normal. No respiratory distress.     Breath sounds: Normal breath sounds.  Abdominal:     Palpations: Abdomen is soft.     Tenderness: There is abdominal tenderness in the epigastric area and left upper quadrant.  Musculoskeletal:  General: No swelling.     Cervical back: Neck supple.  Skin:    General: Skin is warm and dry.     Capillary Refill: Capillary refill takes less than 2 seconds.  Neurological:     Mental Status: She is alert.  Psychiatric:        Mood and Affect: Mood normal.     ED Results / Procedures / Treatments   Labs (all labs ordered are listed, but only abnormal results are displayed) Labs Reviewed  RESP PANEL BY RT-PCR (FLU A&B, COVID) ARPGX2 - Abnormal; Notable for the following components:      Result Value   Influenza A by PCR POSITIVE (*)    All other components within normal limits  COMPREHENSIVE METABOLIC PANEL - Abnormal; Notable for the following  components:   Potassium 3.1 (*)    Calcium 8.6 (*)    All other components within normal limits  URINALYSIS, ROUTINE W REFLEX MICROSCOPIC - Abnormal; Notable for the following components:   Hgb urine dipstick LARGE (*)    Protein, ur 30 (*)    All other components within normal limits  LIPASE, BLOOD  CBC  I-STAT BETA HCG BLOOD, ED ([facility], [facility], [facility] ONLY)    EKG None  Radiology CT ABDOMEN PELVIS W CONTRAST  Result Date: 02/25/2022 CLINICAL DATA:  Acute nonlocalized abdominal pain. EXAM: CT ABDOMEN AND PELVIS WITH CONTRAST TECHNIQUE: Multidetector CT imaging of the abdomen and pelvis was performed using the standard protocol following bolus administration of intravenous contrast. RADIATION DOSE REDUCTION: This exam was performed according to the departmental dose-optimization program which includes automated exposure control, adjustment of the mA and/or kV according to patient size and/or use of iterative reconstruction technique. CONTRAST:  175m OMNIPAQUE IOHEXOL 300 MG/ML  SOLN COMPARISON:  12/20/2021 FINDINGS: Lower chest: Unremarkable. Hepatobiliary: The liver shows diffusely decreased attenuation suggesting fat deposition. Gallbladder is surgically absent. No intrahepatic or extrahepatic biliary dilation. Pancreas: No focal mass lesion. No dilatation of the main duct. No intraparenchymal cyst. No peripancreatic edema. Spleen: No splenomegaly. No focal mass lesion. Adrenals/Urinary Tract: No adrenal nodule or mass. Kidneys unremarkable. No evidence for hydroureter. The urinary bladder appears normal for the degree of distention. Stomach/Bowel: Stomach is unremarkable. No gastric wall thickening. No evidence of outlet obstruction. Duodenum is normally positioned as is the ligament of Treitz. Duodenal diverticulum noted. No small bowel wall thickening. No small bowel dilatation. The terminal ileum is normal. The appendix is normal. No gross colonic mass. No colonic wall thickening. Diverticular changes  are noted in the left colon. Subtle stranding along the sigmoid segment is in the region of inflammation seen previously. This may reflect scarring, but recurrent diverticulitis cannot be completely excluded. Vascular/Lymphatic: There is mild atherosclerotic calcification of the abdominal aorta without aneurysm. There is no gastrohepatic or hepatoduodenal ligament lymphadenopathy. No retroperitoneal or mesenteric lymphadenopathy. No pelvic sidewall lymphadenopathy. Reproductive: The uterus is unremarkable.  There is no adnexal mass. Other: No intraperitoneal free fluid. Musculoskeletal: No worrisome lytic or sclerotic osseous abnormality. Small umbilical hernia contains only fat IMPRESSION: 1. Subtle stranding in the pericolonic fat adjacent to the sigmoid colon, in the region of inflammation seen previously. This may reflect post inflammatory scarring, but recurrent diverticulitis is not excluded. No evidence for perforation or abscess. 2. Hepatic steatosis. 3.  Aortic Atherosclerois (ICD10-170.0) Electronically Signed   By: EMisty StanleyM.D.   On: 02/25/2022 16:28   CT Head Wo Contrast  Result Date: 02/25/2022 CLINICAL DATA:  Sudden severe headache. EXAM: CT  HEAD WITHOUT CONTRAST TECHNIQUE: Contiguous axial images were obtained from the base of the skull through the vertex without intravenous contrast. RADIATION DOSE REDUCTION: This exam was performed according to the departmental dose-optimization program which includes automated exposure control, adjustment of the mA and/or kV according to patient size and/or use of iterative reconstruction technique. COMPARISON:  September 19, 2015 FINDINGS: Brain: No evidence of acute infarction, hemorrhage, hydrocephalus, extra-axial collection or mass lesion/mass effect. Vascular: No hyperdense vessel or unexpected calcification. Skull: Normal. Negative for fracture or focal lesion. Sinuses/Orbits: No acute finding. Other: None. IMPRESSION: Normal CT exam of the head.  Electronically Signed   By: Fidela Salisbury M.D.   On: 02/25/2022 16:12   DG Chest 2 View  Result Date: 02/25/2022 CLINICAL DATA:  Shortness of breath and abdominal pain. EXAM: CHEST - 2 VIEW COMPARISON:  01/14/2017 FINDINGS: The lungs are clear without focal pneumonia, edema, pneumothorax or pleural effusion. The cardiopericardial silhouette is within normal limits for size. The visualized bony structures of the thorax are unremarkable. IMPRESSION: No active cardiopulmonary disease. Electronically Signed   By: Misty Stanley M.D.   On: 02/25/2022 13:22    Procedures Procedures    Medications Ordered in ED Medications  acetaminophen (TYLENOL) tablet 650 mg (650 mg Oral Given 02/25/22 1500)  metoCLOPramide (REGLAN) injection 10 mg (10 mg Intravenous Given 02/25/22 1509)  fentaNYL (SUBLIMAZE) injection 50 mcg (50 mcg Intravenous Given 02/25/22 1509)  iohexol (OMNIPAQUE) 300 MG/ML solution 100 mL (100 mLs Intravenous Contrast Given 02/25/22 1546)    ED Course/ Medical Decision Making/ A&P                           Medical Decision Making Amount and/or Complexity of Data Reviewed Radiology: ordered.  Risk Prescription drug management.   56 year old lady presenting to ER for abdominal pain nausea, malaise, fatigue, chills.  On exam well-appearing, no distress.  Did note some tenderness in her abdomen.  Noted admission a couple months ago for diverticulitis.  Check CT scan.  I independently reviewed and interpreted results.  Reviewed radiology report, likely some chronic scarring but could not exclude recurrent diverticulitis.  Her flu test is positive.  Based on the location of her pain and her associated symptoms I suspect most likely her symptoms today are related from acute influenza infection and less likely from diverticulitis.  Nevertheless out of an abundance of precaution given her history of recurrent diverticulitis will give her a course of Augmentin to cover this possibility.  For the  influenza will give her course of Tamiflu.  On reassessment, she is continuing to be well-appearing in no distress.  Her symptoms are well-controlled.  Tolerating p.o. without difficulty, vital signs within normal limits.  Feel she is appropriate for discharge and outpatient management at this time and does not require admission.  I did stressed need for close outpatient follow-up with her primary doctor.  After the discussed management above, the patient was determined to be safe for discharge.  The patient was in agreement with this plan and all questions regarding their care were answered.  ED return precautions were discussed and the patient will return to the ED with any significant worsening of condition.        Final Clinical Impression(s) / ED Diagnoses Final diagnoses:  Influenza    Rx / DC Orders ED Discharge Orders          Ordered    amoxicillin-clavulanate (AUGMENTIN) 875-125 MG tablet  Every 12 hours        02/25/22 1830    oseltamivir (TAMIFLU) 75 MG capsule  Every 12 hours        02/25/22 1830    ondansetron (ZOFRAN-ODT) 4 MG disintegrating tablet  Every 8 hours PRN        02/25/22 1830              Lucrezia Starch, MD 02/26/22 1507

## 2022-02-25 NOTE — ED Provider Triage Note (Signed)
Emergency Medicine Provider Triage Evaluation Note  Christina Cannon , a 56 y.o. female  was evaluated in triage.  Pt complains of abdominal pain, nausea, vomiting, since Friday. Also endorses general malaise, cough, shortness of breath. Denies chest pain at rest. Hx of chronic diverticulitis. Reports this feels different. Negative covid test at home earlier this morning. Reports urinary and fecal incontinence due to urgency / diarrhea. No saddle anesthesia.  Review of Systems  Positive: Nvd, abdominal pain, shob Negative: Chest pain  Physical Exam  BP (!) 142/86   Pulse 73   Temp 100.3 F (37.9 C) (Oral)   Resp 19   Ht '5\' 7"'$  (1.702 m)   Wt 89.8 kg   SpO2 97%   BMI 31.01 kg/m  Gen:   Awake, uncomfortable, somewhat ill appearing Resp:  Normal effort, dry cough MSK:   Moves extremities without difficulty  Other:    Medical Decision Making  Medically screening exam initiated at 12:31 PM.  Appropriate orders placed.  Christina Cannon was informed that the remainder of the evaluation will be completed by another provider, this initial triage assessment does not replace that evaluation, and the importance of remaining in the ED until their evaluation is complete.  Workup initiated   Anselmo Pickler, [place] 02/25/22 1233

## 2022-02-25 NOTE — ED Notes (Signed)
Patient transported to CT 

## 2022-03-02 ENCOUNTER — Ambulatory Visit: Payer: Commercial Managed Care - HMO | Attending: Internal Medicine | Admitting: Internal Medicine

## 2022-03-02 ENCOUNTER — Encounter: Payer: Self-pay | Admitting: Internal Medicine

## 2022-03-02 ENCOUNTER — Other Ambulatory Visit (HOSPITAL_COMMUNITY)
Admission: RE | Admit: 2022-03-02 | Discharge: 2022-03-02 | Disposition: A | Discharge status: Home / Self Care | Payer: Commercial Managed Care - HMO | Source: Ambulatory Visit | Attending: Internal Medicine | Admitting: Internal Medicine

## 2022-03-02 VITALS — BP 115/80 | HR 76 | Temp 98.6°F | Ht 67.0 in | Wt 216.8 lb

## 2022-03-02 DIAGNOSIS — Z124 Encounter for screening for malignant neoplasm of cervix: Secondary | ICD-10-CM | POA: Insufficient documentation

## 2022-03-02 DIAGNOSIS — Z23 Encounter for immunization: Secondary | ICD-10-CM

## 2022-03-02 DIAGNOSIS — Z1211 Encounter for screening for malignant neoplasm of colon: Secondary | ICD-10-CM

## 2022-03-02 DIAGNOSIS — F320 Major depressive disorder, single episode, mild: Secondary | ICD-10-CM | POA: Diagnosis not present

## 2022-03-02 NOTE — Patient Instructions (Addendum)
This is the phone number for you to call [facility] to schedule colonoscopy for colon cancer screening and for follow-up for history of diverticulosis.  Let them know that we have submitted the referral since the end of June.   [facility] [address] [place], [place] [address] PH# [phone]

## 2022-03-02 NOTE — Progress Notes (Signed)
Patient ID: Christina Cannon, female    DOB: 1966-05-07  MRN: [id number]  CC: Gynecologic Exam   Subjective: Christina Cannon is a 56 y.o. female who presents for pap Her concerns today include:  Pt with hx of tob dep, diverticulosis, obese, OA knees/ankles, diverticulitis  GYN History:  Pt is G2P0 (miscarriages) Any hx of abn paps?: no Menses regular or irregular?:  menopausal How long does menses last?  NA Menstrual flow light or heavy?:  NA Method of birth control?:  NA Any vaginal dischg at this time?:  no Dysuria?:  no Any hx of STI?: no Sexually active with how many partners:  not sexually active in the past 2 yrs Desires STI screen:  no Last MMG: 01/2022 Family hx of uterine, cervical or breast cancer?:  paternal GM had breast CA  Pos dep and GAD7: dep about being sick all the time at her age.  Also use to work 2 jobs but recently cut from one of them last wk.  Worried about paying bills. Has good support from friends/family  HM: due for c-scope.  Referred GI last visit.  Due for shingles vaccine  Patient Active Problem List   Diagnosis Date Noted   Prolonged QT interval 12/21/2021   Colitis 12/21/2021   Diverticulitis 12/20/2021   Hypokalemia 12/20/2021   Obesity (BMI 30-39.9) 12/20/2021   Tobacco use 12/20/2021   History of colonic diverticulitis 07/09/2017   Primary osteoarthritis of both knees 07/09/2017   Primary osteoarthritis of both ankles 07/09/2017   Tobacco dependence 07/09/2017   Callous ulcer, limited to breakdown of skin ([place]) 07/09/2017   Elevated blood pressure reading 07/09/2017   Leukocytosis 06/10/2017   Skin tag of vulva 08/10/2015     Current Outpatient Medications on File Prior to Visit  Medication Sig Dispense Refill   amoxicillin-clavulanate (AUGMENTIN) 875-125 MG tablet Take 1 tablet by mouth every 12 (twelve) hours. 14 tablet 0   ibuprofen (ADVIL) 200 MG tablet Take 200 mg by mouth every 6 (six) hours as needed for moderate  pain.     ondansetron (ZOFRAN-ODT) 4 MG disintegrating tablet Take 1 tablet (4 mg total) by mouth every 8 (eight) hours as needed for nausea or vomiting. 20 tablet 0   aspirin-sod bicarb-citric acid (ALKA-SELTZER) 325 MG TBEF tablet Take 325 mg by mouth every 6 (six) hours as needed (cold symptoms). (Patient not taking: Reported on 03/02/2022)     oseltamivir (TAMIFLU) 75 MG capsule Take 1 capsule (75 mg total) by mouth every 12 (twelve) hours. (Patient not taking: Reported on 03/02/2022) 10 capsule 0   Pseudoeph-Doxylamine-DM-APAP (NYQUIL PO) Take 30 mLs by mouth at bedtime as needed (cold symptoms). (Patient not taking: Reported on 03/02/2022)     No current facility-administered medications on file prior to visit.    Allergies  Allergen Reactions   Ceftin [Cefuroxime Axetil] Anaphylaxis and Swelling    Throat and face swell. Pt stated she had a regimen in the hospital recently(2018) and did just fine.    Social History   Socioeconomic History   Marital status: Widowed    Spouse name: Not on file   Number of children: Not on file   Years of education: Not on file   Highest education level: Not on file  Occupational History   Not on file  Tobacco Use   Smoking status: Former    Packs/day: 0.50    Types: Cigarettes   Smokeless tobacco: Never  Vaping Use   Vaping Use: Never used  Substance and Sexual Activity   Alcohol use: No   Drug use: No    Types: Marijuana    Comment: Prior use   Sexual activity: Yes    Birth control/protection: None  Other Topics Concern   Not on file  Social History Narrative   Not on file   Social Determinants of Health   Financial Resource Strain: Not on file  Food Insecurity: Not on file  Transportation Needs: Not on file  Physical Activity: Insufficiently Active (09/05/2017)   Exercise Vital Sign    Days of Exercise per Week: 2 days    Minutes of Exercise per Session: 10 min  Stress: No Stress Concern Present (09/05/2017)   [place]    Feeling of Stress : Not at all  Social Connections: Somewhat Isolated (09/05/2017)   Social Connection and Isolation Panel [NHANES]    Frequency of Communication with Friends and Family: Twice a week    Frequency of Social Gatherings with Friends and Family: Twice a week    Attends Religious Services: More than 4 times per year    Active Member of [employer] or Organizations: No    Attends Archivist Meetings: Never    Marital Status: Widowed  Intimate Partner Violence: Not At Risk (09/05/2017)   Humiliation, Afraid, Rape, and Kick questionnaire    Fear of Current or Ex-Partner: No    Emotionally Abused: No    Physically Abused: No    Sexually Abused: No    Family History  Problem Relation Age of Onset   Diabetes Mother    Hypertension Mother    Diabetes Father    Hypertension Father    Heart failure Father     Past Surgical History:  Procedure Laterality Date   CHOLECYSTECTOMY     HAND SURGERY      ROS: Review of Systems Negative except as stated above  PHYSICAL EXAM: BP 115/80   Pulse 76   Temp 98.6 F (37 C) (Oral)   Ht '5\' 7"'$  (1.702 m)   Wt 216 lb 12.8 oz (98.3 kg)   SpO2 95%   BMI 33.96 kg/m   Physical Exam  General appearance - alert, well appearing, and in no distress Mental status - normal mood, behavior, speech, dress, motor activity, and thought processes Pelvic -CMA Oretha Milch present: Patient with hyperpigmentation of the skin of the genital and vaginal area.  Vaginal mucosa is dry and somewhat atrophic consistent with postmenopausal woman.  Cervical os is stenosed.  Cervix appears normal.  No cervical motion tenderness or adnexal masses.     03/02/2022   10:20 AM 10/08/2017   11:32 AM 07/09/2017    1:49 PM  Depression screen PHQ 2/9  Decreased Interest 2 0 0  Down, Depressed, Hopeless 2 1 0  PHQ - 2 Score 4 1 0  Altered sleeping 3    Tired, decreased energy 3     Change in appetite 3    Feeling bad or failure about yourself  0    Trouble concentrating 0    Moving slowly or fidgety/restless 0    Suicidal thoughts 0    PHQ-9 Score 13        03/02/2022   10:21 AM 10/08/2017   11:32 AM 07/09/2017    1:50 PM  GAD 7 : Generalized Anxiety Score  Nervous, Anxious, on Edge 1 0 0  Control/stop worrying '3 1 1  '$ Worry too much - different things  $'3 1 1  'K$ Trouble relaxing '3 1 1  '$ Restless 2 1 0  Easily annoyed or irritable '3 1 1  '$ Afraid - awful might happen 0 0 0  Total GAD 7 Score '15 5 4        '$ Latest Ref Rng & Units 02/25/2022    2:21 PM 12/24/2021    3:34 AM 12/23/2021    2:28 AM  CMP  Glucose 70 - 99 mg/dL 96  89  94   BUN 6 - 20 mg/dL '13  11  11   '$ Creatinine 0.44 - 1.00 mg/dL 0.99  0.98  1.03   Sodium 135 - 145 mmol/L 138  138  141   Potassium 3.5 - 5.1 mmol/L 3.1  3.6  4.0   Chloride 98 - 111 mmol/L 104  110  112   CO2 22 - 32 mmol/L '25  22  21   '$ Calcium 8.9 - 10.3 mg/dL 8.6  8.7  8.8   Total Protein 6.5 - 8.1 g/dL 8.0     Total Bilirubin 0.3 - 1.2 mg/dL 0.5     Alkaline Phos 38 - 126 U/L 56     AST 15 - 41 U/L 23     ALT 0 - 44 U/L 20      Lipid Panel  No results found for: "CHOL", "TRIG", "HDL", "CHOLHDL", "VLDL", "LDLCALC", "LDLDIRECT"  CBC    Component Value Date/Time   WBC 7.1 02/25/2022 1421   RBC 4.77 02/25/2022 1421   HGB 13.6 02/25/2022 1421   HCT 40.3 02/25/2022 1421   PLT 194 02/25/2022 1421   MCV 84.5 02/25/2022 1421   MCH 28.5 02/25/2022 1421   MCHC 33.7 02/25/2022 1421   RDW 13.8 02/25/2022 1421   LYMPHSABS 2.6 12/24/2021 0334   MONOABS 0.7 12/24/2021 0334   EOSABS 0.1 12/24/2021 0334   BASOSABS 0.0 12/24/2021 0334    ASSESSMENT AND PLAN: 1. Pap smear for cervical cancer screening - Cytology - PAP  2. Major depressive disorder, single episode, mild ([place]) Patient with positive depression screen and symptoms of mild depression.  We discussed management and whether she feels she would benefit from counseling  and/or medication.  Patient declines both at this time.  She states that she prays about her problems.  She also endorses good support from family and friends.  3. Screening for colon cancer Referred to  GI on last visit for follow-up of diverticulosis and will need colonoscopy for colon cancer screening as well.  I gave her the phone number to call them to get the appointment.  4. Need for shingles vaccine Discussed recommendation for shingles vaccine.  Patient agreeable to starting the vaccine series.  Given first Shingrix shot today.     Patient was given the opportunity to ask questions.  Patient verbalized understanding of the plan and was able to repeat key elements of the plan.   This documentation was completed using Radio producer.  Any transcriptional errors are unintentional.  No orders of the defined types were placed in this encounter.    Requested Prescriptions    No prescriptions requested or ordered in this encounter    Return in about 4 months (around 07/02/2022).  Karle Plumber, MD, FACP

## 2022-03-05 ENCOUNTER — Encounter: Payer: Self-pay | Admitting: Gastroenterology

## 2022-03-05 LAB — CYTOLOGY - PAP
Adequacy: ABSENT
Comment: NEGATIVE
Diagnosis: NEGATIVE
High risk HPV: NEGATIVE

## 2022-03-05 NOTE — Progress Notes (Signed)
Let pt know pap smear was normal

## 2022-03-09 NOTE — Progress Notes (Signed)
LVM for pt to RTC. Letter sent. ----DD,RMA

## 2022-03-16 ENCOUNTER — Ambulatory Visit (AMBULATORY_SURGERY_CENTER): Payer: Commercial Managed Care - HMO

## 2022-03-16 VITALS — Ht 67.0 in | Wt 224.0 lb

## 2022-03-16 DIAGNOSIS — Z1211 Encounter for screening for malignant neoplasm of colon: Secondary | ICD-10-CM

## 2022-03-16 MED ORDER — NA SULFATE-K SULFATE-MG SULF 17.5-3.13-1.6 GM/177ML PO SOLN: 1.0000 | Freq: Once | ORAL | 0 refills | Status: AC

## 2022-03-16 NOTE — Progress Notes (Signed)
No egg or soy allergy known to patient  No issues known to pt with past sedation with any surgeries or procedures Patient denies ever being told they had issues or difficulty with intubation  No FH of Malignant Hyperthermia Pt is not on diet pills Pt is not on  home 02  Pt is not on blood thinners  Pt denies issues with constipation  No A fib or A flutter Have any cardiac testing pending--no Pt instructed to use Singlecare.[email or web address] or GoodRx for a price reduction on prep   

## 2022-04-16 ENCOUNTER — Encounter: Payer: Commercial Managed Care - HMO | Admitting: Gastroenterology

## 2022-04-24 ENCOUNTER — Encounter: Payer: Commercial Managed Care - HMO | Admitting: Gastroenterology

## 2022-05-07 ENCOUNTER — Encounter: Payer: Self-pay | Admitting: Gastroenterology

## 2022-05-10 ENCOUNTER — Encounter: Payer: Self-pay | Admitting: Gastroenterology

## 2022-05-10 ENCOUNTER — Ambulatory Visit (AMBULATORY_SURGERY_CENTER): Payer: Commercial Managed Care - HMO | Admitting: Gastroenterology

## 2022-05-10 VITALS — BP 146/92 | HR 62 | Temp 97.5°F | Resp 22 | Ht 67.0 in | Wt 224.0 lb

## 2022-05-10 DIAGNOSIS — D125 Benign neoplasm of sigmoid colon: Secondary | ICD-10-CM | POA: Diagnosis not present

## 2022-05-10 DIAGNOSIS — D122 Benign neoplasm of ascending colon: Secondary | ICD-10-CM | POA: Diagnosis not present

## 2022-05-10 DIAGNOSIS — D124 Benign neoplasm of descending colon: Secondary | ICD-10-CM

## 2022-05-10 DIAGNOSIS — Z1211 Encounter for screening for malignant neoplasm of colon: Secondary | ICD-10-CM

## 2022-05-10 DIAGNOSIS — D123 Benign neoplasm of transverse colon: Secondary | ICD-10-CM | POA: Diagnosis not present

## 2022-05-10 MED ORDER — SODIUM CHLORIDE 0.9 % IV SOLN: 500.0000 mL | Freq: Once | INTRAVENOUS | Status: DC

## 2022-05-10 NOTE — Patient Instructions (Signed)
Please read handouts provided. Continue present medications. Await pathology results.   YOU HAD AN ENDOSCOPIC PROCEDURE TODAY AT THE [facility]:   Refer to the procedure report that was given to you for any specific questions about what was found during the examination.  If the procedure report does not answer your questions, please call your gastroenterologist to clarify.  If you requested that your care partner not be given the details of your procedure findings, then the procedure report has been included in a sealed envelope for you to review at your convenience later.  YOU SHOULD EXPECT: Some feelings of bloating in the abdomen. Passage of more gas than usual.  Walking can help get rid of the air that was put into your GI tract during the procedure and reduce the bloating. If you had a lower endoscopy (such as a colonoscopy or flexible sigmoidoscopy) you may notice spotting of blood in your stool or on the toilet paper. If you underwent a bowel prep for your procedure, you may not have a normal bowel movement for a few days.  Please Note:  You might notice some irritation and congestion in your nose or some drainage.  This is from the oxygen used during your procedure.  There is no need for concern and it should clear up in a day or so.  SYMPTOMS TO REPORT IMMEDIATELY:  Following lower endoscopy (colonoscopy or flexible sigmoidoscopy):  Excessive amounts of blood in the stool  Significant tenderness or worsening of abdominal pains  Swelling of the abdomen that is new, acute  Fever of 100F or higher  For urgent or emergent issues, a gastroenterologist can be reached at any hour by calling [phone]. Do not use MyChart messaging for urgent concerns.    DIET:  We do recommend a small meal at first, but then you may proceed to your regular diet.  Drink plenty of fluids but you should avoid alcoholic beverages for 24 hours.  ACTIVITY:  You should plan to take it easy for  the rest of today and you should NOT DRIVE or use heavy machinery until tomorrow (because of the sedation medicines used during the test).    FOLLOW UP: Our staff will call the number listed on your records the next business day following your procedure.  We will call around 7:15- 8:00 am to check on you and address any questions or concerns that you may have regarding the information given to you following your procedure. If we do not reach you, we will leave a message.     If any biopsies were taken you will be contacted by phone or by letter within the next 1-3 weeks.  Please call us at [phone] if you have not heard about the biopsies in 3 weeks.    SIGNATURES/CONFIDENTIALITY: You and/or your care partner have signed paperwork which will be entered into your electronic medical record.  These signatures attest to the fact that that the information above on your After Visit Summary has been reviewed and is understood.  Full responsibility of the confidentiality of this discharge information lies with you and/or your care-partner. 

## 2022-05-10 NOTE — Progress Notes (Signed)
[facility] History and Physical   Primary Care Physician:  Ladell Pier, MD   Reason for Procedure:   Colon cancer screening  Plan:    Screening colonoscopy     HPI: Christina Cannon is a 56 y.o. female undergoing initial average risk screening colonoscopy.  She has no family history of colon cancer.  She has a history of recurrent uncomplicated sigmoid diverticulitis in February and May of this year.  She continues to struggle with abdominal discomfort.   Past Medical History:  Diagnosis Date   Diverticulitis     Past Surgical History:  Procedure Laterality Date   CHOLECYSTECTOMY  1988   HAND SURGERY  1997    Prior to Admission medications   Medication Sig Start Date End Date Taking? Authorizing Provider  amoxicillin-clavulanate (AUGMENTIN) 875-125 MG tablet Take 1 tablet by mouth every 12 (twelve) hours. 02/25/22   Lucrezia Starch, MD  aspirin-sod bicarb-citric acid (ALKA-SELTZER) 325 MG TBEF tablet Take 325 mg by mouth every 6 (six) hours as needed (cold symptoms).    [provider]  ibuprofen (ADVIL) 200 MG tablet Take 200 mg by mouth every 6 (six) hours as needed for moderate pain.    [provider]  ondansetron (ZOFRAN-ODT) 4 MG disintegrating tablet Take 1 tablet (4 mg total) by mouth every 8 (eight) hours as needed for nausea or vomiting. 02/25/22   Lucrezia Starch, MD  oseltamivir (TAMIFLU) 75 MG capsule Take 1 capsule (75 mg total) by mouth every 12 (twelve) hours. 02/25/22   Lucrezia Starch, MD  Pseudoeph-Doxylamine-DM-APAP (NYQUIL PO) Take 30 mLs by mouth at bedtime as needed (cold symptoms).    [provider]    Current Outpatient Medications  Medication Sig Dispense Refill   amoxicillin-clavulanate (AUGMENTIN) 875-125 MG tablet Take 1 tablet by mouth every 12 (twelve) hours. 14 tablet 0   aspirin-sod bicarb-citric acid (ALKA-SELTZER) 325 MG TBEF tablet Take 325 mg by mouth every 6 (six) hours as needed  (cold symptoms).     ibuprofen (ADVIL) 200 MG tablet Take 200 mg by mouth every 6 (six) hours as needed for moderate pain.     ondansetron (ZOFRAN-ODT) 4 MG disintegrating tablet Take 1 tablet (4 mg total) by mouth every 8 (eight) hours as needed for nausea or vomiting. 20 tablet 0   oseltamivir (TAMIFLU) 75 MG capsule Take 1 capsule (75 mg total) by mouth every 12 (twelve) hours. 10 capsule 0   Pseudoeph-Doxylamine-DM-APAP (NYQUIL PO) Take 30 mLs by mouth at bedtime as needed (cold symptoms).     Current Facility-Administered Medications  Medication Dose Route Frequency Provider Last Rate Last Admin   0.9 %  sodium chloride infusion  500 mL Intravenous Once Daryel November, MD        Allergies as of 05/10/2022 - Review Complete 05/10/2022  Allergen Reaction Noted   Ceftin [cefuroxime axetil] Anaphylaxis and Swelling 07/24/2012    Family History  Problem Relation Age of Onset   Diabetes Mother    Hypertension Mother    Diabetes Father    Hypertension Father    Heart failure Father    Colon cancer Neg Hx    Colon polyps Neg Hx    Esophageal cancer Neg Hx    Rectal cancer Neg Hx    Stomach cancer Neg Hx     Social History   Socioeconomic History   Marital status: Widowed    Spouse name: Not on file   Number of children: Not on file  Years of education: Not on file   Highest education level: Not on file  Occupational History   Not on file  Tobacco Use   Smoking status: Former    Packs/day: 0.25    Types: Cigarettes    Quit date: 11/2021    Years since quitting: 0.4   Smokeless tobacco: Never   Tobacco comments:    Former occasional smoker started around age 8,   Vaping Use   Vaping Use: Never used  Substance and Sexual Activity   Alcohol use: Never   Drug use: Not Currently    Types: Marijuana    Comment: Prior use   Sexual activity: Yes    Birth control/protection: None, Post-menopausal  Other Topics Concern   Not on file  Social History Narrative    Not on file   Social Determinants of Health   Financial Resource Strain: Not on file  Food Insecurity: Not on file  Transportation Needs: Not on file  Physical Activity: Insufficiently Active (09/05/2017)   Exercise Vital Sign    Days of Exercise per Week: 2 days    Minutes of Exercise per Session: 10 min  Stress: No Stress Concern Present (09/05/2017)   [place] of Stress : Not at all  Social Connections: Somewhat Isolated (09/05/2017)   Social Connection and Isolation Panel [NHANES]    Frequency of Communication with Friends and Family: Twice a week    Frequency of Social Gatherings with Friends and Family: Twice a week    Attends Religious Services: More than 4 times per year    Active Member of [employer] or Organizations: No    Attends Archivist Meetings: Never    Marital Status: Widowed  Intimate Partner Violence: Not At Risk (09/05/2017)   Humiliation, Afraid, Rape, and Kick questionnaire    Fear of Current or Ex-Partner: No    Emotionally Abused: No    Physically Abused: No    Sexually Abused: No    Review of Systems:  All other review of systems negative except as mentioned in the HPI.  Physical Exam: Vital signs BP (!) 137/90   Pulse 73   Temp (!) 97.5 F (36.4 C) (Temporal)   Ht '5\' 7"'$  (1.702 m)   Wt 224 lb (101.6 kg)   LMP 05/29/2019   SpO2 96%   BMI 35.08 kg/m   General:   Alert,  Well-developed, well-nourished, pleasant and cooperative in NAD Airway:  Mallampati 2 Lungs:  Clear throughout to auscultation.   Heart:  Regular rate and rhythm; no murmurs, clicks, rubs,  or gallops. Abdomen:  Soft, nontender and nondistended. Normal bowel sounds.   Neuro/Psych:  Normal mood and affect. A and O x 3   Tukker Byrns E. Candis Schatz, MD [facility]

## 2022-05-10 NOTE — Progress Notes (Signed)
PT taken to PACU. Monitors in place. VSS. Report given to RN. 

## 2022-05-10 NOTE — Progress Notes (Signed)
Called to room to assist during endoscopic procedure.  Patient ID and intended procedure confirmed with present staff. Received instructions for my participation in the procedure from the performing physician.  

## 2022-05-10 NOTE — Op Note (Addendum)
[facility] Patient Name: Christina Cannon Procedure Date: 05/10/2022 9:38 AM MRN: [id number] Endoscopist: [place]. Candis Schatz , MD Age: 56 Referring MD:  Date of Birth: 01-Feb-1966 Gender: Female Account #: [id number] Procedure:                Colonoscopy Indications:              Screening for colorectal malignant neoplasm, This                            is the patient's first colonoscopy Medicines:                Monitored Anesthesia Care Procedure:                Pre-Anesthesia Assessment:                           - Prior to the procedure, a History and Physical                            was performed, and patient medications and                            allergies were reviewed. The patient's tolerance of                            previous anesthesia was also reviewed. The risks                            and benefits of the procedure and the sedation                            options and risks were discussed with the patient.                            All questions were answered, and informed consent                            was obtained. Prior Anticoagulants: The patient has                            taken no previous anticoagulant or antiplatelet                            agents. ASA Grade Assessment: II - A patient with                            mild systemic disease. After reviewing the risks                            and benefits, the patient was deemed in                            satisfactory condition to undergo the procedure.  After obtaining informed consent, the colonoscope                            was passed under direct vision. Throughout the                            procedure, the patient's blood pressure, pulse, and                            oxygen saturations were monitored continuously. The                            CF [id number] #[id number] was introduced through the anus                            and advanced  to the the cecum, identified by                            appendiceal orifice and ileocecal valve. The                            colonoscopy was performed without difficulty. The                            patient tolerated the procedure well but required                            20 mg labetalol due to high blood pressure and                            required large amounts of sedation (1170 mg                            propofol and 5 mg versed). The quality of the bowel                            preparation was inadequate. The ileocecal valve,                            appendiceal orifice, and rectum were photographed. Scope In: 9:50:44 AM Scope Out: 11:06:27 AM Scope Withdrawal Time: 1 hour 10 minutes 26 seconds  Total Procedure Duration: 1 hour 15 minutes 43 seconds  Findings:                 The perianal and digital rectal examinations were                            normal. Pertinent negatives include normal                            sphincter tone and no palpable rectal lesions.                           A 25-30 mm polyp was  found in the descending colon.                            The polyp was pedunculated. The stalk of the polyp                            was injected with 48m of 1:100,000 solution of                            epinephrine to shrink the stalk and reduce risk of                            post-polypectomy hemorrhage. Polypectomy was                            attempted with a hot snare. Resection of the polyp                            was unsuccessful because the snare was not able to                            cut through the polyp. Multiple attempts were made                            to resect the polyp with pure cut and mixed                            coagulation settings, repositioning the snare in                            different sections of the polyp stalk, replacing                            the cautery pedal. Due to the long procedure  time,                            excessive sedation requirements and technical                            difficulty, the decision was made to defer further                            attempts at resection. Biopsies of the apex of the                            polyp were taken with a cold forceps for histology.                            Estimated blood loss was minimal.                           The ileocecal valve was lipomatous. Biopsies were  taken with a cold forceps for histology. Estimated                            blood loss was minimal.                           A 12 mm polyp was found in the distal transverse                            colon. The polyp was pedunculated. The polyp was                            removed with a hot snare. Resection and retrieval                            were complete. Estimated blood loss: none.                           Three sessile polyps were found in the descending                            colon, transverse colon and ascending colon. The                            polyps were 3 to 5 mm in size. These polyps were                            removed with a cold snare. Resection and retrieval                            were complete. Estimated blood loss was minimal.                           A medium-sized polypoid lesion was found in the                            sigmoid colon. The lesion was semi-pedunculated.                            Biopsies were taken with a cold forceps for                            histology. Estimated blood loss was minimal. It                            appears that these biopsies may have been                            erroneously placed in the same jar as the biopsies                            of the large descending colon polyp  Many small and large-mouthed diverticula were found                            in the sigmoid colon, descending colon, transverse                             colon and ascending colon. There was no evidence of                            diverticular bleeding.                           The exam was otherwise normal throughout the                            examined colon.                           The terminal ileum appeared normal.                           The retroflexed view of the distal rectum and anal                            verge was normal and showed no anal or rectal                            abnormalities. Complications:            No immediate complications. Estimated Blood Loss:     Estimated blood loss was minimal. Impression:               - Preparation of the colon was inadequate.                           - One 25-30 mm polyp in the descending colon.                            Unsuccessful polyp resection, possibley due to                            equipment malfunction. Injected. Biopsied.                           - Lipomatous, protuberant ileocecal valve. Biopsied.                           - One 12 mm polyp in the distal transverse colon,                            removed with a hot snare. Resected and retrieved.                           - Three 3 to 5 mm polyps in the descending colon,  in the transverse colon and in the ascending colon,                            removed with a cold snare. Resected and retrieved.                           - Likely benign erythematous polypoid lesion in the                            sigmoid colon. Biopsied. Suspect inflammatory polyp.                           - Moderate diverticulosis in the sigmoid colon, in                            the descending colon, in the transverse colon and                            in the ascending colon. There was no evidence of                            diverticular bleeding.                           - The examined portion of the ileum was normal.                           - The distal rectum  and anal verge are normal on                            retroflexion view. Recommendation:           - Patient has a contact number available for                            emergencies. The signs and symptoms of potential                            delayed complications were discussed with the                            patient. Return to normal activities tomorrow.                            Written discharge instructions were provided to the                            patient.                           - Resume previous diet.                           - Continue present medications.                           -  Await pathology results.                           - Repeat colonoscopy (date not yet determined) for                            surveillance based on pathology results. Dianah Pruett E. Candis Schatz, MD 05/10/2022 11:30:15 AM This report has been signed electronically.

## 2022-05-10 NOTE — Progress Notes (Signed)
VS completed by CW.   Pt's states no medical or surgical changes since previsit or office visit.  

## 2022-05-11 ENCOUNTER — Telehealth: Payer: Self-pay | Admitting: *Deleted

## 2022-05-11 NOTE — Telephone Encounter (Signed)
  Follow up Call-     05/10/2022    9:12 AM  Call back number  Post procedure Call Back phone  # [phone]  Permission to leave phone message Yes     Patient questions:  Do you have a fever, pain , or abdominal swelling? No. Pain Score  0 *  Have you tolerated food without any problems? Yes.    Have you been able to return to your normal activities? Yes.    Do you have any questions about your discharge instructions: Diet   No. Medications  No. Follow up visit  No.  Do you have questions or concerns about your Care? No.  Actions: * If pain score is 4 or above: No action needed, pain <4.

## 2022-05-27 ENCOUNTER — Encounter: Payer: Self-pay | Admitting: Gastroenterology

## 2022-05-28 ENCOUNTER — Other Ambulatory Visit: Payer: Self-pay

## 2022-05-28 DIAGNOSIS — K635 Polyp of colon: Secondary | ICD-10-CM

## 2022-05-29 ENCOUNTER — Telehealth: Payer: Self-pay

## 2022-05-29 NOTE — Telephone Encounter (Signed)
Attempted to contact patient but received a busy signal. I also called patients alternate contact and there was no answer. Calling to inform patient of procedure scheduled for 07/19/22 @ [facility].

## 2022-06-06 ENCOUNTER — Other Ambulatory Visit: Payer: Self-pay

## 2022-06-06 ENCOUNTER — Telehealth: Payer: Self-pay

## 2022-06-06 DIAGNOSIS — K635 Polyp of colon: Secondary | ICD-10-CM

## 2022-06-06 NOTE — Telephone Encounter (Signed)
Contacted patient and let her know that she is scheduled for a Colonoscopy 12/28 @ 9:45am at [facility] with [place]. Patient stated that she do that day and would like instructions mailed to her.

## 2022-07-03 ENCOUNTER — Ambulatory Visit: Payer: Commercial Managed Care - HMO | Attending: Internal Medicine | Admitting: Internal Medicine

## 2022-07-03 ENCOUNTER — Encounter: Payer: Self-pay | Admitting: Internal Medicine

## 2022-07-03 VITALS — BP 160/96 | HR 77 | Temp 98.1°F | Ht 67.0 in | Wt 224.0 lb

## 2022-07-03 DIAGNOSIS — M19072 Primary osteoarthritis, left ankle and foot: Secondary | ICD-10-CM

## 2022-07-03 DIAGNOSIS — R200 Anesthesia of skin: Secondary | ICD-10-CM | POA: Diagnosis not present

## 2022-07-03 DIAGNOSIS — Z6835 Body mass index (BMI) 35.0-35.9, adult: Secondary | ICD-10-CM

## 2022-07-03 DIAGNOSIS — F17200 Nicotine dependence, unspecified, uncomplicated: Secondary | ICD-10-CM

## 2022-07-03 DIAGNOSIS — R202 Paresthesia of skin: Secondary | ICD-10-CM

## 2022-07-03 DIAGNOSIS — M19071 Primary osteoarthritis, right ankle and foot: Secondary | ICD-10-CM

## 2022-07-03 DIAGNOSIS — D126 Benign neoplasm of colon, unspecified: Secondary | ICD-10-CM | POA: Diagnosis not present

## 2022-07-03 DIAGNOSIS — Z23 Encounter for immunization: Secondary | ICD-10-CM | POA: Diagnosis not present

## 2022-07-03 DIAGNOSIS — R030 Elevated blood-pressure reading, without diagnosis of hypertension: Secondary | ICD-10-CM

## 2022-07-03 MED ORDER — DICLOFENAC SODIUM 1 % EX GEL: 2.0000 g | Freq: Four times a day (QID) | CUTANEOUS | 1 refills | Status: DC

## 2022-07-03 NOTE — Progress Notes (Signed)
Patient ID: Christina Cannon, female    DOB: 1966-02-20  MRN: [id number]  CC: Follow-up (Requesting ibuprofin 800 mg for L knee. Neoma Laming received flu vax this season.)   Subjective: Christina Cannon is a 56 y.o. female who presents for chronic ds management Her concerns today include:  Pt with hx of tob dep, diverticulosis, obese, OA knees/ankles, diverticulitis   OA knees:  took Ibuprofen 800 mg one time about 1 wk ago. Felt much better.  Was able to walk up and down the stairs. Knees worse in cold weather.  Wants to know if she can get rxn for it.   BP elev today; no hx of HTN Salts food to taste Mild HA last night  Had c-scope and had several polyps removed.  Large polyp was not able to be removed completely.  Patient states she is due to have it removed under anesthesia on 07/19/2022.  Pathology of polyps removed showed most of them to be adenomatous. No problems with anesthesia in the past.  Tob Dep:  has not smoked since Thanksgiving. Has given it up.  Tries not to be around people who smokes  Obesity;  wgh has stayed stable Usually skips BF because she does not have to get up until around 10 a.m for work.  Usually has salad for lunch.  No red meat/pork.  Eats fruits daily. Not really exercising but does a lot of walking  Patient reports numbness in the arms at nights unless she lays on her back.  She gets numbness in the arms which ever side she lays on.  No significant neck pain.  This has been going on for months.  HM: She was supposed to receive Shingrix vaccine on last visit but it was not given.  She is agreeable to receiving it today.  Had flu vaccine on 05/22/2022 at work.  She is a: Glass blower/designer. Patient Active Problem List   Diagnosis Date Noted   Prolonged QT interval 12/21/2021   Colitis 12/21/2021   Diverticulitis 12/20/2021   Hypokalemia 12/20/2021   Obesity (BMI 30-39.9) 12/20/2021   Tobacco use 12/20/2021   History of colonic diverticulitis  07/09/2017   Primary osteoarthritis of both knees 07/09/2017   Primary osteoarthritis of both ankles 07/09/2017   Tobacco dependence 07/09/2017   Elevated blood pressure reading 07/09/2017   Leukocytosis 06/10/2017   Skin tag of vulva 08/10/2015     Current Outpatient Medications on File Prior to Visit  Medication Sig Dispense Refill   aspirin-sod bicarb-citric acid (ALKA-SELTZER) 325 MG TBEF tablet Take 325 mg by mouth every 6 (six) hours as needed (cold symptoms). (Patient not taking: Reported on 07/03/2022)     ibuprofen (ADVIL) 200 MG tablet Take 200 mg by mouth every 6 (six) hours as needed for moderate pain. (Patient not taking: Reported on 07/03/2022)     ondansetron (ZOFRAN-ODT) 4 MG disintegrating tablet Take 1 tablet (4 mg total) by mouth every 8 (eight) hours as needed for nausea or vomiting. (Patient not taking: Reported on 07/03/2022) 20 tablet 0   oseltamivir (TAMIFLU) 75 MG capsule Take 1 capsule (75 mg total) by mouth every 12 (twelve) hours. (Patient not taking: Reported on 05/10/2022) 10 capsule 0   Pseudoeph-Doxylamine-DM-APAP (NYQUIL PO) Take 30 mLs by mouth at bedtime as needed (cold symptoms). (Patient not taking: Reported on 07/03/2022)     No current facility-administered medications on file prior to visit.    Allergies  Allergen Reactions   Ceftin [Cefuroxime Axetil] Anaphylaxis and Swelling  Throat and face swell. Pt stated she had a regimen in the hospital recently(2018) and did just fine.    Social History   Socioeconomic History   Marital status: Widowed    Spouse name: Not on file   Number of children: Not on file   Years of education: Not on file   Highest education level: Not on file  Occupational History   Not on file  Tobacco Use   Smoking status: Some Days    Packs/day: 0.25    Types: Cigarettes    Last attempt to quit: 11/2021    Years since quitting: 0.6   Smokeless tobacco: Never   Tobacco comments:    Former occasional smoker  started around age 29,   Vaping Use   Vaping Use: Never used  Substance and Sexual Activity   Alcohol use: Never   Drug use: Not Currently    Types: Marijuana    Comment: Prior use   Sexual activity: Yes    Birth control/protection: None, Post-menopausal  Other Topics Concern   Not on file  Social History Narrative   Not on file   Social Determinants of Health   Financial Resource Strain: Not on file  Food Insecurity: Not on file  Transportation Needs: Not on file  Physical Activity: Insufficiently Active (09/05/2017)   Exercise Vital Sign    Days of Exercise per Week: 2 days    Minutes of Exercise per Session: 10 min  Stress: No Stress Concern Present (09/05/2017)   [place] of Stress : Not at all  Social Connections: Somewhat Isolated (09/05/2017)   Social Connection and Isolation Panel [NHANES]    Frequency of Communication with Friends and Family: Twice a week    Frequency of Social Gatherings with Friends and Family: Twice a week    Attends Religious Services: More than 4 times per year    Active Member of [employer] or Organizations: No    Attends Archivist Meetings: Never    Marital Status: Widowed  Intimate Partner Violence: Not At Risk (09/05/2017)   Humiliation, Afraid, Rape, and Kick questionnaire    Fear of Current or Ex-Partner: No    Emotionally Abused: No    Physically Abused: No    Sexually Abused: No    Family History  Problem Relation Age of Onset   Diabetes Mother    Hypertension Mother    Diabetes Father    Hypertension Father    Heart failure Father    Colon cancer Neg Hx    Colon polyps Neg Hx    Esophageal cancer Neg Hx    Rectal cancer Neg Hx    Stomach cancer Neg Hx     Past Surgical History:  Procedure Laterality Date   CHOLECYSTECTOMY  1988   HAND SURGERY  1997    ROS: Review of Systems Negative except as stated above  PHYSICAL EXAM: BP (!)  160/96 (BP Location: Left Arm, Patient Position: Sitting, Cuff Size: Normal)   Pulse 77   Temp 98.1 F (36.7 C) (Oral)   Ht '5\' 7"'$  (1.702 m)   Wt 224 lb (101.6 kg)   LMP 05/29/2019   SpO2 98%   BMI 35.08 kg/m   Wt Readings from Last 3 Encounters:  07/03/22 224 lb (101.6 kg)  05/10/22 224 lb (101.6 kg)  03/16/22 224 lb (101.6 kg)    Physical Exam  General appearance - alert, well appearing, older African-American  female and in no distress Mental status - normal mood, behavior, speech, dress, motor activity, and thought processes Neck - supple, no significant adenopathy Chest - clear to auscultation, no wheezes, rales or rhonchi, symmetric air entry Heart - normal rate, regular rhythm, normal S1, S2, no murmurs, rubs, clicks or gallops Musculoskeletal -knees: Mild enlargement of knee joints.  No point tenderness.  Mild crepitus on passive range of motion. Extremities -no lower extremity edema. Neuro: Grip 5/5 bilaterally.  Power in both upper extremities proximally and distally 5/5.  Gross sensation intact.     Latest Ref Rng & Units 02/25/2022    2:21 PM 12/24/2021    3:34 AM 12/23/2021    2:28 AM  CMP  Glucose 70 - 99 mg/dL 96  89  94   BUN 6 - 20 mg/dL '13  11  11   '$ Creatinine 0.44 - 1.00 mg/dL 0.99  0.98  1.03   Sodium 135 - 145 mmol/L 138  138  141   Potassium 3.5 - 5.1 mmol/L 3.1  3.6  4.0   Chloride 98 - 111 mmol/L 104  110  112   CO2 22 - 32 mmol/L '25  22  21   '$ Calcium 8.9 - 10.3 mg/dL 8.6  8.7  8.8   Total Protein 6.5 - 8.1 g/dL 8.0     Total Bilirubin 0.3 - 1.2 mg/dL 0.5     Alkaline Phos 38 - 126 U/L 56     AST 15 - 41 U/L 23     ALT 0 - 44 U/L 20      Lipid Panel  No results found for: "CHOL", "TRIG", "HDL", "CHOLHDL", "VLDL", "LDLCALC", "LDLDIRECT"  CBC    Component Value Date/Time   WBC 7.1 02/25/2022 1421   RBC 4.77 02/25/2022 1421   HGB 13.6 02/25/2022 1421   HCT 40.3 02/25/2022 1421   PLT 194 02/25/2022 1421   MCV 84.5 02/25/2022 1421   MCH 28.5  02/25/2022 1421   MCHC 33.7 02/25/2022 1421   RDW 13.8 02/25/2022 1421   LYMPHSABS 2.6 12/24/2021 0334   MONOABS 0.7 12/24/2021 0334   EOSABS 0.1 12/24/2021 0334   BASOSABS 0.0 12/24/2021 0334    ASSESSMENT AND PLAN:  1. Class 2 severe obesity with serious comorbidity and body mass index (BMI) of 35.0 to 35.9 in adult, unspecified obesity type [facility] Discussed on encourage healthy eating habits. Encouraged her to move as much as her knees will allow.  2. Elevated blood pressure reading in office without diagnosis of hypertension DASH diet discussed and encouraged. She does have a home blood pressure device.  Advised to check blood pressure at least twice a week and bring readings with her to see the clinical pharmacist in 2 weeks.  3. Adenomatous polyp of colon, unspecified part of colon Patient has upcoming procedure on the 28th of this month to remove large polyp that was unable to be totally resected during recent colonoscopy.  4. Bilateral arm numbness and tingling while sleeping - Ambulatory referral to Neurology  5. Primary osteoarthritis of both ankles I advised against using ibuprofen at this time given elevated blood pressure.  We discussed trying meloxicam versus topical.  She prefers topical anti-inflammatory. - diclofenac Sodium (VOLTAREN) 1 % GEL; Apply 2 g topically 4 (four) times daily.  Dispense: 100 g; Refill: 1  6. Need for shingles vaccine First Shingrix vaccine given today.  Vies that the vaccine can cause some swelling and redness at the injection site.  7. Tobacco  dependence Commended her on quitting.  Encouraged her to remain tobacco free.    Patient was given the opportunity to ask questions.  Patient verbalized understanding of the plan and was able to repeat key elements of the plan.   This documentation was completed using Radio producer.  Any transcriptional errors are unintentional.  No orders of the defined types were placed  in this encounter.    Requested Prescriptions    No prescriptions requested or ordered in this encounter    No follow-ups on file.  Karle Plumber, MD, FACP

## 2022-07-03 NOTE — Patient Instructions (Addendum)
Your blood pressure is elevated today.  Please check your blood pressure at least twice a week and record the readings.  Bring the readings with you when you come to see our clinical pharmacist in 2 weeks for recheck of your blood pressure.  Try to limit salt in the foods as much as possible.  Continue to try remain free of cigarettes.

## 2022-07-10 ENCOUNTER — Encounter (HOSPITAL_COMMUNITY): Payer: Self-pay | Admitting: Gastroenterology

## 2022-07-18 NOTE — Anesthesia Preprocedure Evaluation (Signed)
Anesthesia Evaluation  Patient identified by MRN, date of birth, ID band Patient awake    Reviewed: Allergy & Precautions, NPO status , Patient's Chart, lab work & pertinent test results  Airway Mallampati: II  TM Distance: >3 FB Neck ROM: Full    Dental  (+) Upper Dentures   Pulmonary Current Smoker and Patient abstained from smoking.   Pulmonary exam normal        Cardiovascular negative cardio ROS  Rhythm:Regular Rate:Normal     Neuro/Psych negative neurological ROS  negative psych ROS   GI/Hepatic Neg liver ROS,,,Colon polyp   Endo/Other  negative endocrine ROS    Renal/GU negative Renal ROS  negative genitourinary   Musculoskeletal  (+) Arthritis , Osteoarthritis,    Abdominal Normal abdominal exam  (+)   Peds  Hematology negative hematology ROS (+)   Anesthesia Other Findings   Reproductive/Obstetrics                             Anesthesia Physical Anesthesia Plan  ASA: 2  Anesthesia Plan: MAC   Post-op Pain Management:    Induction: Intravenous  PONV Risk Score and Plan: 1 and Propofol infusion and Treatment may vary due to age or medical condition  Airway Management Planned: Natural Airway, Simple Face Mask and Nasal Cannula  Additional Equipment: None  Intra-op Plan:   Post-operative Plan:   Informed Consent: I have reviewed the patients History and Physical, chart, labs and discussed the procedure including the risks, benefits and alternatives for the proposed anesthesia with the patient or authorized representative who has indicated his/her understanding and acceptance.     Dental advisory given  Plan Discussed with: CRNA  Anesthesia Plan Comments:        Anesthesia Quick Evaluation

## 2022-07-19 ENCOUNTER — Ambulatory Visit (HOSPITAL_COMMUNITY)
Admission: RE | Admit: 2022-07-19 | Discharge: 2022-07-19 | Disposition: A | Discharge status: Home / Self Care | Payer: Commercial Managed Care - HMO | Attending: Gastroenterology | Admitting: Gastroenterology

## 2022-07-19 ENCOUNTER — Other Ambulatory Visit: Payer: Self-pay

## 2022-07-19 ENCOUNTER — Ambulatory Visit (HOSPITAL_BASED_OUTPATIENT_CLINIC_OR_DEPARTMENT_OTHER): Payer: Commercial Managed Care - HMO | Admitting: Anesthesiology

## 2022-07-19 ENCOUNTER — Ambulatory Visit (HOSPITAL_COMMUNITY): Payer: Commercial Managed Care - HMO | Admitting: Anesthesiology

## 2022-07-19 ENCOUNTER — Encounter (HOSPITAL_COMMUNITY)
Admission: RE | Disposition: A | Discharge status: Home / Self Care | Payer: Self-pay | Source: Home / Self Care | Attending: Gastroenterology

## 2022-07-19 ENCOUNTER — Encounter (HOSPITAL_COMMUNITY): Payer: Self-pay | Admitting: Gastroenterology

## 2022-07-19 DIAGNOSIS — K635 Polyp of colon: Secondary | ICD-10-CM

## 2022-07-19 DIAGNOSIS — K6389 Other specified diseases of intestine: Secondary | ICD-10-CM | POA: Diagnosis not present

## 2022-07-19 DIAGNOSIS — D125 Benign neoplasm of sigmoid colon: Secondary | ICD-10-CM

## 2022-07-19 DIAGNOSIS — D124 Benign neoplasm of descending colon: Secondary | ICD-10-CM | POA: Diagnosis not present

## 2022-07-19 DIAGNOSIS — K529 Noninfective gastroenteritis and colitis, unspecified: Secondary | ICD-10-CM

## 2022-07-19 DIAGNOSIS — K5730 Diverticulosis of large intestine without perforation or abscess without bleeding: Secondary | ICD-10-CM | POA: Insufficient documentation

## 2022-07-19 DIAGNOSIS — Z87891 Personal history of nicotine dependence: Secondary | ICD-10-CM | POA: Insufficient documentation

## 2022-07-19 DIAGNOSIS — M1990 Unspecified osteoarthritis, unspecified site: Secondary | ICD-10-CM

## 2022-07-19 HISTORY — PX: BIOPSY: SHX5522

## 2022-07-19 HISTORY — PX: POLYPECTOMY: SHX5525

## 2022-07-19 HISTORY — PX: COLONOSCOPY WITH PROPOFOL: SHX5780

## 2022-07-19 SURGERY — COLONOSCOPY WITH PROPOFOL
Anesthesia: Monitor Anesthesia Care

## 2022-07-19 MED ORDER — ESMOLOL HCL 100 MG/10ML IV SOLN
INTRAVENOUS | Status: DC | PRN
  Administered 2022-07-19: 10 mg via INTRAVENOUS

## 2022-07-19 MED ORDER — FENTANYL CITRATE (PF) 100 MCG/2ML IJ SOLN
INTRAMUSCULAR | Status: AC
  Filled 2022-07-19: qty 2

## 2022-07-19 MED ORDER — SODIUM CHLORIDE 0.9 % IV SOLN: INTRAVENOUS | Status: DC

## 2022-07-19 MED ORDER — PROPOFOL 10 MG/ML IV BOLUS
INTRAVENOUS | Status: DC | PRN
  Administered 2022-07-19: 50 mg via INTRAVENOUS

## 2022-07-19 MED ORDER — LACTATED RINGERS IV SOLN: INTRAVENOUS | Status: DC

## 2022-07-19 MED ORDER — PROPOFOL 500 MG/50ML IV EMUL
INTRAVENOUS | Status: DC | PRN
  Administered 2022-07-19: 150 ug/kg/min via INTRAVENOUS

## 2022-07-19 MED ORDER — FENTANYL CITRATE (PF) 100 MCG/2ML IJ SOLN
INTRAMUSCULAR | Status: DC | PRN
  Administered 2022-07-19 (×2): 50 ug via INTRAVENOUS

## 2022-07-19 SURGICAL SUPPLY — 22 items

## 2022-07-19 NOTE — Anesthesia Postprocedure Evaluation (Signed)
Anesthesia Post Note  Patient: Christina Cannon  Procedure(s) Performed: COLONOSCOPY WITH PROPOFOL POLYPECTOMY BIOPSY     Patient location during evaluation: Endoscopy Anesthesia Type: MAC Level of consciousness: awake and alert Pain management: pain level controlled Vital Signs Assessment: post-procedure vital signs reviewed and stable Respiratory status: spontaneous breathing, nonlabored ventilation, respiratory function stable and patient connected to nasal cannula oxygen Cardiovascular status: stable and blood pressure returned to baseline Postop Assessment: no apparent nausea or vomiting Anesthetic complications: no   No notable events documented.  Last Vitals:  Vitals:   07/19/22 1030 07/19/22 1040  BP: (!) 150/65 (!) 141/89  Pulse: 65 61  Resp: 18 19  Temp:    SpO2: 96% 98%    Last Pain:  Vitals:   07/19/22 1040  TempSrc:   PainSc: 0-No pain                 Belenda Cruise P Orva Riles

## 2022-07-19 NOTE — Op Note (Signed)
[facility] Patient Name: Christina Cannon Procedure Date: 07/19/2022 MRN: [id number] Attending MD: Gladstone Pih. Candis Schatz , MD, [id number] Date of Birth: 1965/10/05 CSN: [id number] Age: 56 Admit Type: Outpatient Procedure:                Colonoscopy Indications:              Therapeutic procedure for colon polyps Providers:                Nicki Reaper E. Candis Schatz, MD, Dulcy Fanny,                            William Dalton, Technician Referring MD:              Medicines:                Monitored Anesthesia Care Complications:            No immediate complications. Estimated Blood Loss:     Estimated blood loss was minimal. Procedure:                Pre-Anesthesia Assessment:                           - Prior to the procedure, a History and Physical                            was performed, and patient medications and                            allergies were reviewed. The patient's tolerance of                            previous anesthesia was also reviewed. The risks                            and benefits of the procedure and the sedation                            options and risks were discussed with the patient.                            All questions were answered, and informed consent                            was obtained. Prior Anticoagulants: The patient has                            taken no anticoagulant or antiplatelet agents. ASA                            Grade Assessment: III - A patient with severe                            systemic disease. After reviewing the risks and  benefits, the patient was deemed in satisfactory                            condition to undergo the procedure.                           After obtaining informed consent, the colonoscope                            was passed under direct vision. Throughout the                            procedure, the patient's blood pressure, pulse, and                             oxygen saturations were monitored continuously. The                            CF-HQ190L ([id number]) Olympus colonoscope was                            introduced through the anus and advanced to the the                            terminal ileum, with identification of the                            appendiceal orifice and IC valve. The colonoscopy                            was performed without difficulty. The patient                            tolerated the procedure well. The quality of the                            bowel preparation was adequate. The terminal ileum,                            ileocecal valve, appendiceal orifice, and rectum                            were photographed. Scope In: 9:13:53 AM Scope Out: 10:07:54 AM Scope Withdrawal Time: 0 hours 40 minutes 4 seconds  Total Procedure Duration: 0 hours 54 minutes 1 second  Findings:      The perianal and digital rectal examinations were normal. Pertinent       negatives include normal sphincter tone and no palpable rectal lesions.      The ileocecal valve was moderately lipomatous. Bite on bite bopsies were       taken with a cold forceps for histology. Estimated blood loss was       minimal.      A 6 mm erythematous polyp was found in the descending colon. The polyp       was pedunculated. The polyp was  removed with a hot snare. Resection and       retrieval were complete. Estimated blood loss: none.      A 12 mm erythematous polypoid lesion was found in the sigmoid colon. The       lesion was semi-pedunculated. The polyp was removed with a hot snare.       Resection and retrieval were complete. Estimated blood loss: none.      A 20 mm erythematous polypoid lesion was found in the sigmoid colon. The       lesion was semi-pedunculated. The polyp was removed with a hot snare.       Resection and retrieval were complete. Estimated blood loss: none.      A scattered area of erythematous and inflamed mucosa was  found in the       sigmoid colon and in the descending colon. Biopsies were taken with a       cold forceps for histology. Estimated blood loss was minimal.      Many large-mouthed, medium-mouthed and small-mouthed diverticula were       found in the sigmoid colon, descending colon, transverse colon and       ascending colon. There was narrowing of the colon in association with       the diverticular opening. There was evidence of diverticular spasm.       Erythema was seen in association with the diverticular opening.       Peri-diverticular erythema was seen. There was evidence of an impacted       diverticulum.      The exam was otherwise normal throughout the examined colon.      The terminal ileum appeared normal.      The retroflexed view of the distal rectum and anal verge was normal and       showed no anal or rectal abnormalities. Impression:               - Lipomatous ileocecal valve. Biopsied.                           - One 6 mm polyp in the descending colon, removed                            with a hot snare. Resected and retrieved.                           - Benign polypoid lesion in the sigmoid colon.                            Complete removal was accomplished.                           - Benign polypoid lesion in the sigmoid colon.                            Complete removal was accomplished.                           - Erythematous and inflamed mucosa in the sigmoid  colon and in the descending colon. Biopsied.                           - Moderate diverticulosis in the sigmoid colon, in                            the descending colon, in the transverse colon and                            in the ascending colon. There was narrowing of the                            colon in association with the diverticular opening.                            There was evidence of diverticular spasm. Erythema                            was seen in association  with the diverticular                            opening. Peri-diverticular erythema was seen. There                            was evidence of an impacted diverticulum.                           - The examined portion of the ileum was normal.                           - The distal rectum and anal verge are normal on                            retroflexion view.                           - The large pedunculated polyp in the descending                            colon that was seen in October was not seen on                            today's exam despite numerous and thorough                            examinations of the colon. The left colon was                            characterized by extensive diverticulosis with                            narrowing of the colon and mucosal prolapse with  scattered erythematous mucosal changes. The                            erythematous polyps seemed more consistent with                            mucosal prolapse/inflammatory polyps, but a                            previous biopsy of the larger polyp came back as                            adenoma. I suspect that the large descending colon                            polyp was also inflammatory in nature and resolved                            spontaneously. Moderate Sedation:      Not Applicable - Patient had care per Anesthesia. Recommendation:           - Patient has a contact number available for                            emergencies. The signs and symptoms of potential                            delayed complications were discussed with the                            patient. Return to normal activities tomorrow.                            Written discharge instructions were provided to the                            patient.                           - Resume previous diet.                           - Continue present medications.                           -  Await pathology results.                           - Repeat colonoscopy (date not yet determined) for                            surveillance based on pathology results.                           - If the polyps removed today are indeed  adenomatous, close surveillance will be needed, as                            this raises the possibility that the large                            descending colon polyp seen in October may have                            somehow been missed on today's exam. Procedure Code(s):        --- Professional ---                           [phone], Colonoscopy, flexible; with removal of                            tumor(s), polyp(s), or other lesion(s) by snare                            technique                           45380, 40, Colonoscopy, flexible; with biopsy,                            single or multiple Diagnosis Code(s):        --- Professional ---                           K63.89, Other specified diseases of intestine                           D12.4, Benign neoplasm of descending colon                           D12.5, Benign neoplasm of sigmoid colon                           K52.9, Noninfective gastroenteritis and colitis,                            unspecified                           K63.5, Polyp of colon                           K57.30, Diverticulosis of large intestine without                            perforation or abscess without bleeding CPT copyright 2022 American Medical Association. All rights reserved. The codes documented in this report are preliminary and upon coder review may  be revised to meet current compliance requirements. Azlan Hanway E. Candis Schatz, MD 07/19/2022 10:29:47 AM This report has been signed electronically. Number of Addenda: 0

## 2022-07-19 NOTE — H&P (Signed)
[facility] History and Physical   Primary Care Physician:  Ladell Pier, MD   Reason for Procedure:   Removal of large polyp  Plan:    Colonoscopy with polypectomy      HPI: Christina Cannon is a 56 y.o. female undergoing repeat colonoscopy after her initial average risk screening colonoscopy in October showed a large pedunculated polyp in the descending colon that was unable to be resected.  Four other polyps were removed and a large erythematous polypoid lesion in the sigmoid colon was biopsied.  The biopsies of the large descending polyp and the sigmoid were erroneously placed in the same jar and were consistent with tubular adenomas.  The bowel prep was inadequate.  Repeat colonoscopy to remove large polyp and look for other polyps that may have been missed due to inadequate prep.   Past Medical History:  Diagnosis Date   Diverticulitis     Past Surgical History:  Procedure Laterality Date   CHOLECYSTECTOMY  1988   HAND SURGERY  1997    Prior to Admission medications   Medication Sig Start Date End Date Taking? Authorizing Provider  diclofenac Sodium (VOLTAREN) 1 % GEL Apply 2 g topically 4 (four) times daily. 07/03/22  Yes Ladell Pier, MD  ondansetron (ZOFRAN-ODT) 4 MG disintegrating tablet Take 1 tablet (4 mg total) by mouth every 8 (eight) hours as needed for nausea or vomiting. 02/25/22  Yes Lucrezia Starch, MD    Current Facility-Administered Medications  Medication Dose Route Frequency Provider Last Rate Last Admin   0.9 %  sodium chloride infusion   Intravenous Continuous Daryel November, MD       lactated ringers infusion   Intravenous Continuous Daryel November, MD 10 mL/hr at 07/19/22 0803 New Bag at 07/19/22 0803    Allergies as of 05/28/2022 - Review Complete 05/10/2022  Allergen Reaction Noted   Ceftin [cefuroxime axetil] Anaphylaxis and Swelling 07/24/2012    Family History  Problem Relation Age of Onset    Diabetes Mother    Hypertension Mother    Diabetes Father    Hypertension Father    Heart failure Father    Colon cancer Neg Hx    Colon polyps Neg Hx    Esophageal cancer Neg Hx    Rectal cancer Neg Hx    Stomach cancer Neg Hx     Social History   Socioeconomic History   Marital status: Widowed    Spouse name: Not on file   Number of children: Not on file   Years of education: Not on file   Highest education level: Not on file  Occupational History   Not on file  Tobacco Use   Smoking status: Some Days    Packs/day: 0.25    Types: Cigarettes    Last attempt to quit: 11/2021    Years since quitting: 0.6   Smokeless tobacco: Never   Tobacco comments:    Former occasional smoker started around age 49,   Vaping Use   Vaping Use: Never used  Substance and Sexual Activity   Alcohol use: Never   Drug use: Not Currently    Types: Marijuana    Comment: Prior use   Sexual activity: Yes    Birth control/protection: None, Post-menopausal  Other Topics Concern   Not on file  Social History Narrative   Not on file   Social Determinants of Health   Financial Resource Strain: Not on file  Food Insecurity: Not on file  Transportation Needs: Not on file  Physical Activity: Insufficiently Active (09/05/2017)   Exercise Vital Sign    Days of Exercise per Week: 2 days    Minutes of Exercise per Session: 10 min  Stress: No Stress Concern Present (09/05/2017)   [place]    Feeling of Stress : Not at all  Social Connections: Somewhat Isolated (09/05/2017)   Social Connection and Isolation Panel [NHANES]    Frequency of Communication with Friends and Family: Twice a week    Frequency of Social Gatherings with Friends and Family: Twice a week    Attends Religious Services: More than 4 times per year    Active Member of [employer] or Organizations: No    Attends Archivist Meetings: Never    Marital Status:  Widowed  Intimate Partner Violence: Not At Risk (09/05/2017)   Humiliation, Afraid, Rape, and Kick questionnaire    Fear of Current or Ex-Partner: No    Emotionally Abused: No    Physically Abused: No    Sexually Abused: No    Review of Systems:  All other review of systems negative except as mentioned in the HPI.  Physical Exam: Vital signs BP (!) 152/84   Pulse 85   Temp (!) 97.1 F (36.2 C) (Temporal)   Resp 14   Ht '5\' 7"'$  (1.702 m)   Wt 97.1 kg   LMP 05/29/2019   SpO2 98%   BMI 33.52 kg/m   General:   Alert,  Well-developed, well-nourished, pleasant and cooperative in NAD Airway:  Mallampati 2 Lungs:  Clear throughout to auscultation.   Heart:  Regular rate and rhythm; no murmurs, clicks, rubs,  or gallops. Abdomen:  Soft, nontender and nondistended. Normal bowel sounds.   Neuro/Psych:  Normal mood and affect. A and O x 3   Adisa Vigeant E. Candis Schatz, MD [facility]

## 2022-07-19 NOTE — Discharge Instructions (Signed)
YOU HAD AN ENDOSCOPIC PROCEDURE TODAY: Refer to the procedure report and other information in the discharge instructions given to you for any specific questions about what was found during the examination. If this information does not answer your questions, please call [place] office at [phone] to clarify.  ° °YOU SHOULD EXPECT: Some feelings of bloating in the abdomen. Passage of more gas than usual. Walking can help get rid of the air that was put into your GI tract during the procedure and reduce the bloating. If you had a lower endoscopy (such as a colonoscopy or flexible sigmoidoscopy) you may notice spotting of blood in your stool or on the toilet paper. Some abdominal soreness may be present for a day or two, also. ° °DIET: Your first meal following the procedure should be a light meal and then it is ok to progress to your normal diet. A half-sandwich or bowl of soup is an example of a good first meal. Heavy or fried foods are harder to digest and may make you feel nauseous or bloated. Drink plenty of fluids but you should avoid alcoholic beverages for 24 hours. If you had a esophageal dilation, please see attached instructions for diet.   ° °ACTIVITY: Your care partner should take you home directly after the procedure. You should plan to take it easy, moving slowly for the rest of the day. You can resume normal activity the day after the procedure however YOU SHOULD NOT DRIVE, use power tools, machinery or perform tasks that involve climbing or major physical exertion for 24 hours (because of the sedation medicines used during the test).  ° °SYMPTOMS TO REPORT IMMEDIATELY: °A gastroenterologist can be reached at any hour. Please call [phone]  for any of the following symptoms:  °Following lower endoscopy (colonoscopy, flexible sigmoidoscopy) °Excessive amounts of blood in the stool  °Significant tenderness, worsening of abdominal pains  °Swelling of the abdomen that is new, acute  °Fever of 100° or  higher  °Following upper endoscopy (EGD, EUS, ERCP, esophageal dilation) °Vomiting of blood or coffee ground material  °New, significant abdominal pain  °New, significant chest pain or pain under the shoulder blades  °Painful or persistently difficult swallowing  °New shortness of breath  °Black, tarry-looking or red, bloody stools ° °FOLLOW UP:  °If any biopsies were taken you will be contacted by phone or by letter within the next 1-3 weeks. Call [phone]  if you have not heard about the biopsies in 3 weeks.  °Please also call with any specific questions about appointments or follow up tests. ° °

## 2022-07-19 NOTE — Transfer of Care (Signed)
Immediate Anesthesia Transfer of Care Note  Patient: Christina Cannon  Procedure(s) Performed: COLONOSCOPY WITH PROPOFOL POLYPECTOMY BIOPSY  Patient Location: Short Stay  Anesthesia Type:MAC  Level of Consciousness: awake, alert , and patient cooperative  Airway & Oxygen Therapy: Patient Spontanous Breathing  Post-op Assessment: Report given to RN, Post -op Vital signs reviewed and stable, and Patient moving all extremities X 4  Post vital signs: Reviewed and stable  Last Vitals:  Vitals Value Taken Time  BP 132/69   Temp    Pulse 76 07/19/22 1015  Resp 16 07/19/22 1015  SpO2 97 % 07/19/22 1015  Vitals shown include unvalidated device data.  Last Pain:  Vitals:   07/19/22 0752  TempSrc: Temporal  PainSc: 0-No pain         Complications: No notable events documented.

## 2022-07-20 LAB — SURGICAL PATHOLOGY

## 2022-07-21 ENCOUNTER — Encounter (HOSPITAL_COMMUNITY): Payer: Self-pay | Admitting: Gastroenterology

## 2022-07-25 ENCOUNTER — Encounter: Payer: Self-pay | Admitting: Gastroenterology

## 2022-08-06 ENCOUNTER — Ambulatory Visit: Payer: Self-pay | Admitting: Pharmacist

## 2022-11-02 ENCOUNTER — Ambulatory Visit: Payer: Commercial Managed Care - HMO | Attending: Internal Medicine | Admitting: Internal Medicine

## 2023-04-30 ENCOUNTER — Other Ambulatory Visit: Payer: Self-pay

## 2023-04-30 ENCOUNTER — Emergency Department (HOSPITAL_COMMUNITY): Payer: 59

## 2023-04-30 ENCOUNTER — Encounter (HOSPITAL_COMMUNITY): Payer: Self-pay | Admitting: Emergency Medicine

## 2023-04-30 ENCOUNTER — Emergency Department (HOSPITAL_COMMUNITY)
Admission: EM | Admit: 2023-04-30 | Discharge: 2023-04-30 | Disposition: A | Discharge status: Home / Self Care | Payer: 59 | Attending: Emergency Medicine | Admitting: Emergency Medicine

## 2023-04-30 DIAGNOSIS — K5792 Diverticulitis of intestine, part unspecified, without perforation or abscess without bleeding: Secondary | ICD-10-CM

## 2023-04-30 DIAGNOSIS — K5732 Diverticulitis of large intestine without perforation or abscess without bleeding: Secondary | ICD-10-CM | POA: Diagnosis not present

## 2023-04-30 DIAGNOSIS — R112 Nausea with vomiting, unspecified: Secondary | ICD-10-CM | POA: Diagnosis present

## 2023-04-30 LAB — URINALYSIS, ROUTINE W REFLEX MICROSCOPIC

## 2023-04-30 LAB — CBC
HCT: 42.6 % (ref 36.0–46.0)
Hemoglobin: 14.3 g/dL (ref 12.0–15.0)
MCH: 28.9 pg (ref 26.0–34.0)
MCHC: 33.6 g/dL (ref 30.0–36.0)
MCV: 86.1 fL (ref 80.0–100.0)
Platelets: 213 10*3/uL (ref 150–400)
RBC: 4.95 MIL/uL (ref 3.87–5.11)
RDW: 13.1 % (ref 11.5–15.5)
WBC: 11 10*3/uL — ABNORMAL HIGH (ref 4.0–10.5)
nRBC: 0 % (ref 0.0–0.2)

## 2023-04-30 LAB — COMPREHENSIVE METABOLIC PANEL
ALT: 18 U/L (ref 0–44)
AST: 19 U/L (ref 15–41)
Albumin: 3.5 g/dL (ref 3.5–5.0)
Alkaline Phosphatase: 64 U/L (ref 38–126)
Anion gap: 12 (ref 5–15)
BUN: 11 mg/dL (ref 6–20)
CO2: 20 mmol/L — ABNORMAL LOW (ref 22–32)
Calcium: 8.8 mg/dL — ABNORMAL LOW (ref 8.9–10.3)
Chloride: 105 mmol/L (ref 98–111)
Creatinine, Ser: 1.14 mg/dL — ABNORMAL HIGH (ref 0.44–1.00)
GFR, Estimated: 56 mL/min — ABNORMAL LOW (ref >60)
Glucose, Bld: 160 mg/dL — ABNORMAL HIGH (ref 70–99)
Potassium: 3.4 mmol/L — ABNORMAL LOW (ref 3.5–5.1)
Sodium: 137 mmol/L (ref 135–145)
Total Bilirubin: 1 mg/dL (ref 0.3–1.2)
Total Protein: 7.1 g/dL (ref 6.5–8.1)

## 2023-04-30 LAB — URINALYSIS, MICROSCOPIC (REFLEX)

## 2023-04-30 LAB — LIPASE, BLOOD: Lipase: 22 U/L (ref 11–51)

## 2023-04-30 MED ORDER — IOHEXOL 350 MG/ML SOLN
75.0000 mL | Freq: Once | INTRAVENOUS | Status: AC | PRN
  Administered 2023-04-30: 75 mL via INTRAVENOUS

## 2023-04-30 MED ORDER — METRONIDAZOLE 500 MG PO TABS: 500.0000 mg | ORAL_TABLET | Freq: Two times a day (BID) | ORAL | 0 refills | Status: DC

## 2023-04-30 MED ORDER — METRONIDAZOLE 500 MG PO TABS
500.0000 mg | ORAL_TABLET | Freq: Once | ORAL | Status: AC
  Administered 2023-04-30: 500 mg via ORAL
  Filled 2023-04-30: qty 1

## 2023-04-30 MED ORDER — ONDANSETRON HCL 4 MG/2ML IJ SOLN
4.0000 mg | Freq: Once | INTRAMUSCULAR | Status: AC
  Administered 2023-04-30: 4 mg via INTRAVENOUS
  Filled 2023-04-30: qty 2

## 2023-04-30 MED ORDER — CIPROFLOXACIN HCL 500 MG PO TABS
500.0000 mg | ORAL_TABLET | Freq: Once | ORAL | Status: AC
  Administered 2023-04-30: 500 mg via ORAL
  Filled 2023-04-30: qty 1

## 2023-04-30 MED ORDER — SENNOSIDES-DOCUSATE SODIUM 8.6-50 MG PO TABS: 2.0000 | ORAL_TABLET | Freq: Every day | ORAL | 0 refills | Status: AC

## 2023-04-30 MED ORDER — HYDROCODONE-ACETAMINOPHEN 5-325 MG PO TABS: 1.0000 | ORAL_TABLET | Freq: Four times a day (QID) | ORAL | 0 refills | Status: DC | PRN

## 2023-04-30 MED ORDER — MORPHINE SULFATE (PF) 4 MG/ML IV SOLN
4.0000 mg | Freq: Once | INTRAVENOUS | Status: AC
  Administered 2023-04-30: 4 mg via INTRAVENOUS
  Filled 2023-04-30: qty 1

## 2023-04-30 MED ORDER — ONDANSETRON 4 MG PO TBDP: 4.0000 mg | ORAL_TABLET | Freq: Three times a day (TID) | ORAL | 0 refills | Status: DC | PRN

## 2023-04-30 MED ORDER — CIPROFLOXACIN HCL 500 MG PO TABS: 500.0000 mg | ORAL_TABLET | Freq: Two times a day (BID) | ORAL | 0 refills | Status: DC

## 2023-04-30 NOTE — ED Triage Notes (Signed)
Pt here for L lower abd pain radiating around L side into lower back. States she started vomiting when she arrived to work. Reports diarrhea also. Reports light headedness last night before going to bed.

## 2023-04-30 NOTE — Discharge Instructions (Addendum)
As discussed, your evaluation today has been largely reassuring.  But, it is important that you monitor your condition carefully, and do not hesitate to return to the ED if you develop new, or concerning changes in your condition. ? ?Otherwise, please follow-up with your physician for appropriate ongoing care. ? ?

## 2023-04-30 NOTE — ED Provider Notes (Signed)
[place] EMERGENCY DEPARTMENT AT [facility] Provider Note   CSN: [id number] Arrival date & time: 04/30/23  1243     History  Chief Complaint  Patient presents with   Abdominal Pain    Christina Cannon is a 57 y.o. female.  HPI Patient presents with abdominal pain nausea, vomiting.  There is associated diarrhea.  Onset was yesterday, pain is left lower quadrant, radiating to the flank.  No polyuria or dysuria that she notes that she has had discoloration of her urine since taking Azo pills due to presumed UTI.  She notes a history of diverticulitis in the distant past, as well as colonoscopy last year during which she was identified as having 25 polyps most of which were removed.  Biopsy results reportedly negative.    Home Medications Prior to Admission medications   Medication Sig Start Date End Date Taking? Authorizing Provider  ciprofloxacin (CIPRO) 500 MG tablet Take 1 tablet (500 mg total) by mouth 2 (two) times daily. 04/30/23  Yes Gerhard Munch, MD  HYDROcodone-acetaminophen (NORCO/VICODIN) 5-325 MG tablet Take 1 tablet by mouth every 6 (six) hours as needed. 04/30/23  Yes Gerhard Munch, MD  metroNIDAZOLE (FLAGYL) 500 MG tablet Take 1 tablet (500 mg total) by mouth 2 (two) times daily. 04/30/23  Yes Gerhard Munch, MD  senna-docusate (SENOKOT-S) 8.6-50 MG tablet Take 2 tablets by mouth daily for 10 days. 04/30/23 05/10/23 Yes Gerhard Munch, MD  diclofenac Sodium (VOLTAREN) 1 % GEL Apply 2 g topically 4 (four) times daily. 07/03/22   Marcine Matar, MD  ondansetron (ZOFRAN-ODT) 4 MG disintegrating tablet Take 1 tablet (4 mg total) by mouth every 8 (eight) hours as needed for nausea or vomiting. 04/30/23   Gerhard Munch, MD      Allergies    Ceftin [cefuroxime axetil]    Review of Systems   Review of Systems  All other systems reviewed and are negative.   Physical Exam Updated Vital Signs BP (!) 151/82   Pulse 72   Temp 98.4 F (36.9 C)  (Oral)   Resp 17   Ht 5\' 7"  (1.702 m)   Wt 94.3 kg   LMP 05/29/2019   SpO2 100%   BMI 32.58 kg/m  Physical Exam Vitals and nursing note reviewed.  Constitutional:      General: She is not in acute distress.    Appearance: She is well-developed. She is obese.  HENT:     Head: Normocephalic and atraumatic.  Eyes:     Conjunctiva/sclera: Conjunctivae normal.  Cardiovascular:     Rate and Rhythm: Normal rate and regular rhythm.  Pulmonary:     Effort: Pulmonary effort is normal. No respiratory distress.     Breath sounds: No stridor.  Abdominal:     General: There is no distension.     Tenderness: There is abdominal tenderness in the suprapubic area and left lower quadrant.  Skin:    General: Skin is warm and dry.  Neurological:     Mental Status: She is alert and oriented to person, place, and time.     Cranial Nerves: No cranial nerve deficit.  Psychiatric:        Mood and Affect: Mood normal.     ED Results / Procedures / Treatments   Labs (all labs ordered are listed, but only abnormal results are displayed) Labs Reviewed  COMPREHENSIVE METABOLIC PANEL - Abnormal; Notable for the following components:      Result Value   Potassium 3.4 (*)  CO2 20 (*)    Glucose, Bld 160 (*)    Creatinine, Ser 1.14 (*)    Calcium 8.8 (*)    GFR, Estimated 56 (*)    All other components within normal limits  CBC - Abnormal; Notable for the following components:   WBC 11.0 (*)    All other components within normal limits  URINALYSIS, ROUTINE W REFLEX MICROSCOPIC - Abnormal; Notable for the following components:   Color, Urine ORANGE (*)    Glucose, UA   (*)    Value: TEST NOT REPORTED DUE TO COLOR INTERFERENCE OF URINE PIGMENT   Hgb urine dipstick   (*)    Value: TEST NOT REPORTED DUE TO COLOR INTERFERENCE OF URINE PIGMENT   Bilirubin Urine   (*)    Value: TEST NOT REPORTED DUE TO COLOR INTERFERENCE OF URINE PIGMENT   Ketones, ur   (*)    Value: TEST NOT REPORTED DUE TO  COLOR INTERFERENCE OF URINE PIGMENT   Protein, ur   (*)    Value: TEST NOT REPORTED DUE TO COLOR INTERFERENCE OF URINE PIGMENT   Nitrite   (*)    Value: TEST NOT REPORTED DUE TO COLOR INTERFERENCE OF URINE PIGMENT   Leukocytes,Ua   (*)    Value: TEST NOT REPORTED DUE TO COLOR INTERFERENCE OF URINE PIGMENT   All other components within normal limits  URINALYSIS, MICROSCOPIC (REFLEX) - Abnormal; Notable for the following components:   Bacteria, UA FEW (*)    All other components within normal limits  LIPASE, BLOOD    EKG None  Radiology CT ABDOMEN PELVIS W CONTRAST  Result Date: 04/30/2023 CLINICAL DATA:  Left lower quadrant abdominal pain and history of diverticulitis. EXAM: CT ABDOMEN AND PELVIS WITH CONTRAST TECHNIQUE: Multidetector CT imaging of the abdomen and pelvis was performed using the standard protocol following bolus administration of intravenous contrast. RADIATION DOSE REDUCTION: This exam was performed according to the departmental dose-optimization program which includes automated exposure control, adjustment of the mA and/or kV according to patient size and/or use of iterative reconstruction technique. CONTRAST:  75mL OMNIPAQUE IOHEXOL 350 MG/ML SOLN COMPARISON:  02/25/2022 FINDINGS: Lower chest: No acute abnormality. Hepatobiliary: Hepatic steatosis. No focal hepatic lesions identified. Status post cholecystectomy. No biliary ductal dilatation. Pancreas: Unremarkable. No pancreatic ductal dilatation or surrounding inflammatory changes. Spleen: Normal in size without focal abnormality. Adrenals/Urinary Tract: Adrenal glands are unremarkable. Kidneys are normal, without renal calculi, focal lesion, or hydronephrosis. Bladder is unremarkable. Stomach/Bowel: Chronic thickening of the sigmoid colon likely on the basis of chronic diverticular disease. However, the thickening appears somewhat more prominent without overt surrounding acute inflammation and there are some more prominent  pericolonic lymph nodes in the adjacent mesentery measuring up to 7 mm. Subtle acute diverticulitis would be difficult to exclude. Eventual correlation with colonoscopy recommended to exclude underlying neoplasm. No extraluminal air, abscess or associated bowel obstruction. No free intraperitoneal air. Vascular/Lymphatic: Atherosclerosis of the abdominal aorta without aneurysm. No lymphadenopathy. Reproductive: Uterus and bilateral adnexa are unremarkable. Other: No abdominal wall hernia or abnormality. No abdominopelvic ascites. Musculoskeletal: No acute or significant osseous findings. IMPRESSION: 1. Chronic thickening of the sigmoid colon likely on the basis of chronic diverticular disease. However, the thickening appears somewhat more prominent without overt surrounding acute inflammation and there are some more prominent pericolonic lymph nodes in the adjacent mesentery measuring up to 7 mm. Subtle acute diverticulitis would be difficult to exclude. Eventual correlation with colonoscopy recommended to exclude underlying neoplasm. 2. Hepatic steatosis. 3.  Aortic atherosclerosis. Aortic Atherosclerosis (ICD10-I70.0). Electronically Signed   By: Irish Lack M.D.   On: 04/30/2023 19:18    Procedures Procedures    Medications Ordered in ED Medications  ciprofloxacin (CIPRO) tablet 500 mg (has no administration in time range)  metroNIDAZOLE (FLAGYL) tablet 500 mg (has no administration in time range)  morphine (PF) 4 MG/ML injection 4 mg (4 mg Intravenous Given 04/30/23 1713)  ondansetron (ZOFRAN) injection 4 mg (4 mg Intravenous Given 04/30/23 1713)  iohexol (OMNIPAQUE) 350 MG/ML injection 75 mL (75 mLs Intravenous Contrast Given 04/30/23 1852)    ED Course/ Medical Decision Making/ A&P                                 Medical Decision Making Obese adult female with history of diverticulitis as well as colonic polyps presents with left lower quadrant abdominal pain nausea, vomiting, diarrhea.   Differential including diverticulitis, mass, bleeding, versus urinary tract infection. Morphine provided CT ordered, labs sent from triage. Cardiac 70 sinus normal Pulse ox 100% room air normal   Amount and/or Complexity of Data Reviewed External Data Reviewed: notes. Labs: ordered. Decision-making details documented in ED Course. Radiology: ordered and independent interpretation performed. Decision-making details documented in ED Course.  Risk Prescription drug management. Decision regarding hospitalization. Diagnosis or treatment significantly limited by social determinants of health.   8:13 PM On repeat exam the patient is awake, alert, hemodynamically unremarkable aside from mild hypertension.  Labs reviewed, CT reviewed, findings consistent with mild diverticulitis.  With her history of abnormal colonoscopy, patient will follow-up with her GI doc.  However, no evidence for bacteremia, sepsis, or other acute findings, patient comfortable with, appropriate for outpatient therapy after initiation here with antibiotics.         Final Clinical Impression(s) / ED Diagnoses Final diagnoses:  Acute diverticulitis    Rx / DC Orders ED Discharge Orders          Ordered    ciprofloxacin (CIPRO) 500 MG tablet  2 times daily        04/30/23 2013    metroNIDAZOLE (FLAGYL) 500 MG tablet  2 times daily        04/30/23 2013    ondansetron (ZOFRAN-ODT) 4 MG disintegrating tablet  Every 8 hours PRN        04/30/23 2013    senna-docusate (SENOKOT-S) 8.6-50 MG tablet  Daily        04/30/23 2013    HYDROcodone-acetaminophen (NORCO/VICODIN) 5-325 MG tablet  Every 6 hours PRN        04/30/23 2013              Gerhard Munch, MD 04/30/23 2013

## 2023-05-27 ENCOUNTER — Other Ambulatory Visit (HOSPITAL_COMMUNITY): Payer: Self-pay

## 2023-05-27 ENCOUNTER — Encounter: Payer: Self-pay | Admitting: Internal Medicine

## 2023-05-27 ENCOUNTER — Ambulatory Visit: Payer: 59 | Attending: Internal Medicine | Admitting: Internal Medicine

## 2023-05-27 VITALS — BP 147/83 | HR 72 | Wt 239.6 lb

## 2023-05-27 DIAGNOSIS — K5792 Diverticulitis of intestine, part unspecified, without perforation or abscess without bleeding: Secondary | ICD-10-CM | POA: Diagnosis not present

## 2023-05-27 DIAGNOSIS — R1030 Lower abdominal pain, unspecified: Secondary | ICD-10-CM | POA: Diagnosis not present

## 2023-05-27 DIAGNOSIS — R739 Hyperglycemia, unspecified: Secondary | ICD-10-CM | POA: Diagnosis not present

## 2023-05-27 DIAGNOSIS — E66812 Obesity, class 2: Secondary | ICD-10-CM | POA: Diagnosis not present

## 2023-05-27 DIAGNOSIS — Z6837 Body mass index (BMI) 37.0-37.9, adult: Secondary | ICD-10-CM | POA: Diagnosis not present

## 2023-05-27 DIAGNOSIS — Z23 Encounter for immunization: Secondary | ICD-10-CM

## 2023-05-27 DIAGNOSIS — Z87891 Personal history of nicotine dependence: Secondary | ICD-10-CM

## 2023-05-27 DIAGNOSIS — F320 Major depressive disorder, single episode, mild: Secondary | ICD-10-CM | POA: Insufficient documentation

## 2023-05-27 DIAGNOSIS — I10 Essential (primary) hypertension: Secondary | ICD-10-CM

## 2023-05-27 DIAGNOSIS — Z1211 Encounter for screening for malignant neoplasm of colon: Secondary | ICD-10-CM

## 2023-05-27 MED ORDER — AMLODIPINE BESYLATE 5 MG PO TABS
5.0000 mg | ORAL_TABLET | Freq: Every day | ORAL | 1 refills | Status: DC
  Filled 2023-05-27: qty 90, 90d supply, fill #0
  Filled 2023-08-29: qty 90, 90d supply, fill #1

## 2023-05-27 MED ORDER — METRONIDAZOLE 500 MG PO TABS
500.0000 mg | ORAL_TABLET | Freq: Two times a day (BID) | ORAL | 0 refills | Status: DC
  Filled 2023-05-27: qty 10, 5d supply, fill #0

## 2023-05-27 MED ORDER — CIPROFLOXACIN HCL 500 MG PO TABS
500.0000 mg | ORAL_TABLET | Freq: Two times a day (BID) | ORAL | 0 refills | Status: DC
  Filled 2023-05-27: qty 10, 5d supply, fill #0

## 2023-05-27 MED ORDER — TRAMADOL HCL 50 MG PO TABS
50.0000 mg | ORAL_TABLET | Freq: Two times a day (BID) | ORAL | 0 refills | Status: AC | PRN
  Filled 2023-05-27: qty 10, 5d supply, fill #0

## 2023-05-27 NOTE — Patient Instructions (Signed)
Your blood pressure is elevated.  Stop ibuprofen.  Try to limit salt in the foods.  We have started a medication called amlodipine 5 mg daily to help lower the blood pressure.  I have prescribed antibiotics called ciprofloxacin and metronidazole for the diverticulitis.  I have given a short course of pain medication called tramadol to use as needed.  Healthy Eating, Adult Healthy eating may help you get and keep a healthy body weight, reduce the risk of chronic disease, and live a long and productive life. It is important to follow a healthy eating pattern. Your nutritional and calorie needs should be met mainly by different nutrient-rich foods. What are tips for following this plan? Reading food labels Read labels and choose the following: Reduced or low sodium products. Juices with 100% fruit juice. Foods with low saturated fats (<3 g per serving) and high polyunsaturated and monounsaturated fats. Foods with whole grains, such as whole wheat, cracked wheat, brown rice, and wild rice. Whole grains that are fortified with folic acid. This is recommended for females who are pregnant or who want to become pregnant. Read labels and do not eat or drink the following: Foods or drinks with added sugars. These include foods that contain brown sugar, corn sweetener, corn syrup, dextrose, fructose, glucose, high-fructose corn syrup, honey, invert sugar, lactose, malt syrup, maltose, molasses, raw sugar, sucrose, trehalose, or turbinado sugar. Limit your intake of added sugars to less than 10% of your total daily calories. Do not eat more than the following amounts of added sugar per day: 6 teaspoons (25 g) for females. 9 teaspoons (38 g) for males. Foods that contain processed or refined starches and grains. Refined grain products, such as white flour, degermed cornmeal, white bread, and white rice. Shopping Choose nutrient-rich snacks, such as vegetables, whole fruits, and nuts. Avoid high-calorie and  high-sugar snacks, such as potato chips, fruit snacks, and candy. Use oil-based dressings and spreads on foods instead of solid fats such as butter, margarine, sour cream, or cream cheese. Limit pre-made sauces, mixes, and "instant" products such as flavored rice, instant noodles, and ready-made pasta. Try more plant-protein sources, such as tofu, tempeh, black beans, edamame, lentils, nuts, and seeds. Explore eating plans such as the Mediterranean diet or vegetarian diet. Try heart-healthy dips made with beans and healthy fats like hummus and guacamole. Vegetables go great with these. Cooking Use oil to saut or stir-fry foods instead of solid fats such as butter, margarine, or lard. Try baking, boiling, grilling, or broiling instead of frying. Remove the fatty part of meats before cooking. Steam vegetables in water or broth. Meal planning  At meals, imagine dividing your plate into fourths: One-half of your plate is fruits and vegetables. One-fourth of your plate is whole grains. One-fourth of your plate is protein, especially lean meats, poultry, eggs, tofu, beans, or nuts. Include low-fat dairy as part of your daily diet. Lifestyle Choose healthy options in all settings, including home, work, school, restaurants, or stores. Prepare your food safely: Wash your hands after handling raw meats. Where you prepare food, keep surfaces clean by regularly washing with hot, soapy water. Keep raw meats separate from ready-to-eat foods, such as fruits and vegetables. Cook seafood, meat, poultry, and eggs to the recommended temperature. Get a food thermometer. Store foods at safe temperatures. In general: Keep cold foods at 70F (4.4C) or below. Keep hot foods at 170F (60C) or above. Keep your freezer at [facility] (-17.8C) or below. Foods are not safe to eat if  they have been between the temperatures of 40-140F (4.4-60C) for more than 2 hours. What foods should I eat? Fruits Aim to eat 1-2  cups of fresh, canned (in natural juice), or frozen fruits each day. One cup of fruit equals 1 small apple, 1 large banana, 8 large strawberries, 1 cup (237 g) canned fruit,  cup (82 g) dried fruit, or 1 cup (240 mL) 100% juice. Vegetables Aim to eat 2-4 cups of fresh and frozen vegetables each day, including different varieties and colors. One cup of vegetables equals 1 cup (91 g) broccoli or cauliflower florets, 2 medium carrots, 2 cups (150 g) raw, leafy greens, 1 large tomato, 1 large bell pepper, 1 large sweet potato, or 1 medium white potato. Grains Aim to eat 5-10 ounce-equivalents of whole grains each day. Examples of 1 ounce-equivalent of grains include 1 slice of bread, 1 cup (40 g) ready-to-eat cereal, 3 cups (24 g) popcorn, or  cup (93 g) cooked rice. Meats and other proteins Try to eat 5-7 ounce-equivalents of protein each day. Examples of 1 ounce-equivalent of protein include 1 egg,  oz nuts (12 almonds, 24 pistachios, or 7 walnut halves), 1/4 cup (90 g) cooked beans, 6 tablespoons (90 g) hummus or 1 tablespoon (16 g) peanut butter. A cut of meat or fish that is the size of a deck of cards is about 3-4 ounce-equivalents (85 g). Of the protein you eat each week, try to have at least 8 sounce (227 g) of seafood. This is about 2 servings per week. This includes salmon, trout, herring, sardines, and anchovies. Dairy Aim to eat 3 cup-equivalents of fat-free or low-fat dairy each day. Examples of 1 cup-equivalent of dairy include 1 cup (240 mL) milk, 8 ounces (250 g) yogurt, 1 ounces (44 g) natural cheese, or 1 cup (240 mL) fortified soy milk. Fats and oils Aim for about 5 teaspoons (21 g) of fats and oils per day. Choose monounsaturated fats, such as canola and olive oils, mayonnaise made with olive oil or avocado oil, avocados, peanut butter, and most nuts, or polyunsaturated fats, such as sunflower, corn, and soybean oils, walnuts, pine nuts, sesame seeds, sunflower seeds, and  flaxseed. Beverages Aim for 6 eight-ounce glasses of water per day. Limit coffee to 3-5 eight-ounce cups per day. Limit caffeinated beverages that have added calories, such as soda and energy drinks. If you drink alcohol: Limit how much you have to: 0-1 drink a day if you are female. 0-2 drinks a day if you are female. Know how much alcohol is in your drink. In the [place] one drink is one 12 oz bottle of beer (355 mL), one 5 oz glass of wine (148 mL), or one 1 oz glass of hard liquor (44 mL). Seasoning and other foods Try not to add too much salt to your food. Try using herbs and spices instead of salt. Try not to add sugar to food. This information is based on [place] nutrition guidelines. To learn more, visit [employer]. Exact amounts may vary. You may need different amounts. This information is not intended to replace advice given to you by your health care provider. Make sure you discuss any questions you have with your health care provider. Document Revised: 04/09/2022 Document Reviewed: 04/09/2022 Elsevier Patient Education  2024 [employer].

## 2023-05-27 NOTE — Progress Notes (Signed)
Patient ID: Christina Cannon, female    DOB: 1966/02/01  MRN: [id number]  CC: Hospitalization Follow-up   Subjective: Christina Cannon is a 57 y.o. female who presents for chronic ds management.  Last seen 06/2022. Her concerns today include:  Pt with hx of tob dep, diverticulosis, obese, OA knees/ankles, diverticulitis   Patient was seen in the emergency room 04/30/2023 with abdominal pain, nausea and vomiting.  She was assessed to have possible diverticulitis.   CAT scan of the abdomen: IMPRESSION: 1. Chronic thickening of the sigmoid colon likely on the basis of chronic diverticular disease. However, the thickening appears somewhat more prominent without overt surrounding acute inflammation and there are some more prominent pericolonic lymph nodes in the adjacent mesentery measuring up to 7 mm. Subtle acute diverticulitis would be difficult to exclude. Eventual correlation with colonoscopy recommended to exclude underlying neoplasm. 2. Hepatic steatosis. 3. Aortic atherosclerosis.  Given Cipro and Flagyl which she has completed.  Better but a dull ache that remains across lower abdomin.  Takes Tylenol or Ibuprofen 800 mg once a day.  No dysuria, no abnormal vaginal discharge.  Initially pain was greater than 10.  Now it is 5 out of 10.  Out of work from 10/8-13/2024. Moving bowel about once a wk.  Loose, no blood in stools.  Has Miralax to use PRN. 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  Returned on 05/06/2023 She has changed her diet, eating less red meat, eating more fish and green leafy veggies. Drinks sodas but cut back to 2 a day.  Also drinks sweet tea.  Gained wgh after she quit smoking 1 year ago- gained 25 lbs Due for repeat colonoscopy  BP is elev again today.  Checks BP regularly.  Reports readings 140-150s/70-80s  She has remained tobacco free.  No issues with depression. Patient Active Problem List   Diagnosis  Date Noted   Polyp of colon 07/19/2022   Prolonged QT interval 12/21/2021   Colitis 12/21/2021   Diverticulitis 12/20/2021   Hypokalemia 12/20/2021   Obesity (BMI 30-39.9) 12/20/2021   Tobacco use 12/20/2021   History of colonic diverticulitis 07/09/2017   Primary osteoarthritis of both knees 07/09/2017   Primary osteoarthritis of both ankles 07/09/2017   Tobacco dependence 07/09/2017   Elevated blood pressure reading 07/09/2017   Leukocytosis 06/10/2017   Skin tag of vulva 08/10/2015     Current Outpatient Medications on File Prior to Visit  Medication Sig Dispense Refill   diclofenac Sodium (VOLTAREN) 1 % GEL Apply 2 g topically 4 (four) times daily. (Patient not taking: Reported on 05/27/2023) 100 g 1   No current facility-administered medications on file prior to visit.    Allergies  Allergen Reactions   Ceftin [Cefuroxime Axetil] Anaphylaxis and Swelling    Throat and face swell. Pt stated she had a regimen in the hospital recently(2018) and did just fine.    Social History   Socioeconomic History   Marital status: Widowed    Spouse name: Not on file   Number of children: Not on file   Years of education: Not on file   Highest education level: Some college, no degree  Occupational History   Not on file  Tobacco Use   Smoking status: Some Days    Current packs/day: 0.00    Types: Cigarettes    Last attempt to quit: 11/2021    Years since quitting: 1.5   Smokeless tobacco: Never   Tobacco  comments:    Former occasional smoker started around age 23,   Vaping Use   Vaping status: Never Used  Substance and Sexual Activity   Alcohol use: Never   Drug use: Not Currently    Types: Marijuana    Comment: Prior use   Sexual activity: Yes    Birth control/protection: None, Post-menopausal  Other Topics Concern   Not on file  Social History Narrative   Not on file   Social Determinants of Health   Financial Resource Strain: Low Risk  (11/01/2022)   Overall  Financial Resource Strain (CARDIA)    Difficulty of Paying Living Expenses: Not very hard  Food Insecurity: Food Insecurity Present (11/01/2022)   Hunger Vital Sign    Worried About Running Out of Food in the Last Year: Sometimes true    Ran Out of Food in the Last Year: Sometimes true  Transportation Needs: Unmet Transportation Needs (11/01/2022)   PRAPARE - Administrator, Civil Service (Medical): Yes    Lack of Transportation (Non-Medical): No  Physical Activity: Insufficiently Active (11/01/2022)   Exercise Vital Sign    Days of Exercise per Week: 6 days    Minutes of Exercise per Session: 20 min  Stress: No Stress Concern Present (11/01/2022)   [employer] of Occupational Health - Occupational Stress Questionnaire    Feeling of Stress : Only a little  Social Connections: Moderately Isolated (11/01/2022)   Social Connection and Isolation Panel [NHANES]    Frequency of Communication with Friends and Family: More than three times a week    Frequency of Social Gatherings with Friends and Family: Twice a week    Attends Religious Services: More than 4 times per year    Active Member of [employer] or Organizations: No    Attends Banker Meetings: Not on file    Marital Status: Widowed  Intimate Partner Violence: Not At Risk (09/05/2017)   Humiliation, Afraid, Rape, and Kick questionnaire    Fear of Current or Ex-Partner: No    Emotionally Abused: No    Physically Abused: No    Sexually Abused: No    Family History  Problem Relation Age of Onset   Diabetes Mother    Hypertension Mother    Diabetes Father    Hypertension Father    Heart failure Father    Colon cancer Neg Hx    Colon polyps Neg Hx    Esophageal cancer Neg Hx    Rectal cancer Neg Hx    Stomach cancer Neg Hx     Past Surgical History:  Procedure Laterality Date   BIOPSY  07/19/2022   Procedure: BIOPSY;  Surgeon: Jenel Lucks, MD;  Location: [facility];  Service:  Gastroenterology;;   CHOLECYSTECTOMY  1988   COLONOSCOPY WITH PROPOFOL N/A 07/19/2022   Procedure: COLONOSCOPY WITH PROPOFOL;  Surgeon: Jenel Lucks, MD;  Location: [facility];  Service: Gastroenterology;  Laterality: N/A;   HAND SURGERY  1997   POLYPECTOMY N/A 07/19/2022   Procedure: POLYPECTOMY;  Surgeon: Jenel Lucks, MD;  Location: [facility];  Service: Gastroenterology;  Laterality: N/A;    ROS: Review of Systems Negative except as stated above  PHYSICAL EXAM: BP (!) 147/83 (BP Location: Left Arm, Patient Position: Sitting, Cuff Size: Large)   Pulse 72   Wt 239 lb 9.6 oz (108.7 kg)   LMP 05/29/2019   SpO2 96%   BMI 37.53 kg/m   Wt Readings from Last 3 Encounters:  05/27/23 239  lb 9.6 oz (108.7 kg)  04/30/23 208 lb (94.3 kg)  07/19/22 214 lb (97.1 kg)    Physical Exam  General appearance - alert, well appearing, older African-American female and in no distress Mental status - normal mood, behavior, speech, dress, motor activity, and thought processes Neck - supple, no significant adenopathy Chest - clear to auscultation, no wheezes, rales or rhonchi, symmetric air entry Heart - normal rate, regular rhythm, normal S1, S2, no murmurs, rubs, clicks or gallops Abdomen: Nondistended, soft, normal bowel sounds.  Mild tenderness across the lower abdomen left greater than right without guarding or rebound. Extremities - peripheral pulses normal, no pedal edema, no clubbing or cyanosis     07/03/2022   10:11 AM 03/02/2022   10:20 AM 10/08/2017   11:32 AM  Depression screen PHQ 2/9  Decreased Interest 0 2 0  Down, Depressed, Hopeless 0 2 1  PHQ - 2 Score 0 4 1  Altered sleeping 2 3   Tired, decreased energy 2 3   Change in appetite 2 3   Feeling bad or failure about yourself  0 0   Trouble concentrating 0 0   Moving slowly or fidgety/restless 0 0   Suicidal thoughts 0 0   PHQ-9 Score 6 13        Latest Ref Rng & Units 04/30/2023   12:55 PM 02/25/2022     2:21 PM 12/24/2021    3:34 AM  CMP  Glucose 70 - 99 mg/dL 914  96  89   BUN 6 - 20 mg/dL 11  13  11    Creatinine 0.44 - 1.00 mg/dL 7.82  9.56  2.13   Sodium 135 - 145 mmol/L 137  138  138   Potassium 3.5 - 5.1 mmol/L 3.4  3.1  3.6   Chloride 98 - 111 mmol/L 105  104  110   CO2 22 - 32 mmol/L 20  25  22    Calcium 8.9 - 10.3 mg/dL 8.8  8.6  8.7   Total Protein 6.5 - 8.1 g/dL 7.1  8.0    Total Bilirubin 0.3 - 1.2 mg/dL 1.0  0.5    Alkaline Phos 38 - 126 U/L 64  56    AST 15 - 41 U/L 19  23    ALT 0 - 44 U/L 18  20     Lipid Panel  No results found for: "CHOL", "TRIG", "HDL", "CHOLHDL", "VLDL", "LDLCALC", "LDLDIRECT"  CBC    Component Value Date/Time   WBC 11.0 (H) 04/30/2023 1255   RBC 4.95 04/30/2023 1255   HGB 14.3 04/30/2023 1255   HCT 42.6 04/30/2023 1255   PLT 213 04/30/2023 1255   MCV 86.1 04/30/2023 1255   MCH 28.9 04/30/2023 1255   MCHC 33.6 04/30/2023 1255   RDW 13.1 04/30/2023 1255   LYMPHSABS 2.6 12/24/2021 0334   MONOABS 0.7 12/24/2021 0334   EOSABS 0.1 12/24/2021 0334   BASOSABS 0.0 12/24/2021 0334    ASSESSMENT AND PLAN: 1. Lower abdominal pain Improved but still present.  Will give another course of Flagyl with Cipro and get her in with GI.  About due for her next c-scope as well - Ambulatory referral to Gastroenterology - CBC With Diff/Platelet - ciprofloxacin (CIPRO) 500 MG tablet; Take 1 tablet (500 mg total) by mouth 2 (two) times daily.  Dispense: 10 tablet; Refill: 0 - metroNIDAZOLE (FLAGYL) 500 MG tablet; Take 1 tablet (500 mg total) by mouth 2 (two) times daily.  Dispense: 10 tablet; Refill:  0 - traMADol (ULTRAM) 50 MG tablet; Take 1 tablet (50 mg total) by mouth every 12 (twelve) hours as needed for up to 5 days.  Dispense: 10 tablet; Refill: 0  2. Diverticulitis See #1 above - Ambulatory referral to Gastroenterology  3. Essential hypertension DASH discussed.  Patient reports elevated blood pressure even at home. Start Norvasc.  Goal is  130/80 or lower. - Basic Metabolic Panel - amLODipine (NORVASC) 5 MG tablet; Take 1 tablet (5 mg total) by mouth daily.  Dispense: 90 tablet; Refill: 1  4. Class 2 severe obesity due to excess calories with serious comorbidity and body mass index (BMI) of 37.0 to 37.9 in adult [facility]) Patient advised to eliminate sugary drinks from the diet, cut back on portion sizes especially of white carbohydrates, eat more white lean meat like chicken [place] and seafood instead of beef or pork and incorporate fresh fruits and vegetables into the diet daily. Strongly advised her to eliminate sugary drinks from her diet.  She declines referral to nutritionist - Hemoglobin A1c  5. Former smoker Commended her on remaining tobacco free  6. Hyperglycemia Blood sugar elevated on chemistry from ER.  We will screen for diabetes given 25 pound weight gain over the past year. - Hemoglobin A1c  7. Hypocalcemia - VITAMIN D 25 Hydroxy (Vit-D Deficiency, Fractures)  8. Need for shingles vaccine Due for second Shingrix vaccine but we were out of it today.  Will plan to give it on next visit.  9. Screening for colon cancer - Ambulatory referral to Gastroenterology     Patient was given the opportunity to ask questions.  Patient verbalized understanding of the plan and was able to repeat key elements of the plan.   This documentation was completed using Paediatric nurse.  Any transcriptional errors are unintentional.  No orders of the defined types were placed in this encounter.    Requested Prescriptions    No prescriptions requested or ordered in this encounter    No follow-ups on file.  Jonah Blue, MD, FACP

## 2023-05-28 ENCOUNTER — Other Ambulatory Visit: Payer: Self-pay | Admitting: Internal Medicine

## 2023-05-28 ENCOUNTER — Other Ambulatory Visit (HOSPITAL_COMMUNITY): Payer: Self-pay

## 2023-05-28 DIAGNOSIS — R7303 Prediabetes: Secondary | ICD-10-CM | POA: Insufficient documentation

## 2023-05-28 DIAGNOSIS — E559 Vitamin D deficiency, unspecified: Secondary | ICD-10-CM | POA: Insufficient documentation

## 2023-05-28 LAB — CBC WITH DIFF/PLATELET
Basophils Absolute: 0 10*3/uL (ref 0.0–0.2)
Basos: 0 %
EOS (ABSOLUTE): 0.1 10*3/uL (ref 0.0–0.4)
Eos: 1 %
Hematocrit: 45.3 % (ref 34.0–46.6)
Hemoglobin: 14.7 g/dL (ref 11.1–15.9)
Immature Grans (Abs): 0.1 10*3/uL (ref 0.0–0.1)
Immature Granulocytes: 1 %
Lymphocytes Absolute: 2.8 10*3/uL (ref 0.7–3.1)
Lymphs: 25 %
MCH: 28.3 pg (ref 26.6–33.0)
MCHC: 32.5 g/dL (ref 31.5–35.7)
MCV: 87 fL (ref 79–97)
Monocytes Absolute: 0.8 10*3/uL (ref 0.1–0.9)
Monocytes: 7 %
Neutrophils Absolute: 7.4 10*3/uL — ABNORMAL HIGH (ref 1.4–7.0)
Neutrophils: 66 %
Platelets: 215 10*3/uL (ref 150–450)
RBC: 5.2 x10E6/uL (ref 3.77–5.28)
RDW: 12.1 % (ref 11.7–15.4)
WBC: 11.1 10*3/uL — ABNORMAL HIGH (ref 3.4–10.8)

## 2023-05-28 LAB — BASIC METABOLIC PANEL
BUN/Creatinine Ratio: 13 (ref 9–23)
BUN: 14 mg/dL (ref 6–24)
CO2: 23 mmol/L (ref 20–29)
Calcium: 9.5 mg/dL (ref 8.7–10.2)
Chloride: 104 mmol/L (ref 96–106)
Creatinine, Ser: 1.08 mg/dL — ABNORMAL HIGH (ref 0.57–1.00)
Glucose: 106 mg/dL — ABNORMAL HIGH (ref 70–99)
Potassium: 4.2 mmol/L (ref 3.5–5.2)
Sodium: 140 mmol/L (ref 134–144)
eGFR: 60 mL/min/{1.73_m2} (ref >59)

## 2023-05-28 LAB — HEMOGLOBIN A1C
Est. average glucose Bld gHb Est-mCnc: 128 mg/dL
Hgb A1c MFr Bld: 6.1 % — ABNORMAL HIGH (ref 4.8–5.6)

## 2023-05-28 LAB — VITAMIN D 25 HYDROXY (VIT D DEFICIENCY, FRACTURES): Vit D, 25-Hydroxy: 8.7 ng/mL — ABNORMAL LOW (ref 30.0–100.0)

## 2023-05-28 MED ORDER — VITAMIN D (ERGOCALCIFEROL) 1.25 MG (50000 UNIT) PO CAPS
50000.0000 [IU] | ORAL_CAPSULE | ORAL | 1 refills | Status: DC
  Filled 2023-05-28: qty 12, 84d supply, fill #0
  Filled 2023-08-29: qty 12, 84d supply, fill #1

## 2023-05-28 NOTE — Progress Notes (Signed)
Let patient know that her vitamin D level is very low.  I recommend taking vitamin D supplement once a week.  I have sent the prescription to her pharmacy. She has prediabetes.  Healthy eating habits and regular exercise as discussed on recent visit will help prevent progression to full diabetes. Potassium level has normalized. Has mild elevation in white blood cell count that is stable compared to 1 month ago.  Complete the antibiotics that were prescribed.

## 2023-05-29 ENCOUNTER — Telehealth: Payer: Self-pay | Admitting: Internal Medicine

## 2023-05-29 NOTE — Telephone Encounter (Signed)
PC placed to Ms. Christina Cannon, Workforce Accommodations Specialist with [employer] [phone] ext [phone].  Called and left VMM letting her know that pt's form for Accommodation event will be faxed off tomorrow.

## 2023-06-28 ENCOUNTER — Ambulatory Visit: Payer: 59 | Admitting: Pharmacist

## 2023-07-29 ENCOUNTER — Ambulatory Visit: Payer: 59 | Admitting: Gastroenterology

## 2023-08-15 ENCOUNTER — Ambulatory Visit: Payer: Self-pay | Admitting: Internal Medicine

## 2023-08-15 NOTE — Telephone Encounter (Signed)
Copied from CRM [id number]. Topic: Clinical - Red Word Triage >> Aug 15, 2023  1:11 PM Desma Mcgregor wrote: Red Word that prompted transfer to Nurse Triage: Dizziness and passing out at work today and last week. Shortness of breath and chest aches.   Chief Complaint: fainting Symptoms: chest pain and shortness of breath during 1 episode Saturday Frequency: intermittently since Saturday Pertinent Negatives: Patient denies shortness of breath or chest pain at time of triage Disposition: [x] ED /[] Urgent Care (no appt availability in office) / [] Appointment(In office/virtual)/ []  [facility]/ [] Home Care/ [] Refused Recommended Disposition /[] [facility]/ []  Follow-up with PCP Additional Notes: The patient reported 3 instances of fainting.  The first time was Saturday when she was out at a store.  She had only eaten 1 small meal from [facility].  When she woke up after a few seconds, she immediately threw up and felt a tightness in the left side of her chest up into her neck and a muscle spasm in her stomach and some shortness of breath.  Yesterday, she fainted again, but did not experienced chest pain or shortness of breath.  Today she fainted again at work and denied the chest pain and shortness of breath and she did not vomit.  She reported that she has been trying to watch her diet and has not been eating until she arrives at work.  She believes she is fainting due to not eating. She was advised to go to the ED for further assessment.  She was agreeable. Reason for Disposition  [1] Age > 50 years  AND [2] now alert and feels fine  Answer Assessment - Initial Assessment Questions 1. ONSET: "How long were you unconscious?" (minutes) "When did it happen?"     Could have been more than a few seconds  2. CONTENT: "What happened during period of unconsciousness?" (e.g., seizure activity)      Vision got blurry  3. MENTAL STATUS: "Alert and oriented now?" (oriented x 3 = name, month, location)       Alert and oriented  4. TRIGGER: "What do you think caused the fainting?" "What were you doing just before you fainted?"  (e.g., exercise, sudden standing up, prolonged standing)     When I eat a small amount  5. RECURRENT SYMPTOM: "Have you ever passed out before?" If Yes, ask: "When was the last time?" and "What happened that time?"      Saturday morning 6. INJURY: "Did you sustain any injury during the fall?"      No injuries  7. CARDIAC SYMPTOMS: "Have you had any of the following symptoms: chest pain, difficulty breathing, palpitations?"     Short of breath after fainting Chest pain radiated to back of neck - like sharp pain on left side and middle 2 minutes 8. NEUROLOGIC SYMPTOMS: "Have you had any of the following symptoms: headache, numbness, vertigo, weakness?"     Left arm numbness has been ongoing  9. GI SYMPTOMS: "Have you had any of the following symptoms: abdomen pain, vomiting, diarrhea, blood in stools?"     Vomited once after passing out Saturday  10. OTHER SYMPTOMS: "Do you have any other symptoms?"       Numbness in extremities when laying down feels numbness in arms  Protocols used: Fainting-A-AH

## 2023-08-29 ENCOUNTER — Other Ambulatory Visit (HOSPITAL_COMMUNITY): Payer: Self-pay

## 2023-09-02 ENCOUNTER — Other Ambulatory Visit: Payer: Self-pay | Admitting: Internal Medicine

## 2023-09-02 DIAGNOSIS — Z1231 Encounter for screening mammogram for malignant neoplasm of breast: Secondary | ICD-10-CM

## 2023-09-11 ENCOUNTER — Ambulatory Visit: Payer: Commercial Managed Care - PPO

## 2023-09-11 DIAGNOSIS — Z1231 Encounter for screening mammogram for malignant neoplasm of breast: Secondary | ICD-10-CM

## 2023-09-24 ENCOUNTER — Ambulatory Visit: Payer: Self-pay | Admitting: Internal Medicine

## 2023-10-30 ENCOUNTER — Emergency Department (HOSPITAL_COMMUNITY): Admission: EM | Admit: 2023-10-30 | Source: Home / Self Care

## 2023-10-31 ENCOUNTER — Emergency Department (HOSPITAL_COMMUNITY)

## 2023-10-31 ENCOUNTER — Other Ambulatory Visit: Payer: Self-pay

## 2023-10-31 ENCOUNTER — Other Ambulatory Visit (HOSPITAL_COMMUNITY): Payer: Self-pay

## 2023-10-31 ENCOUNTER — Emergency Department (HOSPITAL_COMMUNITY)
Admission: EM | Admit: 2023-10-31 | Discharge: 2023-10-31 | Disposition: A | Discharge status: Home / Self Care | Attending: Emergency Medicine | Admitting: Emergency Medicine

## 2023-10-31 DIAGNOSIS — Z79899 Other long term (current) drug therapy: Secondary | ICD-10-CM | POA: Diagnosis not present

## 2023-10-31 DIAGNOSIS — I10 Essential (primary) hypertension: Secondary | ICD-10-CM | POA: Insufficient documentation

## 2023-10-31 DIAGNOSIS — E860 Dehydration: Secondary | ICD-10-CM | POA: Diagnosis not present

## 2023-10-31 DIAGNOSIS — K760 Fatty (change of) liver, not elsewhere classified: Secondary | ICD-10-CM | POA: Diagnosis not present

## 2023-10-31 DIAGNOSIS — G43809 Other migraine, not intractable, without status migrainosus: Secondary | ICD-10-CM | POA: Diagnosis not present

## 2023-10-31 DIAGNOSIS — R519 Headache, unspecified: Secondary | ICD-10-CM | POA: Diagnosis not present

## 2023-10-31 DIAGNOSIS — K5750 Diverticulosis of both small and large intestine without perforation or abscess without bleeding: Secondary | ICD-10-CM | POA: Diagnosis not present

## 2023-10-31 DIAGNOSIS — G43811 Other migraine, intractable, with status migrainosus: Secondary | ICD-10-CM | POA: Insufficient documentation

## 2023-10-31 DIAGNOSIS — K5792 Diverticulitis of intestine, part unspecified, without perforation or abscess without bleeding: Secondary | ICD-10-CM | POA: Diagnosis not present

## 2023-10-31 DIAGNOSIS — R079 Chest pain, unspecified: Secondary | ICD-10-CM | POA: Diagnosis not present

## 2023-10-31 DIAGNOSIS — D251 Intramural leiomyoma of uterus: Secondary | ICD-10-CM | POA: Diagnosis not present

## 2023-10-31 DIAGNOSIS — R1032 Left lower quadrant pain: Secondary | ICD-10-CM | POA: Diagnosis not present

## 2023-10-31 LAB — COMPREHENSIVE METABOLIC PANEL WITH GFR
ALT: 15 U/L (ref 0–44)
AST: 19 U/L (ref 15–41)
Albumin: 3.4 g/dL — ABNORMAL LOW (ref 3.5–5.0)
Alkaline Phosphatase: 47 U/L (ref 38–126)
Anion gap: 12 (ref 5–15)
BUN: 10 mg/dL (ref 6–20)
CO2: 22 mmol/L (ref 22–32)
Calcium: 8.9 mg/dL (ref 8.9–10.3)
Chloride: 103 mmol/L (ref 98–111)
Creatinine, Ser: 0.9 mg/dL (ref 0.44–1.00)
GFR, Estimated: >60 mL/min (ref >60)
Glucose, Bld: 103 mg/dL — ABNORMAL HIGH (ref 70–99)
Potassium: 3.5 mmol/L (ref 3.5–5.1)
Sodium: 137 mmol/L (ref 135–145)
Total Bilirubin: 0.7 mg/dL (ref 0.0–1.2)
Total Protein: 6.9 g/dL (ref 6.5–8.1)

## 2023-10-31 LAB — URINALYSIS, ROUTINE W REFLEX MICROSCOPIC
Bilirubin Urine: NEGATIVE
Glucose, UA: NEGATIVE mg/dL
Ketones, ur: NEGATIVE mg/dL
Leukocytes,Ua: NEGATIVE
Nitrite: NEGATIVE
Protein, ur: NEGATIVE mg/dL
Specific Gravity, Urine: 1.016 (ref 1.005–1.030)
pH: 5 (ref 5.0–8.0)

## 2023-10-31 LAB — CBC
HCT: 42.3 % (ref 36.0–46.0)
Hemoglobin: 14.4 g/dL (ref 12.0–15.0)
MCH: 29 pg (ref 26.0–34.0)
MCHC: 34 g/dL (ref 30.0–36.0)
MCV: 85.1 fL (ref 80.0–100.0)
Platelets: 234 10*3/uL (ref 150–400)
RBC: 4.97 MIL/uL (ref 3.87–5.11)
RDW: 13.4 % (ref 11.5–15.5)
WBC: 11 10*3/uL — ABNORMAL HIGH (ref 4.0–10.5)
nRBC: 0 % (ref 0.0–0.2)

## 2023-10-31 LAB — TROPONIN I (HIGH SENSITIVITY)
Troponin I (High Sensitivity): 4 ng/L (ref <18)
Troponin I (High Sensitivity): 3 ng/L (ref <18)

## 2023-10-31 LAB — CK: Total CK: 179 U/L (ref 38–234)

## 2023-10-31 LAB — LIPASE, BLOOD: Lipase: 25 U/L (ref 11–51)

## 2023-10-31 MED ORDER — METOCLOPRAMIDE HCL 10 MG PO TABS
10.0000 mg | ORAL_TABLET | Freq: Four times a day (QID) | ORAL | 0 refills | Status: DC | PRN
  Filled 2023-10-31: qty 15, 4d supply, fill #0

## 2023-10-31 MED ORDER — LACTATED RINGERS IV BOLUS
1000.0000 mL | Freq: Once | INTRAVENOUS | Status: AC
  Administered 2023-10-31: 1000 mL via INTRAVENOUS

## 2023-10-31 MED ORDER — CIPROFLOXACIN HCL 500 MG PO TABS
500.0000 mg | ORAL_TABLET | Freq: Two times a day (BID) | ORAL | 0 refills | Status: DC
  Filled 2023-10-31: qty 14, 7d supply, fill #0

## 2023-10-31 MED ORDER — CIPROFLOXACIN HCL 500 MG PO TABS
500.0000 mg | ORAL_TABLET | Freq: Once | ORAL | Status: AC
  Administered 2023-10-31: 500 mg via ORAL
  Filled 2023-10-31: qty 1

## 2023-10-31 MED ORDER — HYDROCODONE-ACETAMINOPHEN 5-325 MG PO TABS
1.0000 | ORAL_TABLET | Freq: Four times a day (QID) | ORAL | 0 refills | Status: DC | PRN
Start: 2023-10-31 — End: 2023-11-14
  Filled 2023-10-31: qty 10, 3d supply, fill #0

## 2023-10-31 MED ORDER — MORPHINE SULFATE (PF) 4 MG/ML IV SOLN
4.0000 mg | Freq: Once | INTRAVENOUS | Status: AC
  Administered 2023-10-31: 4 mg via INTRAVENOUS
  Filled 2023-10-31: qty 1

## 2023-10-31 MED ORDER — METOCLOPRAMIDE HCL 5 MG/ML IJ SOLN
10.0000 mg | Freq: Once | INTRAMUSCULAR | Status: AC
  Administered 2023-10-31: 10 mg via INTRAVENOUS
  Filled 2023-10-31: qty 2

## 2023-10-31 MED ORDER — METRONIDAZOLE 500 MG PO TABS
500.0000 mg | ORAL_TABLET | Freq: Once | ORAL | Status: AC
  Administered 2023-10-31: 500 mg via ORAL
  Filled 2023-10-31: qty 1

## 2023-10-31 MED ORDER — METRONIDAZOLE 500 MG PO TABS
500.0000 mg | ORAL_TABLET | Freq: Two times a day (BID) | ORAL | 0 refills | Status: DC
  Filled 2023-10-31: qty 14, 7d supply, fill #0

## 2023-10-31 MED ORDER — IOHEXOL 350 MG/ML SOLN
75.0000 mL | Freq: Once | INTRAVENOUS | Status: AC | PRN
  Administered 2023-10-31: 75 mL via INTRAVENOUS

## 2023-10-31 NOTE — ED Triage Notes (Signed)
 Pt. Stated, Christina Cannon had stomach pain, N/V with muscle spasms and a headache on the left side. This started last Thursday. I was here last night and didn't feel like waiting.

## 2023-10-31 NOTE — ED Provider Notes (Signed)
 [place] EMERGENCY DEPARTMENT AT [facility] Provider Note   CSN: [id number] Arrival date & time: 10/31/23  0740     History  Chief Complaint  Patient presents with   Headache   Spasms   Abdominal Pain   Emesis   Nausea    Christina Cannon is a 58 y.o. female.  Pt is a 57y/o female with hx of hypertension, diverticulitis, multiple colon polyps, cholecystectomy who is presenting today with multiple symptoms going on for at least a week and have been gradually worsening.  She complains of pain on the left side of her body including head, chest and abdomen with ongoing diarrhea but now having pain all over worse on the left side.  Some mild shortness of breath and nausea.  No fevers but woke up this morning reporting blurry vision in her left eye, dizziness anytime she tries to stand and walk and weakness on the left side.  She feels like all of her muscles are spasming but seem to be worse on the left side.  She is stopped taking her blood pressure medicine 4 days ago because her blood pressure was getting too low it was a medication she had only started in January.  She has had multiple abdominal surgeries including diverticulitis and multiple polyps removed.  She denies any specific urinary symptoms.  She reports history of migraines but feels that this headache is not similar of her prior headaches.  She has not had any trauma or syncope.  No other new medications.  For oral intake.  She reports still feeling some blurriness in the left eye.  The history is provided by the patient and medical records.  Headache Associated symptoms: abdominal pain and vomiting   Abdominal Pain Associated symptoms: vomiting   Emesis Associated symptoms: abdominal pain and headaches        Home Medications Prior to Admission medications   Medication Sig Start Date End Date Taking? Authorizing Provider  amLODipine (NORVASC) 5 MG tablet Take 1 tablet (5 mg total) by mouth daily.  05/27/23   Marcine Matar, MD  ciprofloxacin (CIPRO) 500 MG tablet Take 1 tablet (500 mg total) by mouth every 12 (twelve) hours. 10/31/23  Yes Gwyneth Sprout, MD  diclofenac Sodium (VOLTAREN) 1 % GEL Apply 2 g topically 4 (four) times daily. Patient not taking: Reported on 05/27/2023 07/03/22   Marcine Matar, MD  HYDROcodone-acetaminophen (NORCO/VICODIN) 5-325 MG tablet Take 1 tablet by mouth every 6 (six) hours as needed for severe pain (pain score 7-10). 10/31/23  Yes Gwyneth Sprout, MD  metoCLOPramide (REGLAN) 10 MG tablet Take 1 tablet (10 mg total) by mouth every 6 (six) hours as needed for nausea. 10/31/23  Yes Gwyneth Sprout, MD  metroNIDAZOLE (FLAGYL) 500 MG tablet Take 1 tablet (500 mg total) by mouth 2 (two) times daily. 10/31/23  Yes Trinaty Bundrick, Alphonzo Lemmings, MD  Vitamin D, Ergocalciferol, (DRISDOL) 1.25 MG (50000 UNIT) CAPS capsule Take 1 capsule (50,000 Units total) by mouth every 7 (seven) days. 05/28/23   Marcine Matar, MD      Allergies    Ceftin [cefuroxime axetil]    Review of Systems   Review of Systems  Gastrointestinal:  Positive for abdominal pain and vomiting.  Neurological:  Positive for headaches.    Physical Exam Updated Vital Signs BP 111/69 (BP Location: Right Arm)   Pulse 62   Temp 97.7 F (36.5 C)   Resp 18   Ht 5\' 7"  (1.702 m)   Wt  98.9 kg   LMP 05/29/2019   SpO2 100%   BMI 34.14 kg/m  Physical Exam Vitals and nursing note reviewed.  Constitutional:      General: She is in acute distress.     Appearance: She is well-developed.  HENT:     Head: Normocephalic and atraumatic.     Mouth/Throat:     Mouth: Mucous membranes are dry.  Eyes:     Pupils: Pupils are equal, round, and reactive to light.  Neck:     Vascular: No carotid bruit.   Cardiovascular:     Rate and Rhythm: Normal rate and regular rhythm.     Heart sounds: Normal heart sounds. No murmur heard.    No friction rub.  Pulmonary:     Effort: Pulmonary effort is normal.      Breath sounds: Normal breath sounds. No wheezing or rales.    Chest:     Chest wall: Tenderness present.    Abdominal:     General: Bowel sounds are normal. There is no distension.     Palpations: Abdomen is soft.     Tenderness: There is abdominal tenderness in the left upper quadrant and left lower quadrant. There is no left CVA tenderness, guarding or rebound.  Musculoskeletal:        General: No tenderness. Normal range of motion.     Cervical back: Normal range of motion and neck supple.     Right lower leg: No edema.     Left lower leg: No edema.     Comments: No edema  Skin:    General: Skin is warm and dry.     Findings: No rash.  Neurological:     Mental Status: She is alert and oriented to person, place, and time.     Cranial Nerves: No cranial nerve deficit.     Sensory: No sensory deficit.     Coordination: Coordination is intact. Coordination normal.     Gait: Gait is intact. Gait normal.     Comments: No facial droop and EOMI.  No nystagmus.  5/5 strength in all ext except or minimal change in strength in the left leg.  Psychiatric:        Behavior: Behavior normal.     ED Results / Procedures / Treatments   Labs (all labs ordered are listed, but only abnormal results are displayed) Labs Reviewed  COMPREHENSIVE METABOLIC PANEL WITH GFR - Abnormal; Notable for the following components:      Result Value   Glucose, Bld 103 (*)    Albumin 3.4 (*)    All other components within normal limits  CBC - Abnormal; Notable for the following components:   WBC 11.0 (*)    All other components within normal limits  URINALYSIS, ROUTINE W REFLEX MICROSCOPIC - Abnormal; Notable for the following components:   Hgb urine dipstick MODERATE (*)    Bacteria, UA RARE (*)    All other components within normal limits  LIPASE, BLOOD  CK  TROPONIN I (HIGH SENSITIVITY)  TROPONIN I (HIGH SENSITIVITY)    EKG None  Radiology CT ABDOMEN PELVIS W CONTRAST Result Date:  10/31/2023 CLINICAL DATA:  LLQ abdominal pain EXAM: CT ABDOMEN AND PELVIS WITH CONTRAST TECHNIQUE: Multidetector CT imaging of the abdomen and pelvis was performed using the standard protocol following bolus administration of intravenous contrast. RADIATION DOSE REDUCTION: This exam was performed according to the departmental dose-optimization program which includes automated exposure control, adjustment of the mA and/or kV according  to patient size and/or use of iterative reconstruction technique. CONTRAST:  75mL OMNIPAQUE IOHEXOL 350 MG/ML SOLN COMPARISON:  April 30, 2023 FINDINGS: Lower chest: No focal airspace consolidation or pleural effusion. Hepatobiliary: No mass. Hepatic steatosis. No radiopaque stones or wall thickening of the gallbladder.No intrahepatic or extrahepatic biliary ductal dilation.The portal veins are patent. Pancreas: No mass or main ductal dilation.No peripancreatic inflammation or fluid collection. Spleen: Normal size. No mass. Adrenals/Urinary Tract: No adrenal masses. No renal mass. No nephrolithiasis or hydronephrosis. The urinary bladder is distended without focal abnormality. Stomach/Bowel: A small amount of fluid is present within the distal esophagus. The stomach contains ingested material without focal abnormality. There is a large 6 cm diverticulum arising from the third portion the duodenum. No small bowel wall thickening or inflammation. No small bowel obstruction. Normal appendix. Total colonic diverticulosis. Subtle lateral conal thickening along the sigmoid mesocolon without mesenteric inflammation. Vascular/Lymphatic: No aortic aneurysm. Diffuse aortoiliac atherosclerosis. Circumaortic left renal vein. No intraabdominal or pelvic lymphadenopathy. Reproductive: Intramural fibroid along the right uterine body measuring 2.7 cm. Both ovaries are otherwise within normal limits for patient's age.No free pelvic fluid. Other: No pneumoperitoneum or ascites. Musculoskeletal: No acute  fracture or destructive lesion.Multilevel degenerative disc disease of the spine. Mild bilateral hip osteoarthritis. IMPRESSION: 1. Subtle thickening of the lateral conal fascia along the sigmoid mesocolon without associated fat induration. This could represent chronic changes from prior diverticulitis. Alternatively, this could reflect changes of early acute diverticulitis. No pneumoperitoneum or peridiverticular abscess. 2. Small amount of fluid in the distal esophagus possibly due to incomplete emptying or gastroesophageal reflux. 3. Right uterine body fibroid measuring 2.7 cm. 4. Hepatic steatosis. Electronically Signed   By: Wallie Char M.D.   On: 10/31/2023 15:49   CT Head Wo Contrast Result Date: 10/31/2023 CLINICAL DATA:  New onset headache EXAM: CT HEAD WITHOUT CONTRAST TECHNIQUE: Contiguous axial images were obtained from the base of the skull through the vertex without intravenous contrast. RADIATION DOSE REDUCTION: This exam was performed according to the departmental dose-optimization program which includes automated exposure control, adjustment of the mA and/or kV according to patient size and/or use of iterative reconstruction technique. COMPARISON:  02/25/2022 FINDINGS: Brain: No acute infarct or hemorrhage. Lateral ventricles and midline structures are unremarkable. No acute extra-axial fluid collections. No mass effect. Vascular: No hyperdense vessel or unexpected calcification. Skull: Normal. Negative for fracture or focal lesion. Sinuses/Orbits: No acute finding. Other: None. IMPRESSION: 1. Stable head CT, no acute intracranial process. Electronically Signed   By: Sharlet Salina M.D.   On: 10/31/2023 15:32   DG Chest 2 View Result Date: 10/31/2023 CLINICAL DATA:  Pain on the left side. EXAM: CHEST - 2 VIEW COMPARISON:  Chest radiograph dated 02/25/2022. FINDINGS: The heart size and mediastinal contours are within normal limits. Both lungs are clear. No pleural effusion or pneumothorax.  Surgical clips in the right upper quadrant. Degenerative changes of the thoracic spine. No acute osseous abnormality. IMPRESSION: No acute cardiopulmonary findings. Electronically Signed   By: Hart Robinsons M.D.   On: 10/31/2023 10:20    Procedures Procedures    Medications Ordered in ED Medications  lactated ringers bolus 1,000 mL (0 mLs Intravenous Stopped 10/31/23 1539)  morphine (PF) 4 MG/ML injection 4 mg (4 mg Intravenous Given 10/31/23 1237)  metoCLOPramide (REGLAN) injection 10 mg (10 mg Intravenous Given 10/31/23 1231)  iohexol (OMNIPAQUE) 350 MG/ML injection 75 mL (75 mLs Intravenous Contrast Given 10/31/23 1332)  ciprofloxacin (CIPRO) tablet 500 mg (500 mg Oral Given  10/31/23 1614)  metroNIDAZOLE (FLAGYL) tablet 500 mg (500 mg Oral Given 10/31/23 1614)    ED Course/ Medical Decision Making/ A&P                                 Medical Decision Making Amount and/or Complexity of Data Reviewed External Data Reviewed: notes. Labs: ordered. Decision-making details documented in ED Course. Radiology: ordered and independent interpretation performed. Decision-making details documented in ED Course. ECG/medicine tests: ordered and independent interpretation performed. Decision-making details documented in ED Course.  Risk Prescription drug management.   Pt with multiple medical problems and comorbidities and presenting today with a complaint that caries a high risk for morbidity and mortality.  Today with multiple symptoms but concern for dehydration, AKI, diverticulitis, pneumonia, migraine headache, stroke.  Patient does complain of some left-sided symptoms but mostly complains of pain.  Possibility for electrolyte abnormality as she has not been eating and drinking and continues to have diarrhea.  Low suspicion for ACS, pneumothorax, urinary issues such as pyelonephritis.  Patient given pain control and IV fluids.  Vital signs are normal here. I independently interpreted patient's  labs and EKG and lipase within normal limits, UA without acute findings, CMP within normal limits and CBC with minimal leukocytosis of 11.  EKG without acute findings and similar to June 2023.  Normal QT interval of 406 and QTc of 430. I have independently visualized and interpreted pt's images today.  Chest x-ray without acute findings. 4:21 PM Head CT was negative for acute findings.  Radiologist reported subtle thickening of the lateral fascia in the sigmoid mesocolon associated with fat induration which could represent chronic changes from prior diverticulitis versus early acute diverticulitis but no abscess present.  Given patient's new symptoms of abdominal pain concern for a new infection starting.  Will cover patient with Cipro and Flagyl due to her allergy to Ceftin in the past.  She has tolerated these medications in the past.  Patient is improved after fluids and pain control.  At this time patient has no neurologic findings and feel that she is stable for discharge home.  Did have return precautions.  She is comfortable with this plan.  ED ECG REPORT   Date: 10/31/2023  Rate: 68  Rhythm: normal sinus rhythm  QRS Axis: normal  Intervals: normal  ST/T Wave abnormalities: nonspecific T wave changes  Conduction Disutrbances:none  Narrative Interpretation:   Old EKG Reviewed: unchanged  I have personally reviewed the EKG tracing and agree with the computerized printout as noted.         Final Clinical Impression(s) / ED Diagnoses Final diagnoses:  Dehydration  Other migraine with status migrainosus, intractable  Diverticulitis    Rx / DC Orders ED Discharge Orders          Ordered    ciprofloxacin (CIPRO) 500 MG tablet  Every 12 hours        10/31/23 1620    metroNIDAZOLE (FLAGYL) 500 MG tablet  2 times daily        10/31/23 1620    HYDROcodone-acetaminophen (NORCO/VICODIN) 5-325 MG tablet  Every 6 hours PRN        10/31/23 1620    metoCLOPramide (REGLAN) 10 MG  tablet  Every 6 hours PRN        10/31/23 1620              Gwyneth Sprout, MD 10/31/23 1621

## 2023-10-31 NOTE — ED Provider Triage Note (Signed)
 Emergency Medicine Provider Triage Evaluation Note  Christina Cannon , a 58 y.o. female  was evaluated in triage.  Pt complains of multiple symptoms going on for at least a week and have been gradually worsening.  She complains of pain on the left side of her body including head chest and abdomen with ongoing diarrhea but now having pain all over worse on the left side.  Some mild shortness of breath and nausea.  No fevers but woke up this morning reporting blurry vision in her left eye, dizziness anytime she tries to stand and walk and weakness on the left side.  She feels like all of her muscles are spasming but seem to be worse on the left side.  She is stopped taking her blood pressure medicine 4 days ago because her blood pressure was getting too low it was a medication she had only started in January.  She has had multiple abdominal surgeries including diverticulitis and multiple polyps removed.  She denies any specific urinary symptoms.  She reports history of migraines but feels that this headache is not similar of her prior headaches.  Review of Systems  Positive: Headache, left-sided abdominal pain, left-sided chest pain, shortness of breath, dizziness, left-sided complaints Negative: Fever, leg swelling  Physical Exam  BP 121/71 (BP Location: Right Arm)   Pulse 79   Temp 98.4 F (36.9 C)   Resp 16   Ht 5\' 7"  (1.702 m)   Wt 98.9 kg   LMP 05/29/2019   SpO2 96%   BMI 34.14 kg/m  Gen:   Awake, no distress   Resp:  Normal effort pain with palpation on the left side of the chest MSK/neuro:   Moves extremities without difficulty no noted pronator drift in the upper extremities.  Left leg seems slightly weaker than the right, no facial droop.  Extraocular movements are intact Other:  Abdomen with left lower quadrant pain but no rebound or guarding.  Medical Decision Making  Medically screening exam initiated at 9:19 AM.  Appropriate orders placed.  AZARYAH HEATHCOCK was informed  that the remainder of the evaluation will be completed by another provider, this initial triage assessment does not replace that evaluation, and the importance of remaining in the ED until their evaluation is complete.     Gwyneth Sprout, MD 10/31/23 [phone]

## 2023-10-31 NOTE — Discharge Instructions (Addendum)
 Overall all the blood work to your heart looked good today and your EKG.  The x-ray was normal and everything with your CAT scan of your brain was normal.  Your abdominal scan showed possibility of early diverticulitis and since you been having that pain we will treat with a course of antibiotics.  Make sure you are keeping up on your fluids and rest for the next few days.  Prescriptions were sent to your pharmacy.  However if your symptoms start getting worse you start having persistent vomiting, fever or the pain is worsening return to the emergency room.

## 2023-11-14 ENCOUNTER — Other Ambulatory Visit (HOSPITAL_COMMUNITY): Payer: Self-pay

## 2023-11-14 ENCOUNTER — Encounter: Payer: Self-pay | Admitting: Internal Medicine

## 2023-11-14 ENCOUNTER — Ambulatory Visit: Attending: Internal Medicine | Admitting: Internal Medicine

## 2023-11-14 VITALS — BP 150/88 | HR 62 | Temp 97.9°F | Ht 67.0 in | Wt 236.0 lb

## 2023-11-14 DIAGNOSIS — R197 Diarrhea, unspecified: Secondary | ICD-10-CM

## 2023-11-14 DIAGNOSIS — E559 Vitamin D deficiency, unspecified: Secondary | ICD-10-CM

## 2023-11-14 DIAGNOSIS — K5792 Diverticulitis of intestine, part unspecified, without perforation or abscess without bleeding: Secondary | ICD-10-CM

## 2023-11-14 DIAGNOSIS — R109 Unspecified abdominal pain: Secondary | ICD-10-CM | POA: Diagnosis not present

## 2023-11-14 DIAGNOSIS — M62838 Other muscle spasm: Secondary | ICD-10-CM | POA: Diagnosis not present

## 2023-11-14 DIAGNOSIS — Z9181 History of falling: Secondary | ICD-10-CM | POA: Diagnosis not present

## 2023-11-14 DIAGNOSIS — I10 Essential (primary) hypertension: Secondary | ICD-10-CM

## 2023-11-14 DIAGNOSIS — N950 Postmenopausal bleeding: Secondary | ICD-10-CM | POA: Diagnosis not present

## 2023-11-14 MED ORDER — METRONIDAZOLE 500 MG PO TABS
500.0000 mg | ORAL_TABLET | Freq: Two times a day (BID) | ORAL | 0 refills | Status: DC
Start: 2023-11-14 — End: 2023-12-31
  Filled 2023-11-14 (×2): qty 14, 7d supply, fill #0

## 2023-11-14 MED ORDER — METHOCARBAMOL 500 MG PO TABS
500.0000 mg | ORAL_TABLET | Freq: Every day | ORAL | 0 refills | Status: DC | PRN
  Filled 2023-11-14: qty 30, 30d supply, fill #0

## 2023-11-14 MED ORDER — CIPROFLOXACIN HCL 500 MG PO TABS
500.0000 mg | ORAL_TABLET | Freq: Two times a day (BID) | ORAL | 0 refills | Status: DC
  Filled 2023-11-14: qty 14, 7d supply, fill #0

## 2023-11-14 NOTE — Progress Notes (Signed)
 Patient ID: Christina Cannon, female    DOB: October 12, 1965  MRN: [id number]  CC: Flank Pain (L flank pain - unable to sleep due to pain X3 weeks /Bilateral leg cramps X1 mo/Reports fall on 4/19)   Subjective: Christina Cannon is a 58 y.o. female who presents for chronic ds management. Her concerns today include:  Pt with hx of tob dep, diverticulosis, obese, OA knees/ankles, diverticulitis, hepatic steatosis, PreDM, vit D def   Discussed the use of AI scribe software for clinical note transcription with the patient, who gave verbal consent to proceed.  History of Present Illness Christina Cannon, a patient with a history of diverticulitis, presents with a constant dull pain in the left side radiating around to the mid abdomen on this side for the past couple of weeks.  She sought emergency care for this pain, which she initially associated with her diverticulitis. At the ER, she was diagnosed with inflammation near her colon and a diverticulitis flare-up, for which she was prescribed antibiotics. She completed the course of antibiotics, but the pain persisted, only easing with the use of hydrocodone . No fever or dysuria.  Endorses one episode of vomiting 4 days ago. Referred to gastroenterology on last visit with me in November.  Patient no showed that appointment in January stating that she was out of town for a funeral.  She did not call to cancel or reschedule.  In addition to the flank pain, the patient has been experiencing diarrhea for about a month. The stools are loose and watery, and she reports urgency to the point of incontinence. She also noticed blood in her stools once or twice in the past few weeks. She has not associated the diarrhea with any particular foods and has not been taking any medication for it. No fever.   I reviewed note from where she was seen in the emergency room on 10/31/2023.  She actually presented with multiple complaints including pain on the left side of  her body ranging from head chest and abdomen with ongoing diarrhea, mild shortness of breath and nausea.  She also reported waking up that morning with blurred vision in the left eye dizziness and weakness on her left side.  She reported stopping her blood pressure medication 4 days prior because her blood pressure was low.  In the ER, vitals were stable with blood pressure of 111/69.  She was afebrile. CBC showed mild elevation in WBC, UA was negative for UTI.    CT of the head was negative.  Chest x-ray revealed no acute disease.  CT of abdomen findings are as stated below: IMPRESSION: 1. Subtle thickening of the lateral conal fascia along the sigmoid mesocolon without associated fat induration. This could represent chronic changes from prior diverticulitis. Alternatively, this could reflect changes of early acute diverticulitis. No pneumoperitoneum or peridiverticular abscess. 2. Small amount of fluid in the distal esophagus possibly due to incomplete emptying or gastroesophageal reflux. 3. Right uterine body fibroid measuring 2.7 cm. 4. Hepatic steatosis.  Leg cramps: The patient also reports leg cramps that have been occurring for a few weeks. The cramps occur mainly when she is sitting or lying down and improve with walking.  Cramping legs were so bad that she fell several days ago when getting up to try to walk the cramps out.  So get cramps in her sides. Not on Statin.  Vit D deficiency: She has been taking dose vitamin D  supplement once a week.   HTN: Should be  on Norvasc  5 mg daily.  He stopped taking it a few weeks ago.  Reports she was told from the ER that blood pressure was low want to hold off on taking it.  Postmenopausal bleeding: Furthermore, the patient experienced a week-long episode of vaginal bleeding after being postmenopausal for four years. The bleeding started as spotting and then became a full menstrual flow, requiring the use of a pad.    Patient Active Problem List    Diagnosis Date Noted   Vitamin D  deficiency, unspecified 05/28/2023   Prediabetes 05/28/2023   Polyp of colon 07/19/2022   Prolonged QT interval 12/21/2021   Colitis 12/21/2021   Diverticulitis 12/20/2021   Hypokalemia 12/20/2021   Obesity (BMI 30-39.9) 12/20/2021   Tobacco use 12/20/2021   History of colonic diverticulitis 07/09/2017   Primary osteoarthritis of both knees 07/09/2017   Primary osteoarthritis of both ankles 07/09/2017   Tobacco dependence 07/09/2017   Elevated blood pressure reading 07/09/2017   Leukocytosis 06/10/2017   Skin tag of vulva 08/10/2015     Current Outpatient Medications on File Prior to Visit  Medication Sig Dispense Refill   amLODipine  (NORVASC ) 5 MG tablet Take 1 tablet (5 mg total) by mouth daily. (Patient not taking: Reported on 11/14/2023) 90 tablet 1   diclofenac  Sodium (VOLTAREN ) 1 % GEL Apply 2 g topically 4 (four) times daily. (Patient not taking: Reported on 05/27/2023) 100 g 1   Vitamin D , Ergocalciferol , (DRISDOL ) 1.25 MG (50000 UNIT) CAPS capsule Take 1 capsule (50,000 Units total) by mouth every 7 (seven) days. 12 capsule 1   No current facility-administered medications on file prior to visit.    Allergies  Allergen Reactions   Ceftin [Cefuroxime Axetil] Anaphylaxis and Swelling    Throat and face swell. Pt stated she had a regimen in the hospital recently(2018) and did just fine.    Social History   Socioeconomic History   Marital status: Widowed    Spouse name: Not on file   Number of children: Not on file   Years of education: Not on file   Highest education level: Some college, no degree  Occupational History   Not on file  Tobacco Use   Smoking status: Former   Smokeless tobacco: Never   Tobacco comments:    Former occasional smoker started around age 43,   Vaping Use   Vaping status: Never Used  Substance and Sexual Activity   Alcohol use: Never   Drug use: Not Currently    Types: Marijuana    Comment: Prior  use   Sexual activity: Yes    Birth control/protection: None, Post-menopausal  Other Topics Concern   Not on file  Social History Narrative   Not on file   Social Drivers of Health   Financial Resource Strain: Low Risk  (11/14/2023)   Overall Financial Resource Strain (CARDIA)    Difficulty of Paying Living Expenses: Not hard at all  Food Insecurity: No Food Insecurity (11/14/2023)   Hunger Vital Sign    Worried About Running Out of Food in the Last Year: Never true    Ran Out of Food in the Last Year: Never true  Transportation Needs: No Transportation Needs (11/14/2023)   PRAPARE - Administrator, Civil Service (Medical): No    Lack of Transportation (Non-Medical): No  Recent Concern: Transportation Needs - Unmet Transportation Needs (11/12/2023)   PRAPARE - Administrator, Civil Service (Medical): Yes    Lack of Transportation (Non-Medical): Yes  Physical Activity: Insufficiently Active (11/14/2023)   Exercise Vital Sign    Days of Exercise per Week: 4 days    Minutes of Exercise per Session: 30 min  Stress: No Stress Concern Present (11/14/2023)   [employer] of Occupational Health - Occupational Stress Questionnaire    Feeling of Stress : Only a little  Recent Concern: Stress - Stress Concern Present (11/12/2023)   [employer] of Occupational Health - Occupational Stress Questionnaire    Feeling of Stress : To some extent  Social Connections: Moderately Integrated (11/14/2023)   Social Connection and Isolation Panel [NHANES]    Frequency of Communication with Friends and Family: More than three times a week    Frequency of Social Gatherings with Friends and Family: More than three times a week    Attends Religious Services: More than 4 times per year    Active Member of [employer] or Organizations: Yes    Attends Banker Meetings: 1 to 4 times per year    Marital Status: Widowed  Recent Concern: Social Connections - Moderately Isolated  (11/12/2023)   Social Connection and Isolation Panel [NHANES]    Frequency of Communication with Friends and Family: More than three times a week    Frequency of Social Gatherings with Friends and Family: Twice a week    Attends Religious Services: More than 4 times per year    Active Member of [employer] or Organizations: No    Attends Banker Meetings: Not on file    Marital Status: Widowed  Intimate Partner Violence: Not At Risk (11/14/2023)   Humiliation, Afraid, Rape, and Kick questionnaire    Fear of Current or Ex-Partner: No    Emotionally Abused: No    Physically Abused: No    Sexually Abused: No    Family History  Problem Relation Age of Onset   Diabetes Mother    Hypertension Mother    Diabetes Father    Hypertension Father    Heart failure Father    Colon cancer Neg Hx    Colon polyps Neg Hx    Esophageal cancer Neg Hx    Rectal cancer Neg Hx    Stomach cancer Neg Hx     Past Surgical History:  Procedure Laterality Date   BIOPSY  07/19/2022   Procedure: BIOPSY;  Surgeon: Elois Hair, MD;  Location: [facility];  Service: Gastroenterology;;   CHOLECYSTECTOMY  1988   COLONOSCOPY WITH PROPOFOL  N/A 07/19/2022   Procedure: COLONOSCOPY WITH PROPOFOL ;  Surgeon: Elois Hair, MD;  Location: [facility];  Service: Gastroenterology;  Laterality: N/A;   HAND SURGERY  1997   POLYPECTOMY N/A 07/19/2022   Procedure: POLYPECTOMY;  Surgeon: Elois Hair, MD;  Location: [facility];  Service: Gastroenterology;  Laterality: N/A;    ROS: Review of Systems Negative except as stated above  PHYSICAL EXAM: BP (!) 150/88 (BP Location: Left Arm, Patient Position: Sitting, Cuff Size: Normal)   Pulse 62   Temp 97.9 F (36.6 C) (Oral)   Ht 5\' 7"  (1.702 m)   Wt 236 lb (107 kg)   LMP 05/29/2019   SpO2 98%   BMI 36.96 kg/m   Wt Readings from Last 3 Encounters:  11/14/23 236 lb (107 kg)  10/31/23 218 lb (98.9 kg)  05/27/23 239 lb 9.6 oz (108.7 kg)   ' Physical Exam  General appearance - alert, well appearing, and in no distress Mental status - normal mood, behavior, speech, dress, motor activity, and thought processes  Neck - supple, no significant adenopathy Chest - clear to auscultation, no wheezes, rales or rhonchi, symmetric air entry Heart - normal rate, regular rhythm, normal S1, S2, no murmurs, rubs, clicks or gallops Abdomen: Normal bowel sounds, nondistended, soft, mild left mid quadrant tenderness with slight guarding but no rebound. Extremities - peripheral pulses normal, no pedal edema, no clubbing or cyanosis MSK: No flank tenderness     Latest Ref Rng & Units 10/31/2023    8:36 AM 05/27/2023   10:26 AM 04/30/2023   12:55 PM  CMP  Glucose 70 - 99 mg/dL 161  096  045   BUN 6 - 20 mg/dL 10  14  11    Creatinine 0.44 - 1.00 mg/dL 4.09  8.11  9.14   Sodium 135 - 145 mmol/L 137  140  137   Potassium 3.5 - 5.1 mmol/L 3.5  4.2  3.4   Chloride 98 - 111 mmol/L 103  104  105   CO2 22 - 32 mmol/L 22  23  20    Calcium 8.9 - 10.3 mg/dL 8.9  9.5  8.8   Total Protein 6.5 - 8.1 g/dL 6.9   7.1   Total Bilirubin 0.0 - 1.2 mg/dL 0.7   1.0   Alkaline Phos 38 - 126 U/L 47   64   AST 15 - 41 U/L 19   19   ALT 0 - 44 U/L 15   18    Lipid Panel  No results found for: "CHOL", "TRIG", "HDL", "CHOLHDL", "VLDL", "LDLCALC", "LDLDIRECT"  CBC    Component Value Date/Time   WBC 11.0 (H) 10/31/2023 0836   RBC 4.97 10/31/2023 0836   HGB 14.4 10/31/2023 0836   HGB 14.7 05/27/2023 1026   HCT 42.3 10/31/2023 0836   HCT 45.3 05/27/2023 1026   PLT 234 10/31/2023 0836   PLT 215 05/27/2023 1026   MCV 85.1 10/31/2023 0836   MCV 87 05/27/2023 1026   MCH 29.0 10/31/2023 0836   MCHC 34.0 10/31/2023 0836   RDW 13.4 10/31/2023 0836   RDW 12.1 05/27/2023 1026   LYMPHSABS 2.8 05/27/2023 1026   MONOABS 0.7 12/24/2021 0334   EOSABS 0.1 05/27/2023 1026   BASOSABS 0.0 05/27/2023 1026    ASSESSMENT AND PLAN: 1. Abdominal pain, left lateral  (Primary) Patient with ongoing left-sided abdominal pain times a few weeks diarrhea x 1 month with 2 bloody bowel movements in the past few weeks.  [place] diagnosis include possible ongoing diverticulosis vs IBD -stool cx including C.diff Give another round of Cipro .Flagyl  Push fluid. BRAT diet for several days - Ambulatory referral to Gastroenterology - Urinalysis, Routine w reflex microscopic  2. Diarrhea, unspecified type See #1 - Cdiff NAA+O+P+Stool Culture - Ambulatory referral to Gastroenterology - Basic Metabolic Panel  3. Post-menopausal bleeding - US  Pelvic Complete With Transvaginal; Future - Ambulatory referral to Gynecology  4. Muscle spasm - Basic Metabolic Panel Trail Robaxin . Advised med can cause drowsiness - methocarbamol  (ROBAXIN ) 500 MG tablet; Take 1 tablet (500 mg total) by mouth daily as needed for muscle spasms.  Dispense: 30 tablet; Refill: 0  5. History of recent fall Related to muscle spasms  6. Essential hypertension Repeat BP today still elev. Advise to restart Norvasc  5 mg  7. Diverticulitis - Ambulatory referral to Gastroenterology - CBC with Differential/Platelet - ciprofloxacin  (CIPRO ) 500 MG tablet; Take 1 tablet (500 mg total) by mouth every 12 (twelve) hours.  Dispense: 14 tablet; Refill: 0 - metroNIDAZOLE  (FLAGYL ) 500 MG tablet; Take 1  tablet (500 mg total) by mouth 2 (two) times daily.  Dispense: 14 tablet; Refill: 0  8. Vitamin D  deficiency, unspecified - VITAMIN D  25 Hydroxy (Vit-D Deficiency, Fractures)    Patient was given the opportunity to ask questions.  Patient verbalized understanding of the plan and was able to repeat key elements of the plan.   This documentation was completed using Paediatric nurse.  Any transcriptional errors are unintentional.  Orders Placed This Encounter  Procedures   Cdiff NAA+O+P+Stool Culture   US  Pelvic Complete With Transvaginal   Basic Metabolic Panel   CBC with  Differential/Platelet   Urinalysis, Routine w reflex microscopic   VITAMIN D  25 Hydroxy (Vit-D Deficiency, Fractures)   Ambulatory referral to Gynecology   Ambulatory referral to Gastroenterology     Requested Prescriptions   Signed Prescriptions Disp Refills   methocarbamol  (ROBAXIN ) 500 MG tablet 30 tablet 0    Sig: Take 1 tablet (500 mg total) by mouth daily as needed for muscle spasms.   ciprofloxacin  (CIPRO ) 500 MG tablet 14 tablet 0    Sig: Take 1 tablet (500 mg total) by mouth every 12 (twelve) hours.   metroNIDAZOLE  (FLAGYL ) 500 MG tablet 14 tablet 0    Sig: Take 1 tablet (500 mg total) by mouth 2 (two) times daily.    Return in about 2 months (around 01/14/2024).  Concetta Dee, MD, FACP

## 2023-11-14 NOTE — Patient Instructions (Signed)
 VISIT SUMMARY:  During your visit, we discussed your ongoing left flank pain, diarrhea, leg cramps, and recent postmenopausal bleeding. We reviewed your recent emergency room visit and the treatments you received there. We have made a plan to address each of these issues and will be conducting further tests to get a clearer understanding of your condition.  YOUR PLAN:  -DIVERTICULITIS: Diverticulitis is an inflammation or infection of small pouches that can form in your intestines. You have had a recent flare-up, and despite completing antibiotics, your pain persists. We will refer you to a gastroenterologist for further evaluation, prescribe ciprofloxacin  and metronidazole , and order a stool sample and blood tests. Please increase your fluid intake and follow the BRAT diet. You can also take over-the-counter loperamide to help with diarrhea.  -DIARRHEA: You have been experiencing chronic diarrhea, which may be related to your recent antibiotic use. We will order a stool sample for testing and recommend the BRAT diet. Over-the-counter Imodium can help manage your symptoms.  -HYPERTENSION: Hypertension is high blood pressure. Since your blood pressure has been elevated after stopping amlodipine , we recommend restarting this medication.  -POSTMENOPAUSAL BLEEDING: Postmenopausal bleeding is any vaginal bleeding that occurs after you have stopped having periods for at least a year. This needs further evaluation to rule out any serious conditions. We will refer you to a gynecologist and order a pelvic ultrasound.  -LEG CRAMPS: Leg cramps are sudden, involuntary muscle contractions. Your cramps may be due to an electrolyte imbalance. We will order an electrolyte panel and prescribe methocarbamol  500 mg as needed.  INSTRUCTIONS:  Please follow up with the gastroenterologist and gynecologist as soon as possible. Complete the stool sample and blood tests as ordered. Restart your amlodipine  for blood  pressure management. If you have any new or worsening symptoms, contact our office immediately.

## 2023-11-15 ENCOUNTER — Encounter: Payer: Self-pay | Admitting: Internal Medicine

## 2023-11-15 DIAGNOSIS — R197 Diarrhea, unspecified: Secondary | ICD-10-CM | POA: Diagnosis not present

## 2023-11-15 LAB — MICROSCOPIC EXAMINATION
Bacteria, UA: NONE SEEN
Casts: NONE SEEN /LPF
RBC, Urine: >30 /HPF — AB (ref 0–2)
WBC, UA: NONE SEEN /HPF (ref 0–5)

## 2023-11-15 LAB — CBC WITH DIFFERENTIAL/PLATELET
Basophils Absolute: 0 10*3/uL (ref 0.0–0.2)
Basos: 0 %
EOS (ABSOLUTE): 0.1 10*3/uL (ref 0.0–0.4)
Eos: 1 %
Hematocrit: 45.6 % (ref 34.0–46.6)
Hemoglobin: 14.9 g/dL (ref 11.1–15.9)
Immature Grans (Abs): 0 10*3/uL (ref 0.0–0.1)
Immature Granulocytes: 0 %
Lymphocytes Absolute: 3 10*3/uL (ref 0.7–3.1)
Lymphs: 32 %
MCH: 28.5 pg (ref 26.6–33.0)
MCHC: 32.7 g/dL (ref 31.5–35.7)
MCV: 87 fL (ref 79–97)
Monocytes Absolute: 0.6 10*3/uL (ref 0.1–0.9)
Monocytes: 7 %
Neutrophils Absolute: 5.6 10*3/uL (ref 1.4–7.0)
Neutrophils: 60 %
Platelets: 237 10*3/uL (ref 150–450)
RBC: 5.22 x10E6/uL (ref 3.77–5.28)
RDW: 13.4 % (ref 11.7–15.4)
WBC: 9.4 10*3/uL (ref 3.4–10.8)

## 2023-11-15 LAB — BASIC METABOLIC PANEL WITH GFR
BUN/Creatinine Ratio: 12 (ref 9–23)
BUN: 12 mg/dL (ref 6–24)
CO2: 22 mmol/L (ref 20–29)
Calcium: 9.4 mg/dL (ref 8.7–10.2)
Chloride: 104 mmol/L (ref 96–106)
Creatinine, Ser: 0.98 mg/dL (ref 0.57–1.00)
Glucose: 83 mg/dL (ref 70–99)
Potassium: 4.1 mmol/L (ref 3.5–5.2)
Sodium: 141 mmol/L (ref 134–144)
eGFR: 66 mL/min/1.73 (ref >59)

## 2023-11-15 LAB — URINALYSIS, ROUTINE W REFLEX MICROSCOPIC
Bilirubin, UA: NEGATIVE
Glucose, UA: NEGATIVE
Ketones, UA: NEGATIVE
Leukocytes,UA: NEGATIVE
Nitrite, UA: NEGATIVE
Protein,UA: NEGATIVE
Specific Gravity, UA: 1.014 (ref 1.005–1.030)
Urobilinogen, Ur: 0.2 mg/dL (ref 0.2–1.0)
pH, UA: 5.5 (ref 5.0–7.5)

## 2023-11-15 LAB — VITAMIN D 25 HYDROXY (VIT D DEFICIENCY, FRACTURES): Vit D, 25-Hydroxy: 21.8 ng/mL — ABNORMAL LOW (ref 30.0–100.0)

## 2023-11-15 NOTE — Addendum Note (Signed)
 Addended by: Concetta Dee B on: 11/15/2023 04:38 PM   Modules accepted: Orders

## 2023-11-19 ENCOUNTER — Ambulatory Visit
Admission: RE | Admit: 2023-11-19 | Discharge: 2023-11-19 | Disposition: A | Discharge status: Home / Self Care | Source: Ambulatory Visit | Attending: Internal Medicine | Admitting: Internal Medicine

## 2023-11-19 ENCOUNTER — Other Ambulatory Visit: Payer: Self-pay | Admitting: Internal Medicine

## 2023-11-19 DIAGNOSIS — R197 Diarrhea, unspecified: Secondary | ICD-10-CM

## 2023-11-19 DIAGNOSIS — N950 Postmenopausal bleeding: Secondary | ICD-10-CM | POA: Diagnosis not present

## 2023-11-19 DIAGNOSIS — D252 Subserosal leiomyoma of uterus: Secondary | ICD-10-CM | POA: Diagnosis not present

## 2023-11-19 DIAGNOSIS — D251 Intramural leiomyoma of uterus: Secondary | ICD-10-CM | POA: Diagnosis not present

## 2023-11-20 ENCOUNTER — Encounter: Payer: Self-pay | Admitting: Internal Medicine

## 2023-11-20 LAB — STOOL CULTURE
E coli, Shiga toxin Assay: NEGATIVE
E coli, Shiga toxin Assay: NEGATIVE *Deleted

## 2023-11-21 ENCOUNTER — Encounter: Payer: Self-pay | Admitting: Internal Medicine

## 2023-11-21 LAB — CLOSTRIDIUM DIFFICILE BY PCR: Toxigenic C. Difficile by PCR: NEGATIVE

## 2023-11-29 ENCOUNTER — Encounter: Payer: Self-pay | Admitting: Radiology

## 2023-11-29 ENCOUNTER — Other Ambulatory Visit (HOSPITAL_COMMUNITY): Payer: Self-pay

## 2023-11-29 ENCOUNTER — Ambulatory Visit (INDEPENDENT_AMBULATORY_CARE_PROVIDER_SITE_OTHER): Payer: Self-pay | Admitting: Radiology

## 2023-11-29 VITALS — BP 118/76 | HR 86 | Ht 68.25 in | Wt 233.8 lb

## 2023-11-29 DIAGNOSIS — N950 Postmenopausal bleeding: Secondary | ICD-10-CM

## 2023-11-29 MED ORDER — MISOPROSTOL 200 MCG PO TABS
400.0000 ug | ORAL_TABLET | Freq: Once | ORAL | 0 refills | Status: DC
  Filled 2023-11-29: qty 2, 1d supply, fill #0

## 2023-11-29 NOTE — Progress Notes (Addendum)
   Christina Cannon 1965/08/04 [id number]   History: Postmenopausal 58 y.o. Referred from PCP for PMB Complains of PM bleeding 10/30/23 x 7 days, normal menstrual flow. Began dark brown x 2 days then red blood. No pain. Former smoker. Recently sexually active again after 3 years. Had u/s done at [facility] 11/19/23 results not available yet. 2.7cm Fibroid seen on CT scan 10/2023.  Gynecologic History Postmenopausal Last Pap: 02/2022. Results were: normal Last mammogram: 01/2022. Results were: normal Last colonoscopy: 06/2022   Obstetric History OB History  Gravida Para Term Preterm AB Living  4    4 0  SAB IAB Ectopic Multiple Live Births  3 1   0    # Outcome Date GA Lbr Len/2nd Weight Sex Type Anes PTL Lv  4 SAB           3 SAB           2 SAB           1 IAB            Timeout performed.    The following portions of the patient's history were reviewed and updated as appropriate: allergies, current medications, past family history, past medical history, past social history, past surgical history, and problem list.  Review of Systems Pertinent items noted in HPI and remainder of comprehensive ROS otherwise negative.  Past medical history, past surgical history, family history and social history were all reviewed and documented in the EPIC chart.  Exam:  Vitals:   11/29/23 0915  BP: 118/76  Pulse: 86  SpO2: 96%  Weight: 233 lb 12.8 oz (106.1 kg)  Height: 5' 8.25" (1.734 m)   Body mass index is 35.29 kg/m.  Time out performed. Reviewed risks and procedure for EMB.  General appearance:  Normal Genitourinary   Inguinal/mons:  Normal without inguinal adenopathy  External genitalia:  Normal appearing vulva with no masses, tenderness, or lesions  BUS/Urethra/Skene's glands:  Normal  Vagina:  Normal appearing with normal color and discharge, no lesions. Atrophy: mild   Cervix:  Normal appearing without discharge or lesions. Stenotic. Cleansed x 3 with betadine. Attempted to  sound with Os finder and dilators with no results.   Uterus:  Normal in size, shape and contour.  Midline and mobile, nontender  Adnexa/parametria:     Rt: Normal in size, without masses or tenderness.   Lt: Normal in size, without masses or tenderness.  Anus and perineum: Normal    Christina Cannon, CMA present for exam  Assessment/Plan:   1. Post-menopausal bleeding (Primary) EMB unsuccessful, will return for EMB after using cytotec. Iburpofen 800mg  2 hours before procedure. - Endometrial biopsy; Future - misoprostol (CYTOTEC) 200 MCG tablet; Place 2 tablets (400 mcg total) vaginally once for 1 dose. The night before procedure  Dispense: 2 tablet; Refill: 0   Called [facility] to expedite ultrasound report  Christina Cannon WHNP-BC, 9:47 AM 11/29/2023

## 2023-11-30 ENCOUNTER — Encounter: Payer: Self-pay | Admitting: Internal Medicine

## 2023-12-10 ENCOUNTER — Other Ambulatory Visit (HOSPITAL_COMMUNITY): Payer: Self-pay

## 2023-12-13 ENCOUNTER — Ambulatory Visit: Admitting: Internal Medicine

## 2023-12-31 ENCOUNTER — Ambulatory Visit: Admitting: Gastroenterology

## 2023-12-31 ENCOUNTER — Encounter: Payer: Self-pay | Admitting: Gastroenterology

## 2023-12-31 ENCOUNTER — Other Ambulatory Visit (HOSPITAL_COMMUNITY): Payer: Self-pay

## 2023-12-31 VITALS — BP 134/82 | HR 83 | Ht 67.0 in | Wt 236.5 lb

## 2023-12-31 DIAGNOSIS — R1030 Lower abdominal pain, unspecified: Secondary | ICD-10-CM | POA: Diagnosis not present

## 2023-12-31 DIAGNOSIS — R194 Change in bowel habit: Secondary | ICD-10-CM

## 2023-12-31 DIAGNOSIS — Z860100 Personal history of colon polyps, unspecified: Secondary | ICD-10-CM | POA: Diagnosis not present

## 2023-12-31 DIAGNOSIS — Z8719 Personal history of other diseases of the digestive system: Secondary | ICD-10-CM

## 2023-12-31 DIAGNOSIS — K921 Melena: Secondary | ICD-10-CM

## 2023-12-31 MED ORDER — NA SULFATE-K SULFATE-MG SULF 17.5-3.13-1.6 GM/177ML PO SOLN
1.0000 | Freq: Once | ORAL | 0 refills | Status: AC
  Filled 2023-12-31: qty 354, 1d supply, fill #0

## 2023-12-31 NOTE — Progress Notes (Signed)
 Chief Complaint: abdominal pain Primary GI MD: Dr. Cherryl Corona  HPI: Discussed the use of AI scribe software for clinical note transcription with the patient, who gave verbal consent to proceed.  History of Present Illness Christina Cannon is a 58 year old female with hyperplastic polyps and possible precancerous polyps who presents with ongoing abdominal pain and altered bowel habits.  She has experienced persistent lower abdominal pain since 2024. She takes four ibuprofen daily, which offers slight improvement in her symptoms.  Her bowel movements have become increasingly frequent, characterized by diarrhea and episodes of incontinence. She recently experienced watery diarrhea at work, preceded by abdominal discomfort. Her bowel habits have been erratic since 2024, alternating between diarrhea and occasional solid stools. She reports a recent episode of black stools lasting three days and denies taking Pepto-Bismol or iron supplements. Blood was noted in her stool two days prior to the visit.  Her past medical history includes a colonoscopy in 2023 that revealed large hyperplastic polyps and a previous colonoscopy showing possible precancerous polyps. A CT scan in 2024 and another in April 2025 showed thickening of the sigmoid colon. She has not had an endoscopy in recent years.  She reports mild heartburn but denies any upper abdominal pain. She is not on any blood thinners.   PREVIOUS GI WORKUP   CTAP with contrast 10/2023 for abdominal pain IMPRESSION: 1. Subtle thickening of the lateral conal fascia along the sigmoid mesocolon without associated fat induration. This could represent chronic changes from prior diverticulitis. Alternatively, this could reflect changes of early acute diverticulitis. No pneumoperitoneum or peridiverticular abscess. 2. Small amount of fluid in the distal esophagus possibly due to incomplete emptying or gastroesophageal reflux. 3. Right uterine  body fibroid measuring 2.7 cm. 4. Hepatic steatosis.  CT abdomen pelvis with contrast 04/2023 for abdominal pain IMPRESSION: 1. Chronic thickening of the sigmoid colon likely on the basis of chronic diverticular disease. However, the thickening appears somewhat more prominent without overt surrounding acute inflammation and there are some more prominent pericolonic lymph nodes in the adjacent mesentery measuring up to 7 mm. Subtle acute diverticulitis would be difficult to exclude. Eventual correlation with colonoscopy recommended to exclude underlying neoplasm. 2. Hepatic steatosis. 3. Aortic atherosclerosis.  Colonoscopy 06/2022 - Lipomatous ileocecal valve. Biopsied.  - One 6 mm polyp in the descending colon, removed with a hot snare. Resected and retrieved.  - Benign polypoid lesion in the sigmoid colon. Complete removal was accomplished.  - Benign polypoid lesion in the sigmoid colon. Complete removal was accomplished.  - Erythematous and inflamed mucosa in the sigmoid colon and in the descending colon. Biopsied.  - Moderate diverticulosis in the sigmoid colon, in the descending colon, in the transverse colon and in the ascending colon. There was narrowing of the colon in association with the diverticular opening. There was evidence of diverticular spasm. Erythema was seen in association with the diverticular opening. Peri- diverticular erythema was seen. There was evidence of an impacted diverticulum. - The examined portion of the ileum was normal.  - The distal rectum and anal verge are normal on retroflexion view.  - The large pedunculated polyp in the descending colon that was seen in October was not seen on today' s exam despite numerous and thorough examinations of the colon. The leftcolon was characterized by extensive diverticulosis with narrowing of the colon and mucosal prolapse with scattered erythematous mucosal changes. The erythematous polyps seemed more consistent with  mucosal prolapse/ inflammatory polyps, but a previous biopsy of  the larger polyp came back as adenoma. I suspect that the large descending colon polyp was also inflammatory in nature and resolved spontaneously. - repeat 1 year (06/2023)  FINAL MICROSCOPIC DIAGNOSIS:   A. ILEOCECAL VALVE, BIOPSY:  Ileocolonic junctional type mucosa with no diagnostic abnormality   B. DESCENDING COLON, POLYPECTOMY:  Hyperplastic polyp with changes of mucosal prolapse  Negative for dysplasia and carcinoma   C. DESCENDING COLON, MUCOSA, BIOPSY:  Benign colonic mucosa with no diagnostic abnormality   D. SIGMOID COLON, POLYPECTOMY:  Hyperplastic polyp  Negative for dysplasia and carcinoma   E. SIGMOID COLON, POLYPECTOMY #2:  Hyperplastic polyp  Negative for dysplasia and carcinoma   Past Medical History:  Diagnosis Date   Diverticulitis    Hypertension     Past Surgical History:  Procedure Laterality Date   BIOPSY  07/19/2022   Procedure: BIOPSY;  Surgeon: Elois Hair, MD;  Location: [facility];  Service: Gastroenterology;;   CHOLECYSTECTOMY  1988   COLONOSCOPY WITH PROPOFOL  N/A 07/19/2022   Procedure: COLONOSCOPY WITH PROPOFOL ;  Surgeon: Elois Hair, MD;  Location: [facility];  Service: Gastroenterology;  Laterality: N/A;   HAND SURGERY  1997   POLYPECTOMY N/A 07/19/2022   Procedure: POLYPECTOMY;  Surgeon: Elois Hair, MD;  Location: [facility];  Service: Gastroenterology;  Laterality: N/A;    Current Outpatient Medications  Medication Sig Dispense Refill   amLODipine  (NORVASC ) 5 MG tablet Take 1 tablet (5 mg total) by mouth daily. 90 tablet 1   methocarbamol  (ROBAXIN ) 500 MG tablet Take 1 tablet (500 mg total) by mouth daily as needed for muscle spasms. 30 tablet 0   Na Sulfate-K Sulfate-Mg Sulfate concentrate (SUPREP) 17.5-3.13-1.6 GM/177ML SOLN Take 1 kit (354 mLs total) by mouth once for 1 dose. 354 mL 0   Vitamin D , Ergocalciferol , (DRISDOL ) 1.25 MG (50000  UNIT) CAPS capsule Take 1 capsule (50,000 Units total) by mouth every 7 (seven) days. 12 capsule 1   misoprostol  (CYTOTEC ) 200 MCG tablet Place 2 tablets (400 mcg total) vaginally once for 1 dose the night before procedure 2 tablet 0   No current facility-administered medications for this visit.    Allergies as of 12/31/2023 - Review Complete 12/31/2023  Allergen Reaction Noted   Ceftin [cefuroxime axetil] Anaphylaxis and Swelling 07/24/2012    Family History  Problem Relation Age of Onset   Diabetes Mother    Hypertension Mother    Diabetes Father    Hypertension Father    Heart failure Father    Multiple sclerosis Sister    Colon cancer Neg Hx    Colon polyps Neg Hx    Esophageal cancer Neg Hx    Rectal cancer Neg Hx    Stomach cancer Neg Hx     Social History   Socioeconomic History   Marital status: Widowed    Spouse name: Not on file   Number of children: Not on file   Years of education: Not on file   Highest education level: Some college, no degree  Occupational History   Not on file  Tobacco Use   Smoking status: Former    Types: Cigarettes    Passive exposure: Current   Smokeless tobacco: Never   Tobacco comments:    Former occasional smoker started around age 11,   Vaping Use   Vaping status: Never Used  Substance and Sexual Activity   Alcohol use: Yes    Comment: occasionally   Drug use: Yes    Types: Marijuana  Sexual activity: Yes    Partners: Male    Birth control/protection: None, Post-menopausal    Comment: menarche 58yo, sexual debut 58yo  Other Topics Concern   Not on file  Social History Narrative   Not on file   Social Drivers of Health   Financial Resource Strain: Low Risk  (11/14/2023)   Overall Financial Resource Strain (CARDIA)    Difficulty of Paying Living Expenses: Not hard at all  Food Insecurity: No Food Insecurity (11/14/2023)   Hunger Vital Sign    Worried About Running Out of Food in the Last Year: Never true    Ran Out  of Food in the Last Year: Never true  Transportation Needs: No Transportation Needs (11/14/2023)   PRAPARE - Administrator, Civil Service (Medical): No    Lack of Transportation (Non-Medical): No  Recent Concern: Transportation Needs - Unmet Transportation Needs (11/12/2023)   PRAPARE - Transportation    Lack of Transportation (Medical): Yes    Lack of Transportation (Non-Medical): Yes  Physical Activity: Insufficiently Active (11/14/2023)   Exercise Vital Sign    Days of Exercise per Week: 4 days    Minutes of Exercise per Session: 30 min  Stress: No Stress Concern Present (11/14/2023)   [employer] of Occupational Health - Occupational Stress Questionnaire    Feeling of Stress : Only a little  Recent Concern: Stress - Stress Concern Present (11/12/2023)   [employer] of Occupational Health - Occupational Stress Questionnaire    Feeling of Stress : To some extent  Social Connections: Moderately Integrated (11/14/2023)   Social Connection and Isolation Panel [NHANES]    Frequency of Communication with Friends and Family: More than three times a week    Frequency of Social Gatherings with Friends and Family: More than three times a week    Attends Religious Services: More than 4 times per year    Active Member of [employer] or Organizations: Yes    Attends Banker Meetings: 1 to 4 times per year    Marital Status: Widowed  Recent Concern: Social Connections - Moderately Isolated (11/12/2023)   Social Connection and Isolation Panel [NHANES]    Frequency of Communication with Friends and Family: More than three times a week    Frequency of Social Gatherings with Friends and Family: Twice a week    Attends Religious Services: More than 4 times per year    Active Member of [employer] or Organizations: No    Attends Banker Meetings: Not on file    Marital Status: Widowed  Intimate Partner Violence: Not At Risk (11/14/2023)   Humiliation, Afraid, Rape,  and Kick questionnaire    Fear of Current or Ex-Partner: No    Emotionally Abused: No    Physically Abused: No    Sexually Abused: No    Review of Systems:    Constitutional: No weight loss, fever, chills, weakness or fatigue HEENT: Eyes: No change in vision               Ears, Nose, Throat:  No change in hearing or congestion Skin: No rash or itching Cardiovascular: No chest pain, chest pressure or palpitations   Respiratory: No SOB or cough Gastrointestinal: See HPI and otherwise negative Genitourinary: No dysuria or change in urinary frequency Neurological: No headache, dizziness or syncope Musculoskeletal: No new muscle or joint pain Hematologic: No bleeding or bruising Psychiatric: No history of depression or anxiety    Physical Exam:  Vital signs: BP  134/82   Pulse 83   Ht 5\' 7"  (1.702 m)   Wt 236 lb 8 oz (107.3 kg)   LMP 05/29/2019   SpO2 96%   BMI 37.04 kg/m   Constitutional: NAD, alert and cooperative Head:  Normocephalic and atraumatic. Eyes:   PEERL, EOMI. No icterus. Conjunctiva pink. Respiratory: Respirations even and unlabored. Lungs clear to auscultation bilaterally.   No wheezes, crackles, or rhonchi.  Cardiovascular:  Regular rate and rhythm. No peripheral edema, cyanosis or pallor.  Gastrointestinal:  Soft, nondistended, nontender. No rebound or guarding. Normal bowel sounds. No appreciable masses or hepatomegaly. Rectal:  Declines Msk:  Symmetrical without gross deformities. Without edema, no deformity or joint abnormality.  Neurologic:  Alert and  oriented x4;  grossly normal neurologically.  Skin:   Dry and intact without significant lesions or rashes. Psychiatric: Oriented to person, place and time. Demonstrates good judgement and reason without abnormal affect or behaviors.  RELEVANT LABS AND IMAGING: CBC    Component Value Date/Time   WBC 9.4 11/14/2023 1216   WBC 11.0 (H) 10/31/2023 0836   RBC 5.22 11/14/2023 1216   RBC 4.97 10/31/2023 0836    HGB 14.9 11/14/2023 1216   HCT 45.6 11/14/2023 1216   PLT 237 11/14/2023 1216   MCV 87 11/14/2023 1216   MCH 28.5 11/14/2023 1216   MCH 29.0 10/31/2023 0836   MCHC 32.7 11/14/2023 1216   MCHC 34.0 10/31/2023 0836   RDW 13.4 11/14/2023 1216   LYMPHSABS 3.0 11/14/2023 1216   MONOABS 0.7 12/24/2021 0334   EOSABS 0.1 11/14/2023 1216   BASOSABS 0.0 11/14/2023 1216    CMP     Component Value Date/Time   NA 141 11/14/2023 1216   K 4.1 11/14/2023 1216   CL 104 11/14/2023 1216   CO2 22 11/14/2023 1216   GLUCOSE 83 11/14/2023 1216   GLUCOSE 103 (H) 10/31/2023 0836   BUN 12 11/14/2023 1216   CREATININE 0.98 11/14/2023 1216   CALCIUM 9.4 11/14/2023 1216   PROT 6.9 10/31/2023 0836   ALBUMIN 3.4 (L) 10/31/2023 0836   AST 19 10/31/2023 0836   ALT 15 10/31/2023 0836   ALKPHOS 47 10/31/2023 0836   BILITOT 0.7 10/31/2023 0836   GFRNONAA >60 10/31/2023 0836   GFRAA >60 06/19/2017 2216     Assessment/Plan:   Alternating bowel  habits Lower Abdominal pain. Alternating loose stools and solid stools with lower abdominal pain ongoing since 2024.  Negative C. difficile and stool culture.  Normal CBC/CMP.  CTAP with contrast 10/2023 shows subtle thickening along sigmoid mesocolon possibly early acute diverticulitis.  CTAP 04/2023 with chronic thickening of sigmoid colon consistent with acute diverticulitis, associated prominent pericolonic lymph nodes and adjacent mesentery. Recent 2 CT scans showing chronic thickening of sigmoid and colonoscopy in 2023 showing large polyps in sigmoid with inconsistency between pathology and recall of 1 year.  Patient definitely needs a colonoscopy for further evaluation of abnormal CT scan and present symptoms.  Suspect possible SCAD versus IBS especially with possible constipation history needing 2 day prep - Fiber supplement - IBgard - Colonoscopy for further evaluation  I thoroughly discussed the procedure with the patient (at bedside) to include nature of  the procedure, alternatives, benefits, and risks (including but not limited to bleeding, infection, perforation, anesthesia/cardiac pulmonary complications).  Patient verbalized understanding and gave verbal consent to proceed with procedure.   History of colon polyps Last colonoscopy 06/2022 with large hyperplastic polyps.  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  Given the discordance between pathology of having tubulovillous adenoma October 2023 recommendation was to repeat colonoscopy in 1 year with a 2-day prep - Colonoscopy with 2-day prep  Melena 3 days of melena without Pepto-Bismol or iron use.  Extensive ibuprofen use with 4 pills/day.  No GERD or upper abdominal pain.  No previous EGD.  Possible gastritis versus PUD.  No anemia.  Offered EGD but unable to pursue due to upcoming job change and she would like to pursue colonoscopy only - Avoid ibuprofen - If return of melena please let us  know   [facility] 12/31/2023, 12:46 PM  Cc: Lawrance Presume, MD

## 2023-12-31 NOTE — Patient Instructions (Addendum)
 A high fiber diet with plenty of fluids (up to 8 glasses of water daily) is suggested to relieve these symptoms.  Benefiber, 1 tablespoon once or twice daily can be used to keep bowels regular if needed.   You have been scheduled for a colonoscopy. Please follow written instructions given to you at your visit today.   If you use inhalers (even only as needed), please bring them with you on the day of your procedure.  DO NOT TAKE 7 DAYS PRIOR TO TEST- Trulicity (dulaglutide) Ozempic, Wegovy (semaglutide) Mounjaro (tirzepatide) Bydureon Bcise (exanatide extended release)  DO NOT TAKE 1 DAY PRIOR TO YOUR TEST Rybelsus (semaglutide) Adlyxin (lixisenatide) Victoza (liraglutide) Byetta (exanatide) ___________________________________________________________________________

## 2024-01-03 ENCOUNTER — Other Ambulatory Visit (HOSPITAL_COMMUNITY): Payer: Self-pay

## 2024-01-08 ENCOUNTER — Other Ambulatory Visit (HOSPITAL_COMMUNITY)
Admission: RE | Admit: 2024-01-08 | Discharge: 2024-01-08 | Disposition: A | Discharge status: Home / Self Care | Source: Ambulatory Visit | Attending: Radiology | Admitting: Radiology

## 2024-01-08 ENCOUNTER — Ambulatory Visit: Admitting: Radiology

## 2024-01-08 ENCOUNTER — Encounter: Payer: Self-pay | Admitting: Radiology

## 2024-01-08 DIAGNOSIS — N950 Postmenopausal bleeding: Secondary | ICD-10-CM | POA: Insufficient documentation

## 2024-01-08 DIAGNOSIS — N8500 Endometrial hyperplasia, unspecified: Secondary | ICD-10-CM | POA: Diagnosis not present

## 2024-01-08 NOTE — Progress Notes (Signed)
   Christina Cannon [birth year] [id number]   History: Postmenopausal 58 y.o. Referred from PCP for PMB Complains of PM bleeding 10/30/23 x 7 days, normal menstrual flow. Began dark brown x 2 days then red blood. No pain. Former smoker. Recently sexually active again after 3 years. Had u/s done at [facility] 11/19/23 which showed a 4mm endometrial thickness. Has had no further bleeding.  Gynecologic History Postmenopausal Last Pap: 02/2022. Results were: normal Last mammogram: 01/2022. Results were: normal Last colonoscopy: 06/2022   Obstetric History OB History  Gravida Para Term Preterm AB Living  4    4 0  SAB IAB Ectopic Multiple Live Births  3 1   0    # Outcome Date GA Lbr Len/2nd Weight Sex Type Anes PTL Lv  4 SAB           3 SAB           2 SAB           1 IAB            Timeout performed.    The following portions of the patient's history were reviewed and updated as appropriate: allergies, current medications, past family history, past medical history, past social history, past surgical history, and problem list.  Review of Systems Pertinent items noted in HPI and remainder of comprehensive ROS otherwise negative.  Past medical history, past surgical history, family history and social history were all reviewed and documented in the EPIC chart.  Exam:  Vitals:   01/08/24 1152  BP: 134/80  Pulse: 70  SpO2: 99%  Weight: 236 lb 6.4 oz (107.2 kg)   Body mass index is 37.03 kg/m.  Time out performed. Reviewed risks and procedure for EMB.  General appearance:  Normal Genitourinary   Inguinal/mons:  Normal without inguinal adenopathy  External genitalia:  Normal appearing vulva with no masses, tenderness, or lesions  BUS/Urethra/Skene's glands:  Normal  Vagina:  Normal appearing with normal color and discharge, no lesions. Atrophy: mild   Cervix:  Normal appearing without discharge or lesions. Stenotic. Cleansed x 3 with betadine. Unable to sound, Dr Andrena Ke was called  in to assist. 10cc of lidocaine  1% was injected into the cervix at 3 and 9 oclock. Was then able to dilate the cervix and allow the pipelle to pass. 2 passes were made, an adequate sample was collected.   Uterus:  Normal in size, shape and contour.  Midline and mobile, nontender  Adnexa/parametria:     Rt: Normal in size, without masses or tenderness.   Lt: Normal in size, without masses or tenderness.  Anus and perineum: Normal    Ellis Guys, CMA present for exam  Assessment/Plan:   1. Post-menopausal bleeding - Endometrial biopsy - Surgical pathology( [place]/ [facility])   Macrina Lehnert B WHNP-BC, 12:15 PM 01/08/2024

## 2024-01-10 ENCOUNTER — Encounter: Payer: Self-pay | Admitting: Gastroenterology

## 2024-01-13 LAB — SURGICAL PATHOLOGY

## 2024-01-13 NOTE — Progress Notes (Signed)
 Agree with the assessment and plan as outlined by Nestor Blower, PA-C.  Suspect patient has symptomatic diverticular disease based on persistent sigmoid wall thickening on CT as well as erythematous/inflamed mucosa on colonoscopy with evidence of mucosal prolapse and large inflammatory polyps.  May consider trial of antibiotics vs mesalamine if symptoms not improving with fiber.

## 2024-01-14 ENCOUNTER — Ambulatory Visit: Payer: Self-pay | Admitting: Radiology

## 2024-01-16 ENCOUNTER — Encounter: Payer: Self-pay | Admitting: Internal Medicine

## 2024-01-16 ENCOUNTER — Ambulatory Visit: Attending: Internal Medicine | Admitting: Internal Medicine

## 2024-01-16 ENCOUNTER — Other Ambulatory Visit (HOSPITAL_COMMUNITY): Payer: Self-pay

## 2024-01-16 VITALS — BP 125/85 | HR 75 | Temp 98.2°F | Ht 67.0 in | Wt 232.0 lb

## 2024-01-16 DIAGNOSIS — Z8719 Personal history of other diseases of the digestive system: Secondary | ICD-10-CM

## 2024-01-16 DIAGNOSIS — R7303 Prediabetes: Secondary | ICD-10-CM

## 2024-01-16 DIAGNOSIS — M25511 Pain in right shoulder: Secondary | ICD-10-CM

## 2024-01-16 DIAGNOSIS — I10 Essential (primary) hypertension: Secondary | ICD-10-CM | POA: Diagnosis not present

## 2024-01-16 LAB — POCT GLYCOSYLATED HEMOGLOBIN (HGB A1C): HbA1c, POC (prediabetic range): 5.8 % (ref 5.7–6.4)

## 2024-01-16 LAB — GLUCOSE, POCT (MANUAL RESULT ENTRY): POC Glucose: 139 mg/dL — AB (ref 70–99)

## 2024-01-16 MED ORDER — AMLODIPINE BESYLATE 10 MG PO TABS
10.0000 mg | ORAL_TABLET | Freq: Every day | ORAL | 1 refills | Status: AC
  Filled 2024-01-16: qty 90, 90d supply, fill #0

## 2024-01-16 MED ORDER — IBUPROFEN 800 MG PO TABS
800.0000 mg | ORAL_TABLET | Freq: Two times a day (BID) | ORAL | 0 refills | Status: AC | PRN
  Filled 2024-01-16: qty 40, 20d supply, fill #0

## 2024-01-16 MED ORDER — ACETAMINOPHEN-CODEINE 300-30 MG PO TABS
1.0000 | ORAL_TABLET | Freq: Every evening | ORAL | 0 refills | Status: AC | PRN
  Filled 2024-01-16: qty 30, 15d supply, fill #0

## 2024-01-16 MED ORDER — VITAMIN D (ERGOCALCIFEROL) 1.25 MG (50000 UNIT) PO CAPS
50000.0000 [IU] | ORAL_CAPSULE | ORAL | 1 refills | Status: AC
Start: 2024-01-16 — End: ?
  Filled 2024-01-16: qty 12, 84d supply, fill #0

## 2024-01-16 NOTE — Patient Instructions (Signed)
 VISIT SUMMARY:  During your visit, we discussed your ongoing health concerns, including your right arm pain, hypertension, and upcoming colonoscopy. We also reviewed your postmenopausal bleeding, which has not recurred since your last biopsy.  YOUR PLAN:  -ROTATOR CUFF INJURY: You have persistent right arm pain, likely due to a rotator cuff injury, which affects the muscles or tendons in your shoulder. We will refer you to an orthopedic specialist for further evaluation and possible treatment, including injections and physical therapy. You will switch from Advil to ibuprofen 800 mg for pain relief, and we have prescribed a short course of tramadol  for nighttime pain, which may cause drowsiness.  -HYPERTENSION: Your blood pressure remains slightly elevated despite your current medication. We will increase your amlodipine  dosage to 10 mg daily to better manage your blood pressure. The updated prescription will be sent to [facility] on [address].  -DIVERTICULOSIS: Diverticulosis is a condition where small pouches form in the walls of your colon. You are scheduled for a colonoscopy on June 30th to further investigate this condition and your symptoms of diarrhea.  -POSTMENOPAUSAL BLEEDING: You have not experienced any further postmenopausal bleeding since April, and your uterine biopsy showed no cancerous cells. No further action is needed at this time.  INSTRUCTIONS:  Please proceed with your scheduled colonoscopy on June 30th. We will coordinate your follow-up appointments to accommodate your new work schedule starting July 7th.

## 2024-01-16 NOTE — Progress Notes (Signed)
 Patient ID: Christina Cannon, female    DOB: 1966/01/26  MRN: [id number]  CC: Follow-up (Follow-up & pre-diabetes. Med refills. Heriberto punch nerve of R arm - pain worsens at night & unable to sleep /Requesting ibuprofen 800 mg/Yes to shingles vax. Colonoscopy scheduled for 01/20/2024.)   Subjective: Christina Cannon is a 58 y.o. female who presents for chronic ds management. Her concerns today include:  Pt with hx of tob dep, diverticulosis, obese, OA knees/ankles, diverticulitis, hepatic steatosis, PreDM, vit D def   Discussed the use of AI scribe software for clinical note transcription with the patient, who gave verbal consent to proceed.  History of Present Illness Christina Cannon is a 58 year old female with hypertension and postmenopausal bleeding who presents for a routine follow-up visit.  She has not experienced further postmenopausal bleeding since April, following a uterine biopsy that showed no cancerous cells. Her blood pressure is managed with amlodipine  5 mg daily, and she monitors it at home, reporting stable readings. No CP/SOB.  She has persistent right upper arm and shoulder pain since a fall in April, worst at night, somewhat relieved by pressure. She uses solar pads, Biofreeze, heat pads, and takes four Advil twice daily with little relief.  She took an 800 mg ibuprofen from one of her friends recently and it helped some.  Endorses numbness and tingling but in the RT hand only, especially upon waking if she sleeps on the affected side.  She thinks she has nerve damage to the upper arm and shoulder.  She has seen the gastroenterologist for diarrhea and recurrent diverticulitis.  Diagnosed with having possible IBS.  She is scheduled for a colonoscopy on the 30th.  PreDM: Results for orders placed or performed in visit on 01/16/24  POCT glucose (manual entry)   Collection Time: 01/16/24 10:40 AM  Result Value Ref Range   POC Glucose 139 (A) 70 - 99  mg/dl  POCT glycosylated hemoglobin (Hb A1C)   Collection Time: 01/16/24 10:52 AM  Result Value Ref Range   Hemoglobin A1C     HbA1c POC (<> result, manual entry)     HbA1c, POC (prediabetic range) 5.8 5.7 - 6.4 %   HbA1c, POC (controlled diabetic range)    She feels she is doing okay with her eating habits.  She does a lot of walking and standing at work.     Patient Active Problem List   Diagnosis Date Noted   Vitamin D  deficiency, unspecified 05/28/2023   Prediabetes 05/28/2023   Polyp of colon 07/19/2022   Prolonged QT interval 12/21/2021   Colitis 12/21/2021   Diverticulitis 12/20/2021   Hypokalemia 12/20/2021   Obesity (BMI 30-39.9) 12/20/2021   Tobacco use 12/20/2021   History of colonic diverticulitis 07/09/2017   Primary osteoarthritis of both knees 07/09/2017   Primary osteoarthritis of both ankles 07/09/2017   Tobacco dependence 07/09/2017   Elevated blood pressure reading 07/09/2017   Leukocytosis 06/10/2017   Skin tag of vulva 08/10/2015     Current Outpatient Medications on File Prior to Visit  Medication Sig Dispense Refill   methocarbamol  (ROBAXIN ) 500 MG tablet Take 1 tablet (500 mg total) by mouth daily as needed for muscle spasms. 30 tablet 0   misoprostol  (CYTOTEC ) 200 MCG tablet Place 2 tablets (400 mcg total) vaginally once for 1 dose the night before procedure (Patient not taking: Reported on 01/16/2024) 2 tablet 0   No current facility-administered medications on file prior to visit.    Allergies  Allergen Reactions   Ceftin [Cefuroxime Axetil] Anaphylaxis and Swelling    Throat and face swell. Pt stated she had a regimen in the hospital recently(2018) and did just fine.    Social History   Socioeconomic History   Marital status: Widowed    Spouse name: Not on file   Number of children: Not on file   Years of education: Not on file   Highest education level: Some college, no degree  Occupational History   Not on file  Tobacco Use    Smoking status: Former    Types: Cigarettes    Passive exposure: Current   Smokeless tobacco: Never   Tobacco comments:    Former occasional smoker started around age 74,   Vaping Use   Vaping status: Never Used  Substance and Sexual Activity   Alcohol use: Yes    Comment: occasionally   Drug use: Yes    Types: Marijuana   Sexual activity: Yes    Partners: Male    Birth control/protection: None, Post-menopausal    Comment: menarche 58yo, sexual debut 58yo  Other Topics Concern   Not on file  Social History Narrative   Not on file   Social Drivers of Health   Financial Resource Strain: Low Risk  (11/14/2023)   Overall Financial Resource Strain (CARDIA)    Difficulty of Paying Living Expenses: Not hard at all  Food Insecurity: No Food Insecurity (11/14/2023)   Hunger Vital Sign    Worried About Running Out of Food in the Last Year: Never true    Ran Out of Food in the Last Year: Never true  Transportation Needs: No Transportation Needs (11/14/2023)   PRAPARE - Administrator, Civil Service (Medical): No    Lack of Transportation (Non-Medical): No  Recent Concern: Transportation Needs - Unmet Transportation Needs (11/12/2023)   PRAPARE - Transportation    Lack of Transportation (Medical): Yes    Lack of Transportation (Non-Medical): Yes  Physical Activity: Insufficiently Active (11/14/2023)   Exercise Vital Sign    Days of Exercise per Week: 4 days    Minutes of Exercise per Session: 30 min  Stress: No Stress Concern Present (11/14/2023)   [employer] of Occupational Health - Occupational Stress Questionnaire    Feeling of Stress : Only a little  Recent Concern: Stress - Stress Concern Present (11/12/2023)   [employer] of Occupational Health - Occupational Stress Questionnaire    Feeling of Stress : To some extent  Social Connections: Moderately Integrated (11/14/2023)   Social Connection and Isolation Panel    Frequency of Communication with Friends  and Family: More than three times a week    Frequency of Social Gatherings with Friends and Family: More than three times a week    Attends Religious Services: More than 4 times per year    Active Member of [employer] or Organizations: Yes    Attends Banker Meetings: 1 to 4 times per year    Marital Status: Widowed  Recent Concern: Social Connections - Moderately Isolated (11/12/2023)   Social Connection and Isolation Panel    Frequency of Communication with Friends and Family: More than three times a week    Frequency of Social Gatherings with Friends and Family: Twice a week    Attends Religious Services: More than 4 times per year    Active Member of [employer] or Organizations: No    Attends Banker Meetings: Not on file    Marital Status: Widowed  Intimate Partner Violence: Not At Risk (11/14/2023)   Humiliation, Afraid, Rape, and Kick questionnaire    Fear of Current or Ex-Partner: No    Emotionally Abused: No    Physically Abused: No    Sexually Abused: No    Family History  Problem Relation Age of Onset   Diabetes Mother    Hypertension Mother    Diabetes Father    Hypertension Father    Heart failure Father    Multiple sclerosis Sister    Colon cancer Neg Hx    Colon polyps Neg Hx    Esophageal cancer Neg Hx    Rectal cancer Neg Hx    Stomach cancer Neg Hx     Past Surgical History:  Procedure Laterality Date   BIOPSY  07/19/2022   Procedure: BIOPSY;  Surgeon: Stacia Glendia BRAVO, MD;  Location: [facility];  Service: Gastroenterology;;   CHOLECYSTECTOMY  1988   COLONOSCOPY WITH PROPOFOL  N/A 07/19/2022   Procedure: COLONOSCOPY WITH PROPOFOL ;  Surgeon: Stacia Glendia BRAVO, MD;  Location: [facility];  Service: Gastroenterology;  Laterality: N/A;   HAND SURGERY  1997   POLYPECTOMY N/A 07/19/2022   Procedure: POLYPECTOMY;  Surgeon: Stacia Glendia BRAVO, MD;  Location: [facility];  Service: Gastroenterology;  Laterality: N/A;    ROS: Review of  Systems Negative except as stated above  PHYSICAL EXAM: BP 125/85   Pulse 75   Temp 98.2 F (36.8 C) (Oral)   Ht 5' 7 (1.702 m)   Wt 232 lb (105.2 kg)   LMP 05/29/2019   SpO2 98%   BMI 36.34 kg/m   Physical Exam  General appearance - alert, well appearing, and in no distress Mental status - normal mood, behavior, speech, dress, motor activity, and thought processes Chest - clear to auscultation, no wheezes, rales or rhonchi, symmetric air entry Heart - normal rate, regular rhythm, normal S1, S2, no murmurs, rubs, clicks or gallops Musculoskeletal -right shoulder: No point tenderness.  She has moderate discomfort with attempted passive range of motion in all directions.  Drop arm test positive. Extremities - peripheral pulses normal, no pedal edema, no clubbing or cyanosis      Latest Ref Rng & Units 11/14/2023   12:16 PM 10/31/2023    8:36 AM 05/27/2023   10:26 AM  CMP  Glucose 70 - 99 mg/dL 83  896  893   BUN 6 - 24 mg/dL 12  10  14    Creatinine 0.57 - 1.00 mg/dL 9.01  9.09  8.91   Sodium 134 - 144 mmol/L 141  137  140   Potassium 3.5 - 5.2 mmol/L 4.1  3.5  4.2   Chloride 96 - 106 mmol/L 104  103  104   CO2 20 - 29 mmol/L 22  22  23    Calcium 8.7 - 10.2 mg/dL 9.4  8.9  9.5   Total Protein 6.5 - 8.1 g/dL  6.9    Total Bilirubin 0.0 - 1.2 mg/dL  0.7    Alkaline Phos 38 - 126 U/L  47    AST 15 - 41 U/L  19    ALT 0 - 44 U/L  15     Lipid Panel  No results found for: CHOL, TRIG, HDL, CHOLHDL, VLDL, LDLCALC, LDLDIRECT  CBC    Component Value Date/Time   WBC 9.4 11/14/2023 1216   WBC 11.0 (H) 10/31/2023 0836   RBC 5.22 11/14/2023 1216   RBC 4.97 10/31/2023 0836   HGB 14.9 11/14/2023 1216  HCT 45.6 11/14/2023 1216   PLT 237 11/14/2023 1216   MCV 87 11/14/2023 1216   MCH 28.5 11/14/2023 1216   MCH 29.0 10/31/2023 0836   MCHC 32.7 11/14/2023 1216   MCHC 34.0 10/31/2023 0836   RDW 13.4 11/14/2023 1216   LYMPHSABS 3.0 11/14/2023 1216   MONOABS 0.7  12/24/2021 0334   EOSABS 0.1 11/14/2023 1216   BASOSABS 0.0 11/14/2023 1216    ASSESSMENT AND PLAN: 1. Essential hypertension (Primary) Not at goal.  Increase amlodipine  to 10 mg daily. - amLODipine  (NORVASC ) 10 MG tablet; Take 1 tablet (10 mg total) by mouth daily.  Dispense: 90 tablet; Refill: 1  2. Acute pain of right shoulder I suspect rotator cuff injury. Change her Advil to the 800 mg and have her take twice a day as needed.  Take with food.  I have also given some Tylenol  with codeine for her to take mainly at night since this is when the pain is worse.  Advised that the medication can cause constipation and drowsiness.  Advised that it is a controlled substance and we will be using it short-term only.  Referred to orthopedics. [place]  controlled substance reporting system reviewed. - AMB referral to orthopedics - acetaminophen -codeine (TYLENOL  #3) 300-30 MG tablet; Take 1-2 tablets by mouth at bedtime as needed for pain.  Dispense: 30 tablet; Refill: 0 - ibuprofen (ADVIL) 800 MG tablet; Take 1 tablet (800 mg total) by mouth 2 (two) times daily as needed. Take with food  Dispense: 40 tablet; Refill: 0  3. Prediabetes Patient advised to eliminate sugary drinks from the diet, cut back on portion sizes especially of white carbohydrates, eat more white lean meat like chicken [place] and seafood instead of beef or pork and incorporate fresh fruits and vegetables into the diet daily. Encouraged her to continue to move as much as she can. - POCT glycosylated hemoglobin (Hb A1C) - POCT glucose (manual entry)  4. History of colonic diverticulitis Schedule for colonoscopy   Patient was given the opportunity to ask questions.  Patient verbalized understanding of the plan and was able to repeat key elements of the plan.   This documentation was completed using Paediatric nurse.  Any transcriptional errors are unintentional.  Orders Placed This Encounter   Procedures   AMB referral to orthopedics   POCT glycosylated hemoglobin (Hb A1C)   POCT glucose (manual entry)     Requested Prescriptions   Signed Prescriptions Disp Refills   Vitamin D , Ergocalciferol , (DRISDOL ) 1.25 MG (50000 UNIT) CAPS capsule 12 capsule 1    Sig: Take 1 capsule (50,000 Units total) by mouth every 7 (seven) days.   amLODipine  (NORVASC ) 10 MG tablet 90 tablet 1    Sig: Take 1 tablet (10 mg total) by mouth daily.   acetaminophen -codeine (TYLENOL  #3) 300-30 MG tablet 30 tablet 0    Sig: Take 1-2 tablets by mouth at bedtime as needed for pain.   ibuprofen (ADVIL) 800 MG tablet 40 tablet 0    Sig: Take 1 tablet (800 mg total) by mouth 2 (two) times daily as needed. Take with food    Return in about 4 months (around 05/17/2024).  Barnie Louder, MD, FACP

## 2024-01-20 ENCOUNTER — Ambulatory Visit: Admitting: Gastroenterology

## 2024-01-20 ENCOUNTER — Encounter: Payer: Self-pay | Admitting: Gastroenterology

## 2024-01-20 VITALS — BP 159/89 | HR 63 | Temp 97.3°F | Resp 14 | Ht 67.0 in | Wt 236.5 lb

## 2024-01-20 DIAGNOSIS — K5730 Diverticulosis of large intestine without perforation or abscess without bleeding: Secondary | ICD-10-CM | POA: Diagnosis not present

## 2024-01-20 DIAGNOSIS — K2289 Other specified disease of esophagus: Secondary | ICD-10-CM

## 2024-01-20 DIAGNOSIS — K2970 Gastritis, unspecified, without bleeding: Secondary | ICD-10-CM | POA: Diagnosis not present

## 2024-01-20 DIAGNOSIS — K648 Other hemorrhoids: Secondary | ICD-10-CM | POA: Diagnosis not present

## 2024-01-20 DIAGNOSIS — K2950 Unspecified chronic gastritis without bleeding: Secondary | ICD-10-CM

## 2024-01-20 DIAGNOSIS — K635 Polyp of colon: Secondary | ICD-10-CM

## 2024-01-20 DIAGNOSIS — Z860100 Personal history of colon polyps, unspecified: Secondary | ICD-10-CM

## 2024-01-20 DIAGNOSIS — Z1211 Encounter for screening for malignant neoplasm of colon: Secondary | ICD-10-CM | POA: Diagnosis not present

## 2024-01-20 DIAGNOSIS — K921 Melena: Secondary | ICD-10-CM

## 2024-01-20 DIAGNOSIS — K2951 Unspecified chronic gastritis with bleeding: Secondary | ICD-10-CM | POA: Diagnosis not present

## 2024-01-20 DIAGNOSIS — Z860101 Personal history of adenomatous and serrated colon polyps: Secondary | ICD-10-CM | POA: Diagnosis not present

## 2024-01-20 DIAGNOSIS — Z860102 Personal history of hyperplastic colon polyps: Secondary | ICD-10-CM | POA: Diagnosis not present

## 2024-01-20 DIAGNOSIS — D175 Benign lipomatous neoplasm of intra-abdominal organs: Secondary | ICD-10-CM | POA: Diagnosis not present

## 2024-01-20 DIAGNOSIS — B9681 Helicobacter pylori [H. pylori] as the cause of diseases classified elsewhere: Secondary | ICD-10-CM | POA: Diagnosis not present

## 2024-01-20 DIAGNOSIS — K634 Enteroptosis: Secondary | ICD-10-CM | POA: Diagnosis not present

## 2024-01-20 DIAGNOSIS — K639 Disease of intestine, unspecified: Secondary | ICD-10-CM | POA: Diagnosis not present

## 2024-01-20 DIAGNOSIS — K5710 Diverticulosis of small intestine without perforation or abscess without bleeding: Secondary | ICD-10-CM | POA: Diagnosis not present

## 2024-01-20 DIAGNOSIS — K56699 Other intestinal obstruction unspecified as to partial versus complete obstruction: Secondary | ICD-10-CM | POA: Diagnosis not present

## 2024-01-20 MED ORDER — SODIUM CHLORIDE 0.9 % IV SOLN: 500.0000 mL | INTRAVENOUS | Status: DC

## 2024-01-20 NOTE — Progress Notes (Unsigned)
 1642:  Switched scopes to ultraslim

## 2024-01-20 NOTE — Progress Notes (Unsigned)
 History and Physical Interval Note:  01/20/2024 4:07 PM  Christina Cannon  has presented today for endoscopic procedure(s), with the diagnosis of  Encounter Diagnoses  Name Primary?   History of colonic polyps Yes   Melena   .  The various methods of evaluation and treatment have been discussed with the patient and/or family. After consideration of risks, benefits and other options for treatment, the patient has consented to  the endoscopic procedure(s).   The patient's history has been reviewed, patient examined, no change in status, stable for endoscopic procedure(s).  I have reviewed the patient's chart and labs.  Questions were answered to the patient's satisfaction.     Jaremy Nosal E. Stacia, MD [facility]

## 2024-01-20 NOTE — Patient Instructions (Addendum)
-  Handout on gastritis provided -Await pathology results  YOU HAD AN ENDOSCOPIC PROCEDURE TODAY AT THE [facility]:   Refer to the procedure report that was given to you for any specific questions about what was found during the examination.  If the procedure report does not answer your questions, please call your gastroenterologist to clarify.  If you requested that your care partner not be given the details of your procedure findings, then the procedure report has been included in a sealed envelope for you to review at your convenience later.  YOU SHOULD EXPECT: Some feelings of bloating in the abdomen. Passage of more gas than usual.  Walking can help get rid of the air that was put into your GI tract during the procedure and reduce the bloating. If you had a lower endoscopy (such as a colonoscopy or flexible sigmoidoscopy) you may notice spotting of blood in your stool or on the toilet paper. If you underwent a bowel prep for your procedure, you may not have a normal bowel movement for a few days.  Please Note:  You might notice some irritation and congestion in your nose or some drainage.  This is from the oxygen used during your procedure.  There is no need for concern and it should clear up in a day or so.  SYMPTOMS TO REPORT IMMEDIATELY:  Following lower endoscopy (colonoscopy or flexible sigmoidoscopy):  Excessive amounts of blood in the stool  Significant tenderness or worsening of abdominal pains  Swelling of the abdomen that is new, acute  Fever of 100F or higher  Following upper endoscopy (EGD)  Vomiting of blood or coffee ground material  New chest pain or pain under the shoulder blades  Painful or persistently difficult swallowing  New shortness of breath  Fever of 100F or higher  Black, tarry-looking stools  For urgent or emergent issues, a gastroenterologist can be reached at any hour by calling [phone]. Do not use MyChart messaging for urgent concerns.     DIET:  Clear liquid diet today for repeat colonoscopy tomorrow.  ACTIVITY:  You should plan to take it easy for the rest of today and you should NOT DRIVE or use heavy machinery until tomorrow (because of the sedation medicines used during the test).    FOLLOW UP: Our staff will call the number listed on your records the next business day following your procedure.  We will call around 7:15- 8:00 am to check on you and address any questions or concerns that you may have regarding the information given to you following your procedure. If we do not reach you, we will leave a message.     If any biopsies were taken you will be contacted by phone or by letter within the next 1-3 weeks.  Please call us  at [phone] if you have not heard about the biopsies in 3 weeks.    SIGNATURES/CONFIDENTIALITY: You and/or your care partner have signed paperwork which will be entered into your electronic medical record.  These signatures attest to the fact that that the information above on your After Visit Summary has been reviewed and is understood.  Full responsibility of the confidentiality of this discharge information lies with you and/or your care-partner.

## 2024-01-20 NOTE — Op Note (Signed)
 [facility] Patient Name: Christina Cannon Procedure Date: 01/20/2024 4:10 PM MRN: [id number] Endoscopist: Glendia E. Stacia , MD, [id number] Age: 58 Referring MD:  Date of Birth: [birth year] Gender: Female Account #: [id number] Procedure:                Upper GI endoscopy Indications:              Melena Medicines:                Monitored Anesthesia Care Procedure:                Pre-Anesthesia Assessment:                           - Prior to the procedure, a History and Physical                            was performed, and patient medications and                            allergies were reviewed. The patient's tolerance of                            previous anesthesia was also reviewed. The risks                            and benefits of the procedure and the sedation                            options and risks were discussed with the patient.                            All questions were answered, and informed consent                            was obtained. Prior Anticoagulants: The patient has                            taken no anticoagulant or antiplatelet agents. ASA                            Grade Assessment: II - A patient with mild systemic                            disease. After reviewing the risks and benefits,                            the patient was deemed in satisfactory condition to                            undergo the procedure.                           After obtaining informed consent, the endoscope was  passed under direct vision. Throughout the                            procedure, the patient's blood pressure, pulse, and                            oxygen saturations were monitored continuously. The                            Olympus Scope [id number] was introduced through the                            mouth, and advanced to the third part of duodenum.                            The upper GI endoscopy was  accomplished without                            difficulty. The patient tolerated the procedure                            well. Scope In: Scope Out: Findings:                 The Z-line was irregular.                           The exam of the esophagus was otherwise normal.                           Diffuse moderate inflammation characterized by                            erythema and granularity was found in the gastric                            body. Biopsies were taken with a cold forceps for                            Helicobacter pylori testing. Estimated blood loss                            was minimal.                           The exam of the stomach was otherwise normal.                           Biopsies were taken with a cold forceps in the                            gastric antrum for Helicobacter pylori testing.                            Estimated blood loss was minimal.  The examined duodenum was normal.                           A medium diverticulum was found in the second                            portion of the duodenum. Complications:            No immediate complications. Estimated Blood Loss:     Estimated blood loss was minimal. Impression:               - Z-line irregular.                           - Gastritis. Biopsied.                           - Normal examined duodenum.                           - Duodenal diverticulum.                           - Biopsies were taken with a cold forceps for                            Helicobacter pylori testing. Recommendation:           - Patient has a contact number available for                            emergencies. The signs and symptoms of potential                            delayed complications were discussed with the                            patient. Return to normal activities tomorrow.                            Written discharge instructions were provided to the                             patient.                           - Resume previous diet.                           - Continue present medications.                           - Await pathology results. Sumner Boesch E. Stacia, MD 01/20/2024 5:09:16 PM This report has been signed electronically.

## 2024-01-20 NOTE — Op Note (Signed)
 [facility] Patient Name: Christina Cannon Procedure Date: 01/20/2024 4:01 PM MRN: [id number] Endoscopist: Glendia E. Stacia , MD, [id number] Age: 58 Referring MD:  Date of Birth: 1965-07-29 Gender: Female Account #: [id number] Procedure:                Colonoscopy Indications:              High risk colon cancer surveillance: Personal                            history of adenoma with villous component, history                            of large hyperplastic polypoid lesions in the                            sigmoid colon Medicines:                Monitored Anesthesia Care Procedure:                Pre-Anesthesia Assessment:                           - Prior to the procedure, a History and Physical                            was performed, and patient medications and                            allergies were reviewed. The patient's tolerance of                            previous anesthesia was also reviewed. The risks                            and benefits of the procedure and the sedation                            options and risks were discussed with the patient.                            All questions were answered, and informed consent                            was obtained. Prior Anticoagulants: The patient has                            taken no anticoagulant or antiplatelet agents. ASA                            Grade Assessment: II - A patient with mild systemic                            disease. After reviewing the risks and benefits,  the patient was deemed in satisfactory condition to                            undergo the procedure.                           After obtaining informed consent, the colonoscope                            was passed under direct vision. Throughout the                            procedure, the patient's blood pressure, pulse, and                            oxygen saturations were monitored  continuously. The                            Olympus Scope SN: [id number] was introduced through                            the anus and advanced to the the cecum, identified                            by appendiceal orifice and ileocecal valve. The                            colonoscopy was performed with moderate difficulty                            due to multiple diverticula in the colon, bowel                            stenosis and poor bowel prep. Successful completion                            of the procedure was aided by using manual pressure                            and withdrawing the scope and replacing with the                            UltraSlim scope. The patient tolerated the                            procedure well. The quality of the bowel                            preparation was poor. The ileocecal valve,                            appendiceal orifice, and rectum were photographed.  The bowel preparation used was Miralax and SUPREP                            via extended prep with split dose instruction. Scope In: 4:32:37 PM Scope Out: 5:02:23 PM Scope Withdrawal Time: 0 hours 9 minutes 14 seconds  Total Procedure Duration: 0 hours 29 minutes 46 seconds  Findings:                 The perianal and digital rectal examinations were                            normal. Pertinent negatives include normal                            sphincter tone and no palpable rectal lesions.                           A medium polyp was found in the sigmoid colon.                            Biopsies were taken with a cold forceps for                            histology. Estimated blood loss was minimal.                           A benign-appearing, intrinsic moderate stenosis was                            found in the sigmoid colon and was traversed.                           Many medium-mouthed and small-mouthed diverticula                            were  found in the sigmoid colon and descending                            colon. There was narrowing of the colon in                            association with the diverticular opening.                           The ileocecal valve was moderately lipomatous.                           Non-bleeding internal hemorrhoids were found during                            retroflexion.                           No additional abnormalities were found on  retroflexion. Complications:            No immediate complications. Estimated Blood Loss:     Estimated blood loss was minimal. Impression:               - Preparation of the colon was poor.                           - One medium-sized erythematous polypoid in the                            sigmoid colon. Biopsied.                           - Stricture in the sigmoid colon.                           - Severe diverticulosis in the sigmoid colon and in                            the descending colon. There was narrowing of the                            colon in association with the diverticular opening.                           - Lipomatous ileocecal valve.                           - Non-bleeding internal hemorrhoids.                           - Very limited exam due to excessive stool burden                            and diverticular disease with luminal narrowing.                            The sigmoid colon again noted to be narrowed and                            constricted. An erythematous polypoid lesion was                            seen and was biopsied. I suspect this is                            hyperplastic/inflammatory Recommendation:           - Patient has a contact number available for                            emergencies. The signs and symptoms of potential                            delayed complications were discussed with the  patient. Return to normal activities tomorrow.                             Written discharge instructions were provided to the                            patient.                           - Resume previous diet.                           - Continue present medications.                           - Repeat colonoscopy at appointment to be scheduled                            because the bowel preparation was poor despite a                            2-day bowel prep.                           - Suspect patient's symptoms may be related to                            symptomatic diverticular disease.                           - Recommend 2 days of clear liquid diet with 2-day                            bowel prep preceded by 1 week of daily MiraLax for                            next colonoscopy. Selicia Windom E. Stacia, MD 01/20/2024 5:21:48 PM This report has been signed electronically.

## 2024-01-20 NOTE — Progress Notes (Unsigned)
 Vss nad trans to pacu

## 2024-01-21 ENCOUNTER — Encounter: Payer: Self-pay | Admitting: Gastroenterology

## 2024-01-21 ENCOUNTER — Ambulatory Visit (AMBULATORY_SURGERY_CENTER): Admitting: Gastroenterology

## 2024-01-21 VITALS — BP 153/94 | HR 66 | Temp 97.3°F | Resp 12 | Ht 67.0 in | Wt 236.0 lb

## 2024-01-21 DIAGNOSIS — Z1211 Encounter for screening for malignant neoplasm of colon: Secondary | ICD-10-CM | POA: Diagnosis not present

## 2024-01-21 DIAGNOSIS — K640 First degree hemorrhoids: Secondary | ICD-10-CM | POA: Diagnosis not present

## 2024-01-21 DIAGNOSIS — K633 Ulcer of intestine: Secondary | ICD-10-CM | POA: Diagnosis not present

## 2024-01-21 DIAGNOSIS — Z860100 Personal history of colon polyps, unspecified: Secondary | ICD-10-CM

## 2024-01-21 DIAGNOSIS — K634 Enteroptosis: Secondary | ICD-10-CM | POA: Diagnosis not present

## 2024-01-21 DIAGNOSIS — K639 Disease of intestine, unspecified: Secondary | ICD-10-CM | POA: Diagnosis not present

## 2024-01-21 DIAGNOSIS — K635 Polyp of colon: Secondary | ICD-10-CM

## 2024-01-21 DIAGNOSIS — K5730 Diverticulosis of large intestine without perforation or abscess without bleeding: Secondary | ICD-10-CM | POA: Diagnosis not present

## 2024-01-21 DIAGNOSIS — Z860101 Personal history of adenomatous and serrated colon polyps: Secondary | ICD-10-CM | POA: Diagnosis not present

## 2024-01-21 DIAGNOSIS — D125 Benign neoplasm of sigmoid colon: Secondary | ICD-10-CM | POA: Diagnosis not present

## 2024-01-21 MED ORDER — SODIUM CHLORIDE 0.9 % IV SOLN: 500.0000 mL | Freq: Once | INTRAVENOUS | Status: DC

## 2024-01-21 NOTE — Patient Instructions (Addendum)
 Recommend high fiber diet Continue present medications, recommend high fiber supplement Await pathology results  Handouts/information given for polyps, high fiber diet, fiber supplement, diverticulosis and hemorrhoids  YOU HAD AN ENDOSCOPIC PROCEDURE TODAY AT THE [facility]:   Refer to the procedure report that was given to you for any specific questions about what was found during the examination.  If the procedure report does not answer your questions, please call your gastroenterologist to clarify.  If you requested that your care partner not be given the details of your procedure findings, then the procedure report has been included in a sealed envelope for you to review at your convenience later.  YOU SHOULD EXPECT: Some feelings of bloating in the abdomen. Passage of more gas than usual.  Walking can help get rid of the air that was put into your GI tract during the procedure and reduce the bloating. If you had a lower endoscopy (such as a colonoscopy or flexible sigmoidoscopy) you may notice spotting of blood in your stool or on the toilet paper. If you underwent a bowel prep for your procedure, you may not have a normal bowel movement for a few days.  Please Note:  You might notice some irritation and congestion in your nose or some drainage.  This is from the oxygen used during your procedure.  There is no need for concern and it should clear up in a day or so.  SYMPTOMS TO REPORT IMMEDIATELY: Following lower endoscopy (colonoscopy):  Excessive amounts of blood in the stool  Significant tenderness or worsening of abdominal pains  Swelling of the abdomen that is new, acute  Fever of 100F or higher For urgent or emergent issues, a gastroenterologist can be reached at any hour by calling [phone]. Do not use MyChart messaging for urgent concerns.   DIET:  We do recommend a small meal at first, but then you may proceed to your regular diet.  Drink plenty of fluids but  you should avoid alcoholic beverages for 24 hours.  ACTIVITY:  You should plan to take it easy for the rest of today and you should NOT DRIVE or use heavy machinery until tomorrow (because of the sedation medicines used during the test).    FOLLOW UP: Our staff will call the number listed on your records the next business day following your procedure.  We will call around 7:15- 8:00 am to check on you and address any questions or concerns that you may have regarding the information given to you following your procedure. If we do not reach you, we will leave a message.     If any biopsies were taken you will be contacted by phone or by letter within the next 1-3 weeks.  Please call us  at [phone] if you have not heard about the biopsies in 3 weeks.   SIGNATURES/CONFIDENTIALITY: You and/or your care partner have signed paperwork which will be entered into your electronic medical record.  These signatures attest to the fact that that the information above on your After Visit Summary has been reviewed and is understood.  Full responsibility of the confidentiality of this discharge information lies with you and/or your care-partner.

## 2024-01-21 NOTE — Progress Notes (Signed)
 Sedate, gd SR, tolerated procedure well, VSS, report to RN

## 2024-01-21 NOTE — Progress Notes (Signed)
 History and Physical Interval Note:  01/21/2024 10:43 AM  Christina Cannon  has presented today for endoscopic procedure(s), with the diagnosis of  Encounter Diagnosis  Name Primary?   History of colonic polyps Yes  .  The various methods of evaluation and treatment have been discussed with the patient and/or family. After consideration of risks, benefits and other options for treatment, the patient has consented to  the endoscopic procedure(s).   The patient's history has been reviewed, patient examined, no change in status, stable for endoscopic procedure(s).  I have reviewed the patient's chart and labs.  Questions were answered to the patient's satisfaction.    Patient's colonoscopy yesterday limited by poor prep   [name], MD [facility]

## 2024-01-21 NOTE — Progress Notes (Signed)
 Pt's states no medical or surgical changes since previsit or office visit.

## 2024-01-21 NOTE — Progress Notes (Signed)
 Called to room to assist during endoscopic procedure.  Patient ID and intended procedure confirmed with present staff. Received instructions for my participation in the procedure from the performing physician.

## 2024-01-21 NOTE — Op Note (Signed)
 [facility] Patient Name: Christina Cannon Procedure Date: 01/21/2024 10:40 AM MRN: [id number] Endoscopist: Glendia E. Stacia , MD, [id number] Age: 58 Referring MD:  Date of Birth: 06-16-1966 Gender: Female Account #: [id number] Procedure:                Colonoscopy Indications:              High risk colon cancer surveillance: Personal                            history of adenoma with villous component, history                            of large sigmoid hyperplastic polyps Medicines:                Monitored Anesthesia Care Procedure:                Pre-Anesthesia Assessment:                           - Prior to the procedure, a History and Physical                            was performed, and patient medications and                            allergies were reviewed. The patient's tolerance of                            previous anesthesia was also reviewed. The risks                            and benefits of the procedure and the sedation                            options and risks were discussed with the patient.                            All questions were answered, and informed consent                            was obtained. Prior Anticoagulants: The patient has                            taken no anticoagulant or antiplatelet agents. ASA                            Grade Assessment: II - A patient with mild systemic                            disease. After reviewing the risks and benefits,                            the patient was deemed in satisfactory condition to  undergo the procedure.                           After obtaining informed consent, the colonoscope                            was passed under direct vision. Throughout the                            procedure, the patient's blood pressure, pulse, and                            oxygen saturations were monitored continuously. The                            PCF-H190TL  Slim SN [id number] was introduced through                            the anus and advanced to the the terminal ileum,                            with identification of the appendiceal orifice and                            IC valve. The colonoscopy was performed with                            moderate difficulty due to narrowing of the sigmoid                            colon. The patient tolerated the procedure well.                            The quality of the bowel preparation was adequate.                            The terminal ileum, ileocecal valve, appendiceal                            orifice, and rectum were photographed. The bowel                            preparation used was Miralax and SUPREP via                            extended prep with split dose instruction. Scope In: 10:53:54 AM Scope Out: 11:29:54 AM Scope Withdrawal Time: 0 hours 20 minutes 47 seconds  Total Procedure Duration: 0 hours 36 minutes 0 seconds  Findings:                 The perianal and digital rectal examinations were                            normal. Pertinent negatives include normal  sphincter tone and no palpable rectal lesions.                           A large polypoid lesion (2cm?) was found in the                            sigmoid colon. The polyp was semi-pedunculated.                            Biopsies were taken with a cold forceps for                            histology. Estimated blood loss was minimal.                           A 10 mm erythematous polypoid lesion was found in                            the sigmoid colon. The polyp was semi-pedunculated.                            Biopsies were taken with a cold forceps for                            histology. Estimated blood loss was minimal.                           Many large-mouthed, medium-mouthed and                            small-mouthed diverticula were found in the sigmoid                             colon, descending colon, transverse colon and                            ascending colon. There was narrowing of the colon                            in association with the diverticular opening.                            Biopsies were taken with a cold forceps for                            histology in the sigmoid and descending colon.                            Estimated blood loss was minimal.                           The exam was otherwise normal throughout the  examined colon.                           The terminal ileum appeared normal.                           Non-bleeding internal hemorrhoids were found during                            retroflexion. The hemorrhoids were Grade I                            (internal hemorrhoids that do not prolapse).                           No additional abnormalities were found on                            retroflexion. Complications:            No immediate complications. Estimated Blood Loss:     Estimated blood loss was minimal. Impression:               - One large polyp in the sigmoid colon. Biopsied.                           - One 10 mm polyp in the sigmoid colon. Biopsied.                           - Severe diverticulosis in the sigmoid colon, in                            the descending colon, in the transverse colon and                            in the ascending colon. There was narrowing of the                            colon in association with the diverticular opening.                           - The examined portion of the ileum was normal.                           - Non-bleeding internal hemorrhoids.                           The sigmoid colon was narrowed from about 20 cm to                            30 cm. Visualization was difficult and the mucosa                            appeared redundant with prolapsing effect. Biopsies  of the sigmoid and descending colon  were taken to                            assess for chronic inflammation/SCAD.                           Both of the large sigmoid polyps appeared                            inflammatory, not adenomatous, and thus were                            biopsied, not removed.                           I suspect the patient's lower GI symptoms are                            related to symptomatic diverticular disease and                            colonic narrowing. Recommendation:           - Patient has a contact number available for                            emergencies. The signs and symptoms of potential                            delayed complications were discussed with the                            patient. Return to normal activities tomorrow.                            Written discharge instructions were provided to the                            patient.                           - Resume previous diet.                           - Continue present medications.                           - Await pathology results.                           - Repeat colonoscopy (date not yet determined) for                            surveillance based on pathology results.                           - Recommend high fiber  diet and daily fiber                            supplement                           - Further recommendations based on pathology                            results. Benaiah Behan E. Stacia, MD 01/21/2024 11:45:05 AM This report has been signed electronically.

## 2024-01-22 ENCOUNTER — Telehealth: Payer: Self-pay | Admitting: *Deleted

## 2024-01-22 NOTE — Telephone Encounter (Signed)
  Follow up Call-     01/21/2024   10:27 AM 01/20/2024    2:57 PM 05/10/2022    9:12 AM  Call back number  Post procedure Call Back phone  # [phone]  Permission to leave phone message Yes Yes Yes     Patient questions:  Do you have a fever, pain , or abdominal swelling? No. Pain Score  0 *  Have you tolerated food without any problems? Yes.    Have you been able to return to your normal activities? Yes.    Do you have any questions about your discharge instructions: Diet   No. Medications  No. Follow up visit  No.  Do you have questions or concerns about your Care? No.  Actions: * If pain score is 4 or above: No action needed, pain <4.

## 2024-01-23 ENCOUNTER — Ambulatory Visit: Admitting: Orthopaedic Surgery

## 2024-01-23 ENCOUNTER — Other Ambulatory Visit (INDEPENDENT_AMBULATORY_CARE_PROVIDER_SITE_OTHER): Payer: Self-pay

## 2024-01-23 DIAGNOSIS — M25511 Pain in right shoulder: Secondary | ICD-10-CM

## 2024-01-23 LAB — SURGICAL PATHOLOGY

## 2024-01-24 NOTE — Progress Notes (Signed)
 Office Visit Note   Patient: Christina Cannon           Date of Birth: [birth year]           MRN: [id number] Visit Date: 01/23/2024              Requested by: Vicci Barnie NOVAK, MD [address] [place] [address] [place],  [place] [address] PCP: Vicci Barnie NOVAK, MD   Assessment & Plan: Visit Diagnoses:  1. Right shoulder pain, unspecified chronicity     Plan: History of Present Illness Christina Cannon is a 58 year old female who presents with right shoulder pain following a fall.  She has experienced right shoulder pain since a fall in May, primarily located at the anterior arm and shoulder. Pain worsens with movements such as reaching overhead, which she performs slowly and sometimes with assistance. Significant pain occurs when pushing against resistance with her elbow.  Post-fall, she was unable to move her arm the following morning. At night, she sleeps holding her arm in a specific position to manage pain. As she is right-hand dominant, this condition significantly impacts her daily activities.  Physical Exam MUSCULOSKELETAL: Right shoulder flexibility normal. Significant weakness and pain with MMT of rotator cuff.   Results RADIOLOGY Right shoulder X-ray: Degenerative changes, arthritis in the shoulder joint, bone spurs, no fractures or dislocations (01/23/2024)  Assessment and Plan Rotator cuff tear Suspected traumatic rotator cuff tear in the right shoulder with pain and movement difficulty. Extent of tear undetermined. - Order MRI of the right shoulder to assess the extent of the rotator cuff tear.  Shoulder arthritis X-rays show degenerative changes with bone spurs, no acute abnormalities.  Follow-Up Instructions: No follow-ups on file.   Orders:  Orders Placed This Encounter  Procedures   XR Shoulder Right   MR SHOULDER RIGHT WO CONTRAST   No orders of the defined types were placed in this encounter.     Procedures: No procedures  performed   Clinical Data: No additional findings.   Subjective: Chief Complaint  Patient presents with   Right Shoulder - Pain    HPI  Review of Systems  Constitutional: Negative.   HENT: Negative.    Eyes: Negative.   Respiratory: Negative.    Cardiovascular: Negative.   Endocrine: Negative.   Musculoskeletal: Negative.   Neurological: Negative.   Hematological: Negative.   Psychiatric/Behavioral: Negative.    All other systems reviewed and are negative.    Objective: Vital Signs: LMP 05/29/2019   Physical Exam Vitals and nursing note reviewed.  Constitutional:      Appearance: She is well-developed.  HENT:     Head: Atraumatic.     Nose: Nose normal.  Eyes:     Extraocular Movements: Extraocular movements intact.  Cardiovascular:     Pulses: Normal pulses.  Pulmonary:     Effort: Pulmonary effort is normal.  Abdominal:     Palpations: Abdomen is soft.  Musculoskeletal:     Cervical back: Neck supple.  Skin:    General: Skin is warm.     Capillary Refill: Capillary refill takes less than 2 seconds.  Neurological:     Mental Status: She is alert. Mental status is at baseline.  Psychiatric:        Behavior: Behavior normal.        Thought Content: Thought content normal.        Judgment: Judgment normal.     Ortho Exam  Specialty Comments:  No specialty  comments available.  Imaging: XR Shoulder Right Result Date: 01/23/2024 X-rays of the right shoulder show degenerative changes of glenohumeral joint with inferior glenoid osteophyte.  There is undersurface spurring of the acromion    PMFS History: Patient Active Problem List   Diagnosis Date Noted   Vitamin D  deficiency, unspecified 05/28/2023   Prediabetes 05/28/2023   Polyp of colon 07/19/2022   Prolonged QT interval 12/21/2021   Colitis 12/21/2021   Diverticulitis 12/20/2021   Hypokalemia 12/20/2021   Obesity (BMI 30-39.9) 12/20/2021   Tobacco use 12/20/2021   History of colonic  diverticulitis 07/09/2017   Primary osteoarthritis of both knees 07/09/2017   Primary osteoarthritis of both ankles 07/09/2017   Tobacco dependence 07/09/2017   Elevated blood pressure reading 07/09/2017   Leukocytosis 06/10/2017   Skin tag of vulva 08/10/2015   Past Medical History:  Diagnosis Date   Diverticulitis    Hypertension     Family History  Problem Relation Age of Onset   Diabetes Mother    Hypertension Mother    Diabetes Father    Hypertension Father    Heart failure Father    Multiple sclerosis Sister    Colon cancer Neg Hx    Colon polyps Neg Hx    Esophageal cancer Neg Hx    Rectal cancer Neg Hx    Stomach cancer Neg Hx     Past Surgical History:  Procedure Laterality Date   BIOPSY  07/19/2022   Procedure: BIOPSY;  Surgeon: Stacia Glendia BRAVO, MD;  Location: [facility];  Service: Gastroenterology;;   CHOLECYSTECTOMY  1988   COLONOSCOPY WITH PROPOFOL  N/A 07/19/2022   Procedure: COLONOSCOPY WITH PROPOFOL ;  Surgeon: Stacia Glendia BRAVO, MD;  Location: [facility];  Service: Gastroenterology;  Laterality: N/A;   HAND SURGERY  1997   POLYPECTOMY N/A 07/19/2022   Procedure: POLYPECTOMY;  Surgeon: Stacia Glendia BRAVO, MD;  Location: [facility];  Service: Gastroenterology;  Laterality: N/A;   Social History   Occupational History   Not on file  Tobacco Use   Smoking status: Former    Types: Cigarettes    Passive exposure: Current   Smokeless tobacco: Never   Tobacco comments:    Former occasional smoker started around age 49,   Vaping Use   Vaping status: Never Used  Substance and Sexual Activity   Alcohol use: Yes    Comment: occasionally   Drug use: Yes    Types: Marijuana    Comment: last used 01/17/24   Sexual activity: Yes    Partners: Male    Birth control/protection: None, Post-menopausal    Comment: menarche 58yo, sexual debut 58yo

## 2024-01-25 ENCOUNTER — Ambulatory Visit: Payer: Self-pay | Admitting: Orthopaedic Surgery

## 2024-01-25 ENCOUNTER — Ambulatory Visit
Admission: RE | Admit: 2024-01-25 | Discharge: 2024-01-25 | Disposition: A | Discharge status: Home / Self Care | Source: Ambulatory Visit | Attending: Orthopaedic Surgery | Admitting: Orthopaedic Surgery

## 2024-01-25 DIAGNOSIS — M25511 Pain in right shoulder: Secondary | ICD-10-CM

## 2024-01-25 DIAGNOSIS — M75111 Incomplete rotator cuff tear or rupture of right shoulder, not specified as traumatic: Secondary | ICD-10-CM | POA: Diagnosis not present

## 2024-01-25 DIAGNOSIS — M19011 Primary osteoarthritis, right shoulder: Secondary | ICD-10-CM | POA: Diagnosis not present

## 2024-01-25 NOTE — Progress Notes (Signed)
 Needs f/u appt to discuss surgery.  Thanks.

## 2024-01-27 LAB — SURGICAL PATHOLOGY

## 2024-01-30 ENCOUNTER — Ambulatory Visit: Payer: Self-pay | Admitting: Gastroenterology

## 2024-01-30 DIAGNOSIS — A048 Other specified bacterial intestinal infections: Secondary | ICD-10-CM

## 2024-01-30 MED ORDER — METRONIDAZOLE 250 MG PO TABS: 250.0000 mg | ORAL_TABLET | Freq: Four times a day (QID) | ORAL | 0 refills | Status: AC

## 2024-01-30 MED ORDER — BISMUTH SUBSALICYLATE 262 MG PO CHEW: 524.0000 mg | CHEWABLE_TABLET | Freq: Four times a day (QID) | ORAL | 0 refills | Status: AC

## 2024-01-30 MED ORDER — OMEPRAZOLE 20 MG PO CPDR: 20.0000 mg | DELAYED_RELEASE_CAPSULE | Freq: Two times a day (BID) | ORAL | 0 refills | Status: DC

## 2024-01-30 MED ORDER — DOXYCYCLINE HYCLATE 100 MG PO TABS: 100.0000 mg | ORAL_TABLET | Freq: Two times a day (BID) | ORAL | 0 refills | Status: AC

## 2024-01-30 NOTE — Progress Notes (Signed)
 Christina Cannon,  The biopsies of the large polyps again showed hyperplastic changes with prolapse effect; no precancerous changes. The biopsies of your colon did not show any evidence of chronic inflammation to suggest chronic diverticulitis or SCAD. Please take metamucil at least once a day.  Plan to repeat colonoscopy in 3 years given your history of tubulovillous polyp in 2023

## 2024-01-30 NOTE — Progress Notes (Signed)
 Christina Cannon,  The biopsies from your recent upper GI Endoscopy were notable for H. Pylori gastritis.  H. pylori is a common bacteria which can cause chronic symptoms of abdominal pain, nausea and bloating.  It can also cause iron deficiency anemia as well as increase the risk of stomach ulcers and stomach cancer.  We need to eradicate the bacteria.  Sometimes the bacteria is very difficult to eradicate, so it is  important to take the medications as directed. Please take the medications below:  1) Omeprazole  20 mg 2 times a day x 14 d 2) Pepto Bismol 2 tabs (262 mg each) 4 times a day x 14 d 3) Metronidazole  250 mg 4 times a day x 14 d 4) doxycycline  100 mg 2 times a day x 14 d  4 weeks after treatment completed, check H. Pylori stool antigen to confirm eradication (must be off acid suppression therapy for 2 weeks prior to specimen submission)  Dx: H. Pylori gastritis  The biopsies of the polypoid lesion in the sigmoid colon showed hyperplastic/prolapse type changes, as expected.  No precancerous changes seen.   Team,  Please place the prescriptions and order for stool antigen as outlined above.

## 2024-01-31 ENCOUNTER — Other Ambulatory Visit: Payer: Self-pay | Admitting: Radiology

## 2024-01-31 ENCOUNTER — Other Ambulatory Visit (HOSPITAL_COMMUNITY): Payer: Self-pay

## 2024-01-31 ENCOUNTER — Encounter (HOSPITAL_COMMUNITY): Payer: Self-pay

## 2024-01-31 DIAGNOSIS — N950 Postmenopausal bleeding: Secondary | ICD-10-CM

## 2024-01-31 NOTE — Telephone Encounter (Signed)
 Not indicated

## 2024-01-31 NOTE — Telephone Encounter (Signed)
.  Med refill request: Misoprostol  200 MCG Last OV: 01/08/24 Next OV: 02/12/24 Last MMG (if hormonal med) Refill authorized: Please Advise?

## 2024-02-07 ENCOUNTER — Other Ambulatory Visit (HOSPITAL_COMMUNITY): Payer: Self-pay

## 2024-02-11 ENCOUNTER — Ambulatory Visit (INDEPENDENT_AMBULATORY_CARE_PROVIDER_SITE_OTHER): Admitting: Orthopaedic Surgery

## 2024-02-11 DIAGNOSIS — S46011A Strain of muscle(s) and tendon(s) of the rotator cuff of right shoulder, initial encounter: Secondary | ICD-10-CM | POA: Diagnosis not present

## 2024-02-11 MED ORDER — TRAMADOL HCL 50 MG PO TABS: 50.0000 mg | ORAL_TABLET | Freq: Every day | ORAL | 0 refills | Status: AC | PRN

## 2024-02-11 NOTE — Progress Notes (Signed)
 Office Visit Note   Patient: Christina Cannon           Date of Birth: 06/17/1966           MRN: [id number] Visit Date: 02/11/2024              Requested by: Vicci Barnie NOVAK, MD [address] [place] [address] [place],  [place] [address] PCP: Vicci Barnie NOVAK, MD   Assessment & Plan: Visit Diagnoses:  1. Traumatic complete tear of right rotator cuff, initial encounter     Plan: History of Present Illness Christina Cannon is a 58 year old female who presents with shoulder pain following a fall resulting in a complete rotator cuff tear.  The MRI of her shoulder shows a complete tear of the supraspinatus with a retraction of approximately two centimeters. This injury is due to a recent fall.  She experiences significant shoulder pain that affects her sleep. The pain is persistent and difficult to relieve. She is considering using tramadol  again for pain management and to improve sleep.  She works as a Secondary school teacher, primarily Contractor work, and is concerned about the impact of her shoulder injury on her ability to work, especially during the recovery period post-surgery.  She has high blood pressure but no diabetes, heart problems, or smoking history.  Results RADIOLOGY Shoulder MRI: Complete tear of the rotator cuff with 2 cm retraction of the supraspinatus tendon.  Assessment and Plan Complete rotator cuff tear MRI confirmed complete supraspinatus tendon tear with 2 cm retraction post-fall. - Recommend surgical repair. - Advise FMLA paperwork preparation. - Surgery scheduler to contact within 1-2 weeks. - Discussed tramadol  benefits for pain relief and sleep improvement.  Follow-Up Instructions: No follow-ups on file.   Orders:  No orders of the defined types were placed in this encounter.  Meds ordered this encounter  Medications   traMADol  (ULTRAM ) 50 MG tablet    Sig: Take 1-2 tablets (50-100 mg total) by mouth daily as needed.    Dispense:  20  tablet    Refill:  0      Procedures: No procedures performed   Clinical Data: No additional findings.   Subjective: Chief Complaint  Patient presents with   Right Shoulder - Follow-up    MRI review    HPI  Review of Systems   Objective: Vital Signs: LMP 05/29/2019   Physical Exam  Ortho Exam  Specialty Comments:  No specialty comments available.  Imaging: No results found.   PMFS History: Patient Active Problem List   Diagnosis Date Noted   Traumatic complete tear of right rotator cuff 02/11/2024   Vitamin D  deficiency, unspecified 05/28/2023   Prediabetes 05/28/2023   Polyp of colon 07/19/2022   Prolonged QT interval 12/21/2021   Colitis 12/21/2021   Diverticulitis 12/20/2021   Hypokalemia 12/20/2021   Obesity (BMI 30-39.9) 12/20/2021   Tobacco use 12/20/2021   History of colonic diverticulitis 07/09/2017   Primary osteoarthritis of both knees 07/09/2017   Primary osteoarthritis of both ankles 07/09/2017   Tobacco dependence 07/09/2017   Elevated blood pressure reading 07/09/2017   Leukocytosis 06/10/2017   Skin tag of vulva 08/10/2015   Past Medical History:  Diagnosis Date   Diverticulitis    Hypertension     Family History  Problem Relation Age of Onset   Diabetes Mother    Hypertension Mother    Diabetes Father    Hypertension Father    Heart failure Father  Multiple sclerosis Sister    Colon cancer Neg Hx    Colon polyps Neg Hx    Esophageal cancer Neg Hx    Rectal cancer Neg Hx    Stomach cancer Neg Hx     Past Surgical History:  Procedure Laterality Date   BIOPSY  07/19/2022   Procedure: BIOPSY;  Surgeon: Stacia Glendia BRAVO, MD;  Location: [facility];  Service: Gastroenterology;;   CHOLECYSTECTOMY  1988   COLONOSCOPY WITH PROPOFOL  N/A 07/19/2022   Procedure: COLONOSCOPY WITH PROPOFOL ;  Surgeon: Stacia Glendia BRAVO, MD;  Location: [facility];  Service: Gastroenterology;  Laterality: N/A;   HAND SURGERY  1997    POLYPECTOMY N/A 07/19/2022   Procedure: POLYPECTOMY;  Surgeon: Stacia Glendia BRAVO, MD;  Location: [facility];  Service: Gastroenterology;  Laterality: N/A;   Social History   Occupational History   Not on file  Tobacco Use   Smoking status: Former    Types: Cigarettes    Passive exposure: Current   Smokeless tobacco: Never   Tobacco comments:    Former occasional smoker started around age 47,   Vaping Use   Vaping status: Never Used  Substance and Sexual Activity   Alcohol use: Yes    Comment: occasionally   Drug use: Yes    Types: Marijuana    Comment: last used 01/17/24   Sexual activity: Yes    Partners: Male    Birth control/protection: None, Post-menopausal    Comment: menarche 58yo, sexual debut 58yo

## 2024-02-12 ENCOUNTER — Ambulatory Visit: Admitting: Obstetrics and Gynecology

## 2024-02-25 ENCOUNTER — Telehealth: Payer: Self-pay | Admitting: Orthopaedic Surgery

## 2024-02-25 NOTE — Telephone Encounter (Signed)
 Created note and sent to MyChart account. Left this information on her voicemail as well.

## 2024-02-25 NOTE — Telephone Encounter (Signed)
 Scheduled for shoulder scope on 03/25/2024. How long will she be out of work?

## 2024-02-25 NOTE — Telephone Encounter (Signed)
 12 weeks and can extend if needed

## 2024-02-25 NOTE — Telephone Encounter (Signed)
 Patient called and said she needs paperwork showing when she have surgery and how long she will be out for her job. [phone]

## 2024-03-09 ENCOUNTER — Telehealth: Payer: Self-pay | Admitting: Orthopaedic Surgery

## 2024-03-09 NOTE — Telephone Encounter (Signed)
 Pt dropped off her FMLA ([employer]) form to be completed by the provider and paid her $20.00 form fee (in cash).  Placed in the tray for Tammy.

## 2024-03-12 NOTE — Telephone Encounter (Signed)
 Completed and faxed.

## 2024-03-16 NOTE — Telephone Encounter (Signed)
 Spoke to patient and reminded her to have H pylori stool testing completed to check for eradication. She will come this week to pick up kit and return as she has surgery next week.

## 2024-03-18 ENCOUNTER — Other Ambulatory Visit: Payer: Self-pay

## 2024-03-18 ENCOUNTER — Encounter (HOSPITAL_BASED_OUTPATIENT_CLINIC_OR_DEPARTMENT_OTHER): Payer: Self-pay | Admitting: Orthopaedic Surgery

## 2024-03-19 ENCOUNTER — Other Ambulatory Visit: Payer: Self-pay | Admitting: Physician Assistant

## 2024-03-19 MED ORDER — HYDROCODONE-ACETAMINOPHEN 5-325 MG PO TABS: 1.0000 | ORAL_TABLET | Freq: Three times a day (TID) | ORAL | 0 refills | Status: AC | PRN

## 2024-03-19 MED ORDER — METHOCARBAMOL 500 MG PO TABS: 500.0000 mg | ORAL_TABLET | Freq: Two times a day (BID) | ORAL | 2 refills | Status: AC | PRN

## 2024-03-23 NOTE — Anesthesia Preprocedure Evaluation (Signed)
 Anesthesia Evaluation  Patient identified by MRN, date of birth, ID band Patient awake    Reviewed: Allergy & Precautions, NPO status , Patient's Chart, lab work & pertinent test results  History of Anesthesia Complications Negative for: history of anesthetic complications  Airway Mallampati: II  TM Distance: >3 FB Neck ROM: Full    Dental no notable dental hx. (+) Upper Dentures, Missing, Dental Advisory Given   Pulmonary Current SmokerPatient did not abstain from smoking.   Pulmonary exam normal breath sounds clear to auscultation       Cardiovascular hypertension, (-) angina (-) Past MI Normal cardiovascular exam Rhythm:Regular Rate:Normal     Neuro/Psych  PSYCHIATRIC DISORDERS         GI/Hepatic Neg liver ROS,,,  Endo/Other  negative endocrine ROS    Renal/GU negative Renal ROS     Musculoskeletal  (+) Arthritis ,    Abdominal  (+) + obese  Peds  Hematology   Anesthesia Other Findings All [facility]  Reproductive/Obstetrics                              Anesthesia Physical Anesthesia Plan  ASA: 3  Anesthesia Plan: General   Post-op Pain Management: Regional block*, Minimal or no pain anticipated, Ofirmev  IV (intra-op)* and Precedex    Induction: Intravenous  PONV Risk Score and Plan: Treatment may vary due to age or medical condition, Midazolam , Dexamethasone  and Ondansetron   Airway Management Planned: Oral ETT  Additional Equipment: None  Intra-op Plan:   Post-operative Plan: Extubation in OR  Informed Consent:      Dental advisory given  Plan Discussed with: CRNA and Surgeon  Anesthesia Plan Comments: (GA w R ISB)         Anesthesia Quick Evaluation

## 2024-03-24 NOTE — Progress Notes (Signed)

## 2024-03-25 ENCOUNTER — Encounter (HOSPITAL_BASED_OUTPATIENT_CLINIC_OR_DEPARTMENT_OTHER): Payer: Self-pay | Admitting: Orthopaedic Surgery

## 2024-03-25 ENCOUNTER — Encounter (HOSPITAL_BASED_OUTPATIENT_CLINIC_OR_DEPARTMENT_OTHER)
Admission: RE | Disposition: A | Discharge status: Home / Self Care | Payer: Self-pay | Source: Home / Self Care | Attending: Orthopaedic Surgery

## 2024-03-25 ENCOUNTER — Ambulatory Visit (HOSPITAL_BASED_OUTPATIENT_CLINIC_OR_DEPARTMENT_OTHER)
Admission: RE | Admit: 2024-03-25 | Discharge: 2024-03-25 | Disposition: A | Discharge status: Home / Self Care | Attending: Orthopaedic Surgery | Admitting: Orthopaedic Surgery

## 2024-03-25 ENCOUNTER — Other Ambulatory Visit: Payer: Self-pay

## 2024-03-25 ENCOUNTER — Ambulatory Visit (HOSPITAL_BASED_OUTPATIENT_CLINIC_OR_DEPARTMENT_OTHER): Payer: Self-pay | Admitting: Anesthesiology

## 2024-03-25 ENCOUNTER — Encounter: Payer: Self-pay | Admitting: Orthopaedic Surgery

## 2024-03-25 ENCOUNTER — Encounter (HOSPITAL_BASED_OUTPATIENT_CLINIC_OR_DEPARTMENT_OTHER): Payer: Self-pay | Admitting: Anesthesiology

## 2024-03-25 DIAGNOSIS — E669 Obesity, unspecified: Secondary | ICD-10-CM | POA: Insufficient documentation

## 2024-03-25 DIAGNOSIS — G8918 Other acute postprocedural pain: Secondary | ICD-10-CM | POA: Diagnosis not present

## 2024-03-25 DIAGNOSIS — M19011 Primary osteoarthritis, right shoulder: Secondary | ICD-10-CM | POA: Insufficient documentation

## 2024-03-25 DIAGNOSIS — M75121 Complete rotator cuff tear or rupture of right shoulder, not specified as traumatic: Secondary | ICD-10-CM | POA: Insufficient documentation

## 2024-03-25 DIAGNOSIS — S46011A Strain of muscle(s) and tendon(s) of the rotator cuff of right shoulder, initial encounter: Secondary | ICD-10-CM | POA: Diagnosis not present

## 2024-03-25 DIAGNOSIS — F17210 Nicotine dependence, cigarettes, uncomplicated: Secondary | ICD-10-CM | POA: Diagnosis not present

## 2024-03-25 DIAGNOSIS — Z6836 Body mass index (BMI) 36.0-36.9, adult: Secondary | ICD-10-CM | POA: Diagnosis not present

## 2024-03-25 DIAGNOSIS — I10 Essential (primary) hypertension: Secondary | ICD-10-CM | POA: Diagnosis not present

## 2024-03-25 DIAGNOSIS — M7541 Impingement syndrome of right shoulder: Secondary | ICD-10-CM | POA: Diagnosis not present

## 2024-03-25 DIAGNOSIS — Z79899 Other long term (current) drug therapy: Secondary | ICD-10-CM | POA: Insufficient documentation

## 2024-03-25 DIAGNOSIS — Z01818 Encounter for other preprocedural examination: Secondary | ICD-10-CM

## 2024-03-25 HISTORY — PX: SHOULDER ARTHROSCOPY WITH ROTATOR CUFF REPAIR: SHX5685

## 2024-03-25 HISTORY — PX: SUBACROMIAL DECOMPRESSION: SHX5174

## 2024-03-25 HISTORY — DX: Prediabetes: R73.03

## 2024-03-25 SURGERY — ARTHROSCOPY, SHOULDER, WITH ROTATOR CUFF REPAIR
Anesthesia: General | Site: Shoulder | Laterality: Right

## 2024-03-25 MED ORDER — ONDANSETRON HCL 4 MG/2ML IJ SOLN
INTRAMUSCULAR | Status: DC | PRN
  Administered 2024-03-25: 4 mg via INTRAVENOUS

## 2024-03-25 MED ORDER — SODIUM CHLORIDE (PF) 0.9 % IJ SOLN
INTRAMUSCULAR | Status: AC
  Filled 2024-03-25: qty 20

## 2024-03-25 MED ORDER — MIDAZOLAM HCL 2 MG/2ML IJ SOLN
2.0000 mg | Freq: Once | INTRAMUSCULAR | Status: AC
  Administered 2024-03-25: 2 mg via INTRAVENOUS

## 2024-03-25 MED ORDER — PROPOFOL 10 MG/ML IV BOLUS
INTRAVENOUS | Status: DC | PRN
  Administered 2024-03-25: 150 mg via INTRAVENOUS

## 2024-03-25 MED ORDER — ROCURONIUM BROMIDE 10 MG/ML (PF) SYRINGE
PREFILLED_SYRINGE | INTRAVENOUS | Status: DC | PRN
  Administered 2024-03-25: 60 mg via INTRAVENOUS

## 2024-03-25 MED ORDER — KETOROLAC TROMETHAMINE 30 MG/ML IJ SOLN
INTRAMUSCULAR | Status: AC
  Filled 2024-03-25: qty 2

## 2024-03-25 MED ORDER — OXYCODONE HCL 5 MG PO TABS: 5.0000 mg | ORAL_TABLET | Freq: Once | ORAL | Status: DC | PRN

## 2024-03-25 MED ORDER — GLYCOPYRROLATE PF 0.2 MG/ML IJ SOSY
PREFILLED_SYRINGE | INTRAMUSCULAR | Status: AC
  Filled 2024-03-25: qty 1

## 2024-03-25 MED ORDER — SUGAMMADEX SODIUM 200 MG/2ML IV SOLN
INTRAVENOUS | Status: DC | PRN
  Administered 2024-03-25: 200 mg via INTRAVENOUS

## 2024-03-25 MED ORDER — ROCURONIUM BROMIDE 10 MG/ML (PF) SYRINGE
PREFILLED_SYRINGE | INTRAVENOUS | Status: AC
  Filled 2024-03-25: qty 10

## 2024-03-25 MED ORDER — HYDROMORPHONE HCL 1 MG/ML IJ SOLN
0.2500 mg | INTRAMUSCULAR | Status: DC | PRN
  Administered 2024-03-25: 0.5 mg via INTRAVENOUS

## 2024-03-25 MED ORDER — FENTANYL CITRATE (PF) 100 MCG/2ML IJ SOLN
100.0000 ug | Freq: Once | INTRAMUSCULAR | Status: AC
  Administered 2024-03-25: 50 ug via INTRAVENOUS

## 2024-03-25 MED ORDER — LIDOCAINE 2% (20 MG/ML) 5 ML SYRINGE
INTRAMUSCULAR | Status: DC | PRN
  Administered 2024-03-25: 100 mg via INTRAVENOUS

## 2024-03-25 MED ORDER — ONDANSETRON HCL 4 MG/2ML IJ SOLN
INTRAMUSCULAR | Status: AC
  Filled 2024-03-25: qty 2

## 2024-03-25 MED ORDER — LACTATED RINGERS IV SOLN: INTRAVENOUS | Status: DC

## 2024-03-25 MED ORDER — SODIUM CHLORIDE 0.9 % IR SOLN
Status: DC | PRN
  Administered 2024-03-25: 9000 mL

## 2024-03-25 MED ORDER — FENTANYL CITRATE (PF) 100 MCG/2ML IJ SOLN
INTRAMUSCULAR | Status: AC
  Filled 2024-03-25: qty 2

## 2024-03-25 MED ORDER — DEXMEDETOMIDINE HCL IN NACL 80 MCG/20ML IV SOLN
INTRAVENOUS | Status: DC | PRN
  Administered 2024-03-25: 8 ug via INTRAVENOUS

## 2024-03-25 MED ORDER — ATROPINE SULFATE 0.4 MG/ML IV SOLN
INTRAVENOUS | Status: AC
  Filled 2024-03-25: qty 1

## 2024-03-25 MED ORDER — PHENYLEPHRINE 80 MCG/ML (10ML) SYRINGE FOR IV PUSH (FOR BLOOD PRESSURE SUPPORT)
PREFILLED_SYRINGE | INTRAVENOUS | Status: AC
  Filled 2024-03-25: qty 10

## 2024-03-25 MED ORDER — CEFAZOLIN SODIUM-DEXTROSE 2-4 GM/100ML-% IV SOLN
2.0000 g | INTRAVENOUS | Status: AC
  Administered 2024-03-25: 2 g via INTRAVENOUS

## 2024-03-25 MED ORDER — LIDOCAINE 2% (20 MG/ML) 5 ML SYRINGE
INTRAMUSCULAR | Status: AC
  Filled 2024-03-25: qty 5

## 2024-03-25 MED ORDER — HYDRALAZINE HCL 20 MG/ML IJ SOLN
INTRAMUSCULAR | Status: AC
  Filled 2024-03-25: qty 1

## 2024-03-25 MED ORDER — HYDROMORPHONE HCL 1 MG/ML IJ SOLN
INTRAMUSCULAR | Status: AC
Start: 2024-03-25 — End: 2024-03-25
  Filled 2024-03-25: qty 0.5

## 2024-03-25 MED ORDER — MIDAZOLAM HCL 2 MG/2ML IJ SOLN
INTRAMUSCULAR | Status: AC
  Filled 2024-03-25: qty 2

## 2024-03-25 MED ORDER — CEFAZOLIN SODIUM-DEXTROSE 2-4 GM/100ML-% IV SOLN
INTRAVENOUS | Status: AC
Start: 2024-03-25 — End: 2024-03-25
  Filled 2024-03-25: qty 100

## 2024-03-25 MED ORDER — EPHEDRINE SULFATE-NACL 50-0.9 MG/10ML-% IV SOSY
PREFILLED_SYRINGE | INTRAVENOUS | Status: DC | PRN
  Administered 2024-03-25 (×2): 10 mg via INTRAVENOUS
  Administered 2024-03-25: 5 mg via INTRAVENOUS

## 2024-03-25 MED ORDER — DEXAMETHASONE SODIUM PHOSPHATE 10 MG/ML IJ SOLN
INTRAMUSCULAR | Status: AC
  Filled 2024-03-25: qty 1

## 2024-03-25 MED ORDER — CLONIDINE HCL (ANALGESIA) 100 MCG/ML EP SOLN
EPIDURAL | Status: DC | PRN
Start: 2024-03-25 — End: 2024-03-25
  Administered 2024-03-25: 100 ug

## 2024-03-25 MED ORDER — LABETALOL HCL 5 MG/ML IV SOLN
INTRAVENOUS | Status: AC
  Filled 2024-03-25: qty 4

## 2024-03-25 MED ORDER — CEFAZOLIN SODIUM 1 G IJ SOLR
INTRAMUSCULAR | Status: AC
  Filled 2024-03-25: qty 40

## 2024-03-25 MED ORDER — DEXAMETHASONE SODIUM PHOSPHATE 10 MG/ML IJ SOLN
INTRAMUSCULAR | Status: DC | PRN
  Administered 2024-03-25: 4 mg via INTRAVENOUS

## 2024-03-25 MED ORDER — ROPIVACAINE HCL 5 MG/ML IJ SOLN
INTRAMUSCULAR | Status: DC | PRN
Start: 2024-03-25 — End: 2024-03-25
  Administered 2024-03-25: 30 mL via PERINEURAL

## 2024-03-25 MED ORDER — PHENYLEPHRINE 80 MCG/ML (10ML) SYRINGE FOR IV PUSH (FOR BLOOD PRESSURE SUPPORT)
PREFILLED_SYRINGE | INTRAVENOUS | Status: DC | PRN
  Administered 2024-03-25 (×4): 80 ug via INTRAVENOUS

## 2024-03-25 MED ORDER — OXYCODONE HCL 5 MG/5ML PO SOLN: 5.0000 mg | Freq: Once | ORAL | Status: DC | PRN

## 2024-03-25 MED ORDER — KETOROLAC TROMETHAMINE 30 MG/ML IJ SOLN: 30.0000 mg | Freq: Once | INTRAMUSCULAR | Status: DC | PRN

## 2024-03-25 MED ORDER — EPHEDRINE 5 MG/ML INJ
INTRAVENOUS | Status: AC
  Filled 2024-03-25: qty 5

## 2024-03-25 SURGICAL SUPPLY — 62 items
ANCHOR SUT FBRTK 2.6 SP1.7 TAP (Anchor) IMPLANT
ANCHOR SWIVELOCK SP KL 4.75 (Anchor) IMPLANT
BENZOIN TINCTURE PRP APPL 2/3 (GAUZE/BANDAGES/DRESSINGS) IMPLANT
BLADE EXCALIBUR 4.0X13 (MISCELLANEOUS) IMPLANT
BLADE SURG 15 STRL LF DISP TIS (BLADE) IMPLANT
BNDG COHESIVE 4X5 TAN STRL LF (GAUZE/BANDAGES/DRESSINGS) ×2 IMPLANT
BURR OVAL 8 FLU 4.0X13 (MISCELLANEOUS) ×2 IMPLANT
CANNULA 5.75X71 LONG (CANNULA) ×2 IMPLANT
CANNULA SHOULDER 7CM (CANNULA) ×2 IMPLANT
CANNULA TWIST IN 8.25X7CM (CANNULA) IMPLANT
COOLER ICEMAN CLASSIC (MISCELLANEOUS) ×2 IMPLANT
DERMABOND ADVANCED .7 DNX12 (GAUZE/BANDAGES/DRESSINGS) IMPLANT
DISSECTOR 3.8MM X 13CM (MISCELLANEOUS) IMPLANT
DRAPE IMP U-DRAPE 54X76 (DRAPES) ×2 IMPLANT
DRAPE INCISE IOBAN 66X45 STRL (DRAPES) ×2 IMPLANT
DRAPE POUCH INSTRU U-SHP 10X18 (DRAPES) ×2 IMPLANT
DRAPE STERI 35X30 U-POUCH (DRAPES) ×2 IMPLANT
DRAPE SURG 17X23 STRL (DRAPES) ×2 IMPLANT
DRAPE TOP ARMCOVERS (MISCELLANEOUS) IMPLANT
DRAPE U-SHAPE 47X51 STRL (DRAPES) ×4 IMPLANT
DRAPE U-SHAPE 76X120 STRL (DRAPES) ×4 IMPLANT
DURAPREP 26ML APPLICATOR (WOUND CARE) ×2 IMPLANT
ELECTRODE REM PT RTRN 9FT ADLT (ELECTROSURGICAL) ×2 IMPLANT
FIBER TAPE 2MM (SUTURE) IMPLANT
GAUZE PAD ABD 8X10 STRL (GAUZE/BANDAGES/DRESSINGS) ×2 IMPLANT
GAUZE SPONGE 4X4 12PLY STRL (GAUZE/BANDAGES/DRESSINGS) ×2 IMPLANT
GAUZE XEROFORM 1X8 LF (GAUZE/BANDAGES/DRESSINGS) ×2 IMPLANT
GLOVE BIOGEL PI IND STRL 7.0 (GLOVE) IMPLANT
GLOVE BIOGEL PI IND STRL 7.5 (GLOVE) ×2 IMPLANT
GLOVE ECLIPSE 7.0 STRL STRAW (GLOVE) ×2 IMPLANT
GLOVE INDICATOR 7.0 STRL GRN (GLOVE) ×2 IMPLANT
GLOVE SURG SS PI 7.0 STRL IVOR (GLOVE) IMPLANT
GLOVE SURG SS PI 7.5 STRL IVOR (GLOVE) IMPLANT
GLOVE SURG SYN 7.5 PF PI (GLOVE) ×4 IMPLANT
GOWN STRL REUS W/ TWL LRG LVL3 (GOWN DISPOSABLE) ×2 IMPLANT
GOWN STRL REUS W/ TWL XL LVL3 (GOWN DISPOSABLE) ×2 IMPLANT
GOWN STRL SURGICAL XL XLNG (GOWN DISPOSABLE) ×2 IMPLANT
MANIFOLD NEPTUNE II (INSTRUMENTS) ×2 IMPLANT
NDL HD SCORPION MEGA LOADER (NEEDLE) IMPLANT
PACK ARTHROSCOPY DSU (CUSTOM PROCEDURE TRAY) ×2 IMPLANT
PACK BASIN DAY SURGERY FS (CUSTOM PROCEDURE TRAY) ×2 IMPLANT
PAD COLD SHLDR WRAP-ON (PAD) ×2 IMPLANT
SHEET MEDIUM DRAPE 40X70 STRL (DRAPES) ×2 IMPLANT
SLEEVE SCD COMPRESS KNEE MED (STOCKING) ×2 IMPLANT
SLING ARM FOAM STRAP LRG (SOFTGOODS) IMPLANT
SLING ULTRA II L (ORTHOPEDIC SUPPLIES) IMPLANT
SPIKE FLUID TRANSFER (MISCELLANEOUS) IMPLANT
SPONGE T-LAP 4X18 ~~LOC~~+RFID (SPONGE) IMPLANT
STRIP CLOSURE SKIN 1/2X4 (GAUZE/BANDAGES/DRESSINGS) IMPLANT
SUPPORT WRAP ARM LG (MISCELLANEOUS) IMPLANT
SUT ETHILON 3 0 PS 1 (SUTURE) ×2 IMPLANT
SUT MNCRL AB 4-0 PS2 18 (SUTURE) ×2 IMPLANT
SUT PDS AB 1 CT 36 (SUTURE) IMPLANT
SUT TIGER TAPE 7 IN WHITE (SUTURE) IMPLANT
SUT VIC AB 2-0 CT1 TAPERPNT 27 (SUTURE) ×2 IMPLANT
SUTURE FIBERWR #2 38 T-5 BLUE (SUTURE) IMPLANT
SUTURE TAPE 1.3 40 TPR END (SUTURE) IMPLANT
SUTURE TAPE TIGERLINK 1.3MM BL (SUTURE) IMPLANT
TOWEL GREEN STERILE FF (TOWEL DISPOSABLE) ×2 IMPLANT
TUBE CONNECTING 20X1/4 (TUBING) IMPLANT
TUBING ARTHROSCOPY IRRIG 16FT (MISCELLANEOUS) IMPLANT
WAND ABLATOR APOLLO I90 (BUR) ×2 IMPLANT

## 2024-03-25 NOTE — Transfer of Care (Signed)
 Immediate Anesthesia Transfer of Care Note  Patient: Christina Cannon  Procedure(s) Performed: RIGHT SHOULDER ARTHROSCOPY, ROTATOR CUFF REPAIR (Right: Shoulder) SUBACROMIAL DECOMPRESSION, EXTENSIVE DEBRIDEMENT (Right: Shoulder)  Patient Location: PACU  Anesthesia Type:GA combined with regional for post-op pain  Level of Consciousness: drowsy and patient cooperative  Airway & Oxygen Therapy: Patient Spontanous Breathing and Patient connected to face mask oxygen  Post-op Assessment: Report given to RN and Post -op Vital signs reviewed and stable  Post vital signs: Reviewed and stable  Last Vitals:  Vitals Value Taken Time  BP 137/85 03/25/24 13:58  Temp 36.2 C 03/25/24 13:58  Pulse 81 03/25/24 13:59  Resp 16 03/25/24 13:59  SpO2 100 % 03/25/24 13:59  Vitals shown include unfiled device data.  Last Pain:  Vitals:   03/25/24 1134  TempSrc:   PainSc: 0-No pain      Patients Stated Pain Goal: 4 (03/25/24 1005)  Complications: No notable events documented.

## 2024-03-25 NOTE — Anesthesia Procedure Notes (Signed)
 Procedure Name: Intubation Date/Time: 03/25/2024 12:15 PM  Performed by: Casimir Lucie HERO, CRNAPre-anesthesia Checklist: Patient identified, Emergency Drugs available, Suction available and Patient being monitored Patient Re-evaluated:Patient Re-evaluated prior to induction Oxygen Delivery Method: Circle system utilized Preoxygenation: Pre-oxygenation with 100% oxygen Induction Type: IV induction Ventilation: Mask ventilation without difficulty Laryngoscope Size: Miller and 2 Grade View: Grade I Tube type: Oral Tube size: 7.0 mm Number of attempts: 1 Airway Equipment and Method: Stylet Placement Confirmation: ETT inserted through vocal cords under direct vision, positive ETCO2 and breath sounds checked- equal and bilateral Secured at: 21 cm Tube secured with: Tape Dental Injury: Teeth and Oropharynx as per pre-operative assessment

## 2024-03-25 NOTE — Discharge Instructions (Addendum)
 Post-operative patient instructions  Shoulder Arthroscopy   Ice:  Place intermittent ice or cooler pack over your shoulder, 30 minutes on and 30 minutes off.  Continue this for the first 72 hours after surgery, then save ice for use after therapy sessions or on more active days.   Weight:  You may NOT bear weight on your arm. Motion:  Do not perform motion of the shoulder. Shoulder sling: You must wear the shoulder sling at all times including while sleeping until otherwise instructed by your surgeon. Dressing:  Perform 1st dressing change at 2 days postoperative. A moderate amount of blood tinged drainage is to be expected.  So if you bleed through the dressing on the first or second day or if you have fevers, it is fine to change the dressing/check the wounds early and redress wound.  If it bleeds through again, or if the incisions are leaking frank blood, please call the office. May change dressing every 1-2 days thereafter to help watch wounds. You may place regular band-aids over the incisions.   Shower:  Light shower is ok after 2 days.  Please take shower, NO bath. Recover with gauze and ace wrap to help keep wounds protected.   Pain medication:  A narcotic pain medication has been prescribed.  Take as directed.  Typically you need narcotic pain medication more regularly during the first 3 to 5 days after surgery.  Decrease your use of the medication as the pain improves.  Narcotics can sometimes cause constipation, even after a few doses.  If you have problems with constipation, you can take an over the counter stool softener or light laxative.  If you have persistent problems, please notify your physician's office. Physical therapy: Additional activity guidelines to be provided by your physician or physical therapist at follow-up visits.  Driving: Do not recommend driving x 1 week post surgical, especially if surgery performed on right side. Should not drive while taking narcotic pain  medications. It typically takes at least 1 week to restore sufficient neuromuscular function for normal reaction times for driving safety.  Call [phone] for questions or problems. Evenings you will be forwarded to the hospital operator.  Ask for the orthopaedic physician on call. Please call if you experience:    Redness, foul smelling, or persistent drainage from the surgical site  worsening shoulder pain and swelling not responsive to medication  any calf pain and or swelling of the lower leg  temperatures greater than 101.5 F other questions or concerns  Per [facility] clinic policy, our goal is ensure optimal postoperative pain control with a multimodal pain management strategy. For all [facility] patients, our goal is to wean post-operative narcotic medications by 6 weeks post-operatively. If this is not possible due to utilization of pain medication prior to surgery, your [facility] doctor will support your acute post-operative pain control for the first 6 weeks postoperatively, with a plan to transition you back to your primary pain team following that. Maralee will work to ensure a Therapist, occupational.  Thank you for allowing us  to be a part of your care.    Post Anesthesia Home Care Instructions  Activity: Get plenty of rest for the remainder of the day. A responsible individual must stay with you for 24 hours following the procedure.  For the next 24 hours, DO NOT: -Drive a car -Advertising copywriter -Drink alcoholic beverages -Take any medication unless instructed by your physician -Make any legal decisions or sign important papers.  Meals: Start  with liquid foods such as gelatin or soup. Progress to regular foods as tolerated. Avoid greasy, spicy, heavy foods. If nausea and/or vomiting occur, drink only clear liquids until the nausea and/or vomiting subsides. Call your physician if vomiting continues.  Special Instructions/Symptoms: Your throat may feel dry or sore from the  anesthesia or the breathing tube placed in your throat during surgery. If this causes discomfort, gargle with warm salt water. The discomfort should disappear within 24 hours.  If you had a scopolamine patch placed behind your ear for the management of post- operative nausea and/or vomiting:  1. The medication in the patch is effective for 72 hours, after which it should be removed.  Wrap patch in a tissue and discard in the trash. Wash hands thoroughly with soap and water. 2. You may remove the patch earlier than 72 hours if you experience unpleasant side effects which may include dry mouth, dizziness or visual disturbances. 3. Avoid touching the patch. Wash your hands with soap and water after contact with the patch.    Regional Anesthesia Blocks  1. You may not be able to move or feel the blocked extremity after a regional anesthetic block. This may last may last from 3-48 hours after placement, but it will go away. The length of time depends on the medication injected and your individual response to the medication. As the nerves start to wake up, you may experience tingling as the movement and feeling returns to your extremity. If the numbness and inability to move your extremity has not gone away after 48 hours, please call your surgeon.   2. The extremity that is blocked will need to be protected until the numbness is gone and the strength has returned. Because you cannot feel it, you will need to take extra care to avoid injury. Because it may be weak, you may have difficulty moving it or using it. You may not know what position it is in without looking at it while the block is in effect.  3. For blocks in the legs and feet, returning to weight bearing and walking needs to be done carefully. You will need to wait until the numbness is entirely gone and the strength has returned. You should be able to move your leg and foot normally before you try and bear weight or walk. You will need someone  to be with you when you first try to ensure you do not fall and possibly risk injury.  4. Bruising and tenderness at the needle site are common side effects and will resolve in a few days.  5. Persistent numbness or new problems with movement should be communicated to the surgeon or the [facility] [phone] [facility] [phone]).   \

## 2024-03-25 NOTE — Anesthesia Procedure Notes (Signed)
 Anesthesia Regional Block: Interscalene brachial plexus block   Pre-Anesthetic Checklist: , timeout performed,  Correct Patient, Correct Site, Correct Laterality,  Correct Procedure, Correct Position, site marked,  Risks and benefits discussed,  Surgical consent,  Pre-op evaluation,  At surgeon's request and post-op pain management  Laterality: Upper and Right  Prep: Maximum Sterile Barrier Precautions used, chloraprep       Needles:  Injection technique: Single-shot  Needle Type: Echogenic Needle     Needle Length: 5cm  Needle Gauge: 21     Additional Needles:   Procedures:,,,, ultrasound used (permanent image in chart),,    Narrative:  Start time: 03/25/2024 11:31 AM End time: 03/25/2024 11:36 AM Injection made incrementally with aspirations every 5 mL.  Performed by: Personally  Anesthesiologist: Jefm Garnette LABOR, MD  Additional Notes: Block assessed prior to procedure. Patient tolerated procedure well.

## 2024-03-25 NOTE — H&P (Signed)
 PREOPERATIVE H&P  Chief Complaint: right shoulder rotator cuff tear  HPI: Christina Cannon is a 58 y.o. female who presents for surgical treatment of right shoulder rotator cuff tear.  She denies any changes in medical history.  Past Surgical History:  Procedure Laterality Date   BIOPSY  07/19/2022   Procedure: BIOPSY;  Surgeon: Stacia Glendia BRAVO, MD;  Location: [facility];  Service: Gastroenterology;;   CHOLECYSTECTOMY  1988   COLONOSCOPY WITH PROPOFOL  N/A 07/19/2022   Procedure: COLONOSCOPY WITH PROPOFOL ;  Surgeon: Stacia Glendia BRAVO, MD;  Location: [facility];  Service: Gastroenterology;  Laterality: N/A;   HAND SURGERY  1997   POLYPECTOMY N/A 07/19/2022   Procedure: POLYPECTOMY;  Surgeon: Stacia Glendia BRAVO, MD;  Location: [facility];  Service: Gastroenterology;  Laterality: N/A;   Social History   Socioeconomic History   Marital status: Widowed    Spouse name: Not on file   Number of children: Not on file   Years of education: Not on file   Highest education level: Some college, no degree  Occupational History   Not on file  Tobacco Use   Smoking status: Some Days    Types: Cigarettes    Passive exposure: Current   Smokeless tobacco: Never   Tobacco comments:    Former occasional smoker started around age 36,   Vaping Use   Vaping status: Never Used  Substance and Sexual Activity   Alcohol use: Not Currently   Drug use: Yes    Types: Marijuana    Comment: last used 03-17-24   Sexual activity: Yes    Partners: Male    Birth control/protection: None, Post-menopausal    Comment: menarche 58yo, sexual debut 58yo  Other Topics Concern   Not on file  Social History Narrative   Not on file   Social Drivers of Health   Financial Resource Strain: Low Risk  (11/14/2023)   Overall Financial Resource Strain (CARDIA)    Difficulty of Paying Living Expenses: Not hard at all  Food Insecurity: No Food Insecurity (11/14/2023)   Hunger Vital Sign     Worried About Running Out of Food in the Last Year: Never true    Ran Out of Food in the Last Year: Never true  Transportation Needs: No Transportation Needs (11/14/2023)   PRAPARE - Administrator, Civil Service (Medical): No    Lack of Transportation (Non-Medical): No  Recent Concern: Transportation Needs - Unmet Transportation Needs (11/12/2023)   PRAPARE - Transportation    Lack of Transportation (Medical): Yes    Lack of Transportation (Non-Medical): Yes  Physical Activity: Insufficiently Active (11/14/2023)   Exercise Vital Sign    Days of Exercise per Week: 4 days    Minutes of Exercise per Session: 30 min  Stress: No Stress Concern Present (11/14/2023)   [employer] of Occupational Health - Occupational Stress Questionnaire    Feeling of Stress : Only a little  Recent Concern: Stress - Stress Concern Present (11/12/2023)   [employer] of Occupational Health - Occupational Stress Questionnaire    Feeling of Stress : To some extent  Social Connections: Moderately Integrated (11/14/2023)   Social Connection and Isolation Panel    Frequency of Communication with Friends and Family: More than three times a week    Frequency of Social Gatherings with Friends and Family: More than three times a week    Attends Religious Services: More than 4 times per year    Active Member of Clubs or  Organizations: Yes    Attends Banker Meetings: 1 to 4 times per year    Marital Status: Widowed  Recent Concern: Social Connections - Moderately Isolated (11/12/2023)   Social Connection and Isolation Panel    Frequency of Communication with Friends and Family: More than three times a week    Frequency of Social Gatherings with Friends and Family: Twice a week    Attends Religious Services: More than 4 times per year    Active Member of [employer] or Organizations: No    Attends Banker Meetings: Not on file    Marital Status: Widowed   Family History  Problem  Relation Age of Onset   Diabetes Mother    Hypertension Mother    Diabetes Father    Hypertension Father    Heart failure Father    Multiple sclerosis Sister    Colon cancer Neg Hx    Colon polyps Neg Hx    Esophageal cancer Neg Hx    Rectal cancer Neg Hx    Stomach cancer Neg Hx    Allergies  Allergen Reactions   Ceftin [Cefuroxime Axetil] Anaphylaxis and Swelling    Throat and face swell. Pt stated she had a regimen in the hospital recently(2018) and did just fine.   Prior to Admission medications   Medication Sig Start Date End Date Taking? Authorizing Provider  acetaminophen -codeine  (TYLENOL  #3) 300-30 MG tablet Take 1-2 tablets by mouth at bedtime as needed for pain. 01/16/24  Yes Vicci Barnie NOVAK, MD  amLODipine  (NORVASC ) 10 MG tablet Take 1 tablet (10 mg total) by mouth daily. 01/16/24  Yes Vicci Barnie NOVAK, MD  HYDROcodone -acetaminophen  (NORCO/VICODIN) 5-325 MG tablet Take 1 tablet by mouth 3 (three) times daily as needed. To be taken after surgery 03/19/24   Jule Ronal CROME, PA-C  ibuprofen  (ADVIL ) 800 MG tablet Take 1 tablet (800 mg total) by mouth 2 (two) times daily as needed. Take with food 01/16/24  Yes Vicci Barnie NOVAK, MD  methocarbamol  (ROBAXIN ) 500 MG tablet Take 1 tablet (500 mg total) by mouth 2 (two) times daily as needed. 03/19/24   Jule Ronal CROME, PA-C  traMADol  (ULTRAM ) 50 MG tablet Take 1-2 tablets (50-100 mg total) by mouth daily as needed. 02/11/24  Yes Jerri Kay HERO, MD  Vitamin D , Ergocalciferol , (DRISDOL ) 1.25 MG (50000 UNIT) CAPS capsule Take 1 capsule (50,000 Units total) by mouth every 7 (seven) days. 01/16/24  Yes Vicci Barnie NOVAK, MD     Positive ROS: All other systems have been reviewed and were otherwise negative with the exception of those mentioned in the HPI and as above.  Physical Exam: General: Alert, no acute distress Cardiovascular: No pedal edema Respiratory: No cyanosis, no use of accessory musculature GI: abdomen soft Skin: No  lesions in the area of chief complaint Neurologic: Sensation intact distally Psychiatric: Patient is competent for consent with normal mood and affect Lymphatic: no lymphedema  MUSCULOSKELETAL: exam stable  Assessment: right shoulder rotator cuff tear  Plan: Plan for Procedure(s): ARTHROSCOPY, SHOULDER, WITH ROTATOR CUFF REPAIR DECOMPRESSION, SUBACROMIAL SPACE  The risks benefits and alternatives were discussed with the patient including but not limited to the risks of nonoperative treatment, versus surgical intervention including infection, bleeding, nerve injury,  blood clots, cardiopulmonary complications, morbidity, mortality, among others, and they were willing to proceed.   Ozell Jerri, MD 03/25/2024 9:47 AM

## 2024-03-25 NOTE — Anesthesia Postprocedure Evaluation (Signed)
 Anesthesia Post Note  Patient: Christina Cannon  Procedure(s) Performed: RIGHT SHOULDER ARTHROSCOPY, ROTATOR CUFF REPAIR (Right: Shoulder) SUBACROMIAL DECOMPRESSION, EXTENSIVE DEBRIDEMENT (Right: Shoulder)     Patient location during evaluation: PACU Anesthesia Type: General and Regional Level of consciousness: awake and alert Pain management: pain level controlled Vital Signs Assessment: post-procedure vital signs reviewed and stable Respiratory status: spontaneous breathing, nonlabored ventilation, respiratory function stable and patient connected to nasal cannula oxygen Cardiovascular status: blood pressure returned to baseline and stable Postop Assessment: no apparent nausea or vomiting Anesthetic complications: no   No notable events documented.  Last Vitals:  Vitals:   03/25/24 1415 03/25/24 1500  BP: 136/81 (!) 142/85  Pulse: 80 74  Resp: 13 20  Temp:  (!) 36.2 C  SpO2: 95% 94%    Last Pain:  Vitals:   03/25/24 1500  TempSrc: Temporal  PainSc:                  Garnette DELENA Gab

## 2024-03-25 NOTE — Progress Notes (Signed)
Assisted Dr. Valma Cava with right, interscalene , ultrasound guided block. Side rails up, monitors on throughout procedure. See vital signs in flow sheet. Tolerated Procedure well.

## 2024-03-25 NOTE — Op Note (Signed)
 Date of Surgery: 03/25/2024  INDICATIONS: The patient is a 57 year old female with right shoulder pain that has failed conservative treatment;  The patient did consent to the procedure after discussion of the risks and benefits.  DIAGNOSES: Right shoulder, acute traumatic rotator cuff tear, AC arthritis, subacromial impingement, and  .  POST-OPERATIVE DIAGNOSIS: same and Buford complex  PROCEDURE: Arthroscopic extensive debridement - 29823 Subdeltoid Bursa, Supraspinatus Tendon, Anterior Labrum, Superior Labrum, Posterior Labrum, and subscapularis tendon Arthroscopic subacromial decompression - 70173 Arthroscopic rotator cuff repair - 70172   OPERATIVE FINDING: Exam under anesthesia: Normal Articular space: Normal Chondral surfaces: Normal Biceps: Normal Subscapularis: Mild tendinopathy Supraspinatus: Complete tear  Infraspinatus: Intact   SURGEON: N. Ozell Cummins, M.D.  ASSIST: Ronal Morna Grave, PA-C  ANESTHESIA:  general, regional  IV FLUIDS AND URINE: See anesthesia.  ESTIMATED BLOOD LOSS: minimal mL.  IMPLANTS:  Implant Name Type Inv. Item Serial No. Manufacturer Lot No. LRB No. Used Action  ANCHOR SUT FBRTK 2.6 SP1.7 TAP - [id number] Anchor ANCHOR SUT FBRTK 2.6 SP1.7 TAP  [employer] [id number] Right 1 Implanted  [facility] SWIVELOCK SP KL 4.75 - [id number] Anchor ANCHOR SWIVELOCK SP KL 4.75  [employer] [id number] Right 1 Implanted    COMPLICATIONS: None.  DESCRIPTION OF PROCEDURE: The patient was brought to the operating room and placed supine on the operating table.  The patient had been signed prior to the procedure and this was documented. The patient had the anesthesia placed by the anesthesiologist.  A time-out was performed to confirm that this was the correct patient, site, side and location. The patient did receive antibiotics prior to the incision and was re-dosed during the procedure as needed at indicated intervals.  The patient was then positioned into the  beach chair position with all bony prominences well padded and relaxed neck. The patient had the operative extremity prepped and draped in the standard surgical fashion.  Bony landmarks were palpated and marked on the skin.  A posterior shoulder arthroscopy portal was created followed by an anterior portal through the rotator interval using spinal needle localization.  Diagnostic arthroscopy ensued with following findings. Chondral surfaces were unremarkable.  Buford complex noted.  Mild tendinopathy of subscapularis tendon and biceps tendon.  Mild degenerative fraying of the superior and posterior labrum.  Full thickness anterior supraspinatus tear.  All of the above-mentioned structures were debrided with a motorized shaver.  Arthroscope was then repositioned into the subacromial space.  Additional anterior lateral and posterior lateral portals were created.  There is severe subdeltoid and subacromial bursitis.  This was removed with a ArthroCare wand and motorized shaver.  CA ligament was partially released from the acromion.  Unfavorable acromial anatomy was encountered predisposing to impingement.  Acromioplasty was performed anteriorly and laterally.  Mild AC arthritis was seen.  The arm was then abducted and externally rotated to bring the tear into the visual field.  The distal edge of the supraspinatus was prepared.  The underlying bone was prepared with a bur down to bleeding bone.  Soft tissue adhesions were resected to improve visualization and to aid in repair.  Infraspinatus tendon was unremarkable.  A double row repair was performed with the all suture anchor placed at the articular margin.  The 4 strands were anchored to a 4.75 mm swivel lock in the lateral proximal humerus.  I was very pleased with the reduction of the rotator cuff back to the footprint. Excess fluid was expressed from the shoulder.  Incisions closed with  nylon.  Sterile dressings were applied.  Shoulder sling and abduction pillow  was placed.  Patient tolerated the procedure well had no any complications.  Morna Grave, my PA, was a medical necessity for the entirety of the surgery including opening, closing, limb positioning, retracting, exposing, and repairing.  POSTOPERATIVE PLAN: Patient will be discharged home.  Follow-up in 1 week for suture removal and initiation of physical therapy.  GEANNIE Ozell Cummins, MD [facility] 1:37 PM

## 2024-03-26 ENCOUNTER — Encounter (HOSPITAL_BASED_OUTPATIENT_CLINIC_OR_DEPARTMENT_OTHER): Payer: Self-pay | Admitting: Orthopaedic Surgery

## 2024-04-01 ENCOUNTER — Ambulatory Visit (INDEPENDENT_AMBULATORY_CARE_PROVIDER_SITE_OTHER): Admitting: Physician Assistant

## 2024-04-01 DIAGNOSIS — Z9889 Other specified postprocedural states: Secondary | ICD-10-CM

## 2024-04-01 MED ORDER — DICLOFENAC SODIUM 75 MG PO TBEC: 75.0000 mg | DELAYED_RELEASE_TABLET | Freq: Two times a day (BID) | ORAL | 2 refills | Status: AC | PRN

## 2024-04-01 MED ORDER — HYDROCODONE-ACETAMINOPHEN 7.5-325 MG PO TABS: 1.0000 | ORAL_TABLET | Freq: Four times a day (QID) | ORAL | 0 refills | Status: AC | PRN

## 2024-04-01 MED ORDER — METHOCARBAMOL 750 MG PO TABS: 750.0000 mg | ORAL_TABLET | Freq: Three times a day (TID) | ORAL | 2 refills | Status: AC | PRN

## 2024-04-01 NOTE — Progress Notes (Signed)
 Post-Op Visit Note   Patient: Christina Cannon           Date of Birth: May 09, 1966           MRN: [id number] Visit Date: 04/01/2024 PCP: Vicci Barnie NOVAK, MD   Assessment & Plan:  Chief Complaint:  Chief Complaint  Patient presents with   Right Shoulder - Pain   Visit Diagnoses:  1. S/P rotator cuff repair   2. S/P arthroscopy of right shoulder     Plan: Patient is a pleasant 58 year old female comes in today 1 week status post right shoulder arthroscopic debridement, decompression rotator cuff repair 03/25/2024.  She has been in a fair amount of pain and has been taking Norco and Robaxin .  She has been compliant wearing her sling.  Examination right shoulder reveals will healed surgical portals with nylon sutures in place.  No evidence of infection or cellulitis.  Fingers warm well-perfused.  She is neurovascularly intact distally.  Today, her sutures were removed and Steri-Strips applied.  She will continue wearing her sling for another 5 weeks.  I sent in a referral for outpatient physical therapy.  She will let me know if she has not heard anything within the next week.  I refilled her Norco and Robaxin  and have added diclofenac .  Follow-up in 5 weeks for recheck.  Call with concerns or questions.  Follow-Up Instructions: Return in about 5 weeks (around 05/06/2024).   Orders:  Orders Placed This Encounter  Procedures   Ambulatory referral to Physical Therapy   Meds ordered this encounter  Medications   HYDROcodone -acetaminophen  (NORCO) 7.5-325 MG tablet    Sig: Take 1 tablet by mouth every 6 (six) hours as needed for moderate pain (pain score 4-6).    Dispense:  30 tablet    Refill:  0   methocarbamol  (ROBAXIN ) 750 MG tablet    Sig: Take 1 tablet (750 mg total) by mouth 3 (three) times daily as needed.    Dispense:  30 tablet    Refill:  2   diclofenac  (VOLTAREN ) 75 MG EC tablet    Sig: Take 1 tablet (75 mg total) by mouth 2 (two) times daily as needed.     Dispense:  60 tablet    Refill:  2    Imaging: No new imaging  PMFS History: Patient Active Problem List   Diagnosis Date Noted   Arthritis of right acromioclavicular joint 03/25/2024   Impingement syndrome of right shoulder 03/25/2024   Traumatic complete tear of right rotator cuff 02/11/2024   Vitamin D  deficiency, unspecified 05/28/2023   Prediabetes 05/28/2023   Polyp of colon 07/19/2022   Prolonged QT interval 12/21/2021   Colitis 12/21/2021   Diverticulitis 12/20/2021   Hypokalemia 12/20/2021   Obesity (BMI 30-39.9) 12/20/2021   Tobacco use 12/20/2021   History of colonic diverticulitis 07/09/2017   Primary osteoarthritis of both knees 07/09/2017   Primary osteoarthritis of both ankles 07/09/2017   Tobacco dependence 07/09/2017   Elevated blood pressure reading 07/09/2017   Leukocytosis 06/10/2017   Skin tag of vulva 08/10/2015   Past Medical History:  Diagnosis Date   Diverticulitis    Hypertension    Pre-diabetes     Family History  Problem Relation Age of Onset   Diabetes Mother    Hypertension Mother    Diabetes Father    Hypertension Father    Heart failure Father    Multiple sclerosis Sister    Colon cancer Neg Hx    Colon  polyps Neg Hx    Esophageal cancer Neg Hx    Rectal cancer Neg Hx    Stomach cancer Neg Hx     Past Surgical History:  Procedure Laterality Date   BIOPSY  07/19/2022   Procedure: BIOPSY;  Surgeon: Stacia Glendia BRAVO, MD;  Location: [facility];  Service: Gastroenterology;;   CHOLECYSTECTOMY  1988   COLONOSCOPY WITH PROPOFOL  N/A 07/19/2022   Procedure: COLONOSCOPY WITH PROPOFOL ;  Surgeon: Stacia Glendia BRAVO, MD;  Location: [facility];  Service: Gastroenterology;  Laterality: N/A;   HAND SURGERY  1997   POLYPECTOMY N/A 07/19/2022   Procedure: POLYPECTOMY;  Surgeon: Stacia Glendia BRAVO, MD;  Location: [facility];  Service: Gastroenterology;  Laterality: N/A;   SHOULDER ARTHROSCOPY WITH ROTATOR CUFF REPAIR Right 03/25/2024    Procedure: RIGHT SHOULDER ARTHROSCOPY, ROTATOR CUFF REPAIR;  Surgeon: Jerri Kay HERO, MD;  Location: [facility];  Service: Orthopedics;  Laterality: Right;   SUBACROMIAL DECOMPRESSION Right 03/25/2024   Procedure: SUBACROMIAL DECOMPRESSION, EXTENSIVE DEBRIDEMENT;  Surgeon: Jerri Kay HERO, MD;  Location: [facility];  Service: Orthopedics;  Laterality: Right;   Social History   Occupational History   Not on file  Tobacco Use   Smoking status: Some Days    Types: Cigarettes    Passive exposure: Current   Smokeless tobacco: Never   Tobacco comments:    Former occasional smoker started around age 62,   Vaping Use   Vaping status: Never Used  Substance and Sexual Activity   Alcohol use: Not Currently   Drug use: Yes    Types: Marijuana    Comment: last used 9/1/-25   Sexual activity: Yes    Partners: Male    Birth control/protection: None, Post-menopausal    Comment: menarche 58yo, sexual debut 58yo

## 2024-04-03 NOTE — Therapy (Signed)
 OUTPATIENT PHYSICAL THERAPY  EVALUATION   Patient Name: Christina Cannon MRN: [id number] DOB:December 23, 1965, 58 y.o., female Today's Date: 04/06/2024  END OF SESSION:  PT End of Session - 04/06/24 1102     Visit Number 1    Number of Visits 20    Date for PT Re-Evaluation 06/15/24    Authorization Type [facility] [employer] $25 copay    PT Start Time 1100    PT Stop Time 1136    PT Time Calculation (min) 36 min    Activity Tolerance Patient limited by pain    Behavior During Therapy [facility] for tasks assessed/performed          Past Medical History:  Diagnosis Date   Diverticulitis    Hypertension    Pre-diabetes    Past Surgical History:  Procedure Laterality Date   BIOPSY  07/19/2022   Procedure: BIOPSY;  Surgeon: Stacia Glendia BRAVO, MD;  Location: [facility];  Service: Gastroenterology;;   CHOLECYSTECTOMY  1988   COLONOSCOPY WITH PROPOFOL  N/A 07/19/2022   Procedure: COLONOSCOPY WITH PROPOFOL ;  Surgeon: Stacia Glendia BRAVO, MD;  Location: [facility];  Service: Gastroenterology;  Laterality: N/A;   HAND SURGERY  1997   POLYPECTOMY N/A 07/19/2022   Procedure: POLYPECTOMY;  Surgeon: Stacia Glendia BRAVO, MD;  Location: [facility];  Service: Gastroenterology;  Laterality: N/A;   SHOULDER ARTHROSCOPY WITH ROTATOR CUFF REPAIR Right 03/25/2024   Procedure: RIGHT SHOULDER ARTHROSCOPY, ROTATOR CUFF REPAIR;  Surgeon: Jerri Kay HERO, MD;  Location: [facility];  Service: Orthopedics;  Laterality: Right;   SUBACROMIAL DECOMPRESSION Right 03/25/2024   Procedure: SUBACROMIAL DECOMPRESSION, EXTENSIVE DEBRIDEMENT;  Surgeon: Jerri Kay HERO, MD;  Location: [facility];  Service: Orthopedics;  Laterality: Right;   Patient Active Problem List   Diagnosis Date Noted   Arthritis of right acromioclavicular joint 03/25/2024   Impingement syndrome of right shoulder 03/25/2024   Traumatic complete tear of right rotator cuff 02/11/2024   Vitamin D  deficiency, unspecified  05/28/2023   Prediabetes 05/28/2023   Polyp of colon 07/19/2022   Prolonged QT interval 12/21/2021   Colitis 12/21/2021   Diverticulitis 12/20/2021   Hypokalemia 12/20/2021   Obesity (BMI 30-39.9) 12/20/2021   Tobacco use 12/20/2021   History of colonic diverticulitis 07/09/2017   Primary osteoarthritis of both knees 07/09/2017   Primary osteoarthritis of both ankles 07/09/2017   Tobacco dependence 07/09/2017   Elevated blood pressure reading 07/09/2017   Leukocytosis 06/10/2017   Skin tag of vulva 08/10/2015    PCP: Vicci Barnie WENDI BARTON PROVIDER: Jule Ronal CROME, PA-C  REFERRING DIAG: [phone] (ICD-10-CM) - S/P rotator cuff repair  THERAPY DIAG:  Chronic right shoulder pain  Muscle weakness (generalized)  Localized edema  Abnormal posture  Rationale for Evaluation and Treatment: Rehabilitation  ONSET DATE: Surgery 03/25/2024  SUBJECTIVE:  SUBJECTIVE STATEMENT: Pt s/p Rt rotator cuff repair 03/25/2024.  Reported 10+/10 pain on first 4 days after surgery.  Reported it started easing up since then.  Reported taking less pain medicine during the day.  Reported nighttime pain noted.  Wearing sling except for changed and bathing, sometimes taking it loose at night.  Pt indicated combination sleeping in bed and chair.  Reported symptoms in upper arm down to fingers at times.   Rt hand dominant.  Pt indicated a fall in Feb/march that seemed to led to increased Rt arm trouble.   PERTINENT HISTORY: S/p right shoulder rotator cuff repair on 03/25/2024  Wearing sling for 5 weeks past 04/01/2024 PA visit   PAIN:  NPRS scale: at worst since surgery 10/10, at best 0/10 Pain location: Rt shoulder/upper arm Pain description: sharp, dull, achy Aggravating factors: nighttime, any arm movement.   Relieving factors: medicine, resting it   PRECAUTIONS: Shoulder :  Rotator cuff repair.  Wearing sling for 5 weeks past 04/01/2024 PA visit   WEIGHT BEARING RESTRICTIONS: Yes Rt shoulder  FALLS:  Has patient fallen in last 6 months? 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  Plan to get back to work.   PLOF: Independent, recreational softball, basketball, soccer at times.  Cooking, yard work/gardening.   PATIENT GOALS:Reduce pain , get back to work and activity.   OBJECTIVE:   PATIENT SURVEYS:  Patient-Specific Activity Scoring Scheme  0 represents "unable to perform." 10 represents "able to perform at prior level. 0 1 2 3 4 5 6 7 8 9  10 (Date and Score)   Activity Eval  04/06/2024    1. Bathing   5    2. Comb hair  2    3. Cook 2   4. sweep 0   5. sleep 4   Score 2.6 avg    Total score = sum of the activity scores/number of activities Minimum detectable change (90%CI) for average score = 2 points Minimum detectable change (90%CI) for single activity score = 3 points  COGNITION: 04/06/2024 Overall cognitive status: WFL     SENSATION: 04/06/2024 No specific testing today.   POSTURE: 04/06/2024 Wearing sling, rounded shoulders noted in sitting.   UPPER EXTREMITY ROM:   ROM Right Eval 04/06/2024 PROM Limited by pian, muscle guarding Left Eval 04/06/2024  Shoulder flexion 100   Shoulder extension    Shoulder abduction 80   Shoulder adduction    Shoulder internal rotation    Shoulder external rotation 0 with arm in 20 deg abduction   Elbow flexion    Elbow extension    Wrist flexion    Wrist extension    Wrist ulnar deviation    Wrist radial deviation    Wrist pronation    Wrist supination    (Blank rows = not tested)  UPPER EXTREMITY MMT:  MMT Right Eval 04/06/2024  No testing due to surgery Left Eval 04/06/2024  Shoulder flexion  5/5  Shoulder extension    Shoulder  abduction  5/5  Shoulder adduction    Shoulder internal rotation  5/5  Shoulder external rotation  5/5  Middle trapezius    Lower trapezius    Elbow flexion    Elbow extension    Wrist flexion    Wrist extension    Wrist ulnar deviation    Wrist radial deviation    Wrist pronation    Wrist supination    Grip strength (lbs)    (  Blank rows = not tested)  SHOULDER SPECIAL TESTS: 04/06/2024 No specific testing   JOINT MOBILITY TESTING:  04/06/2024 Mid range inferior Rt GH joint passive accessory motion WFL.   PALPATION:  04/06/2024 General tenderness around incision, Rt upper arm.                                                                                                                                                                                                   TODAY'S TREATMENT:                                                                                                       DATE: 04/06/2024 Therex:    HEP instruction/performance c cues for techniques, handout provided.  Trial set performed of each for comprehension and symptom assessment.  See below for exercise list  Self Care Sling setup and positioning for proper use at home with tips on don /doff techniques.  Discussed use of sling in all times outside of changing and HEP performance.    Manual  Rt shoulder g2 inferior jt mobs, distraction with PROM in to elevation to tolerance. - done in supine.    PATIENT EDUCATION: 04/06/2024 Education details: HEP, POC Person educated: Patient Education method: Programmer, multimedia, Demonstration, Verbal cues, and Handouts Education comprehension: verbalized understanding, returned demonstration, and verbal cues required  HOME EXERCISE PROGRAM: Access Code: [email or web address] URL: [email or web address] Date: 04/06/2024 Prepared by: Ozell Silvan  Exercises - Supine Shoulder Flexion PROM (Mirrored)  - 2-3 x daily - 7 x weekly - 1-2 sets - 10 reps - 5 hold - Circular  Shoulder Pendulum with Table Support  - 2-3 x daily - 7 x weekly - 1-2 sets - 10 reps - Seated Shoulder Flexion Towel Slide at Table Top  - 2 x daily - 7 x weekly - 2-3 sets - 10 reps - 5 hold - Seated Scapular Retraction  - 3-5 x daily - 7 x weekly - 1 sets - 10 reps - 3-5 hold - Supported Elbow Flexion Extension AROM (Mirrored)  - 1 x daily - 7 x weekly - 3 sets - 10 reps  ASSESSMENT:  CLINICAL IMPRESSION: Patient is a  58 y.o. who comes to clinic with complaints of Rt shoulder pain s/p rotator cuff repair 03/25/2024 with mobility, strength and movement coordination deficits that impair their ability to perform usual daily and recreational functional activities without increase difficulty/symptoms at this time.  Patient to benefit from skilled PT services to address impairments and limitations to improve to previous level of function without restriction secondary to condition.   OBJECTIVE IMPAIRMENTS: decreased activity tolerance, decreased coordination, decreased endurance, decreased mobility, decreased ROM, decreased strength, hypomobility, increased edema, increased fascial restrictions, impaired perceived functional ability, increased muscle spasms, impaired flexibility, impaired UE functional use, improper body mechanics, postural dysfunction, and pain.   ACTIVITY LIMITATIONS: carrying, lifting, sleeping, transfers, bed mobility, bathing, toileting, dressing, self feeding, reach over head, hygiene/grooming, and caring for others  PARTICIPATION LIMITATIONS: meal prep, cleaning, laundry, interpersonal relationship, driving, shopping, community activity, occupation, and yard work  PERSONAL FACTORS: No specific factors are affecting patient's functional outcome.   REHAB POTENTIAL: Good  CLINICAL DECISION MAKING: Stable/uncomplicated  EVALUATION COMPLEXITY: Low   GOALS: Goals reviewed with patient? Yes  SHORT TERM GOALS: (target date for Short term goals are 3 weeks 04/27/2024)  1.Patient  will demonstrate independent use of home exercise program to maintain progress from in clinic treatments. Goal status: New  LONG TERM GOALS: (target dates for all long term goals are 10 weeks  06/15/2024 )   1. Patient will demonstrate/report pain at worst less than or equal to 2/10 to facilitate minimal limitation in daily activity secondary to pain symptoms. Goal status: New   2. Patient will demonstrate independent use of home exercise program to facilitate ability to maintain/progress functional gains from skilled physical therapy services. Goal status: New   3. Patient will demonstrate Patient specific functional scale avg > or = 8/10 to indicate reduced disability due to condition.  Goal status: New   4.  Patient will demonstrate Rt UE MMT 5/5 throughout to facilitate lifting, reaching, carrying at [facility] in daily activity.   Goal status: New   5.  Patient will demonstrate Rt GH joint AROM WFL s symptoms to facilitate usual overhead reaching, self care, dressing at PLOF.    Goal status: New   6.  Patient will demonstrate/report ability to return to work at [employer].  Goal status: New   PLAN:  PT FREQUENCY: 1-2x/week  PT DURATION: 10 weeks  PLANNED INTERVENTIONS: Can include 02853- PT Re-evaluation, 97110-Therapeutic exercises, 97530- Therapeutic activity, [id number]- Neuromuscular re-education, 97535- Self Care, 97140- Manual therapy, [phone]- Gait training, [phone]- Orthotic Fit/training, [phone]- Canalith repositioning, [id number]- Aquatic Therapy, [phone]- Electrical stimulation (unattended), [id number] Physical performance testing, 97016- Vasopneumatic device, [id number]- Ultrasound, [id number]- Traction (mechanical), [id number]- Ionotophoresis 4mg /ml Dexamethasone ,  79439 - Needle insertion w/o injection 1 or 2 muscles, 20561 - Needle insertion w/o injection 3 or more muscles.   Patient/Family education, Balance training, Stair training, Taping, Dry Needling, Joint mobilization, Joint manipulation, Spinal  manipulation, Spinal mobilization, Scar mobilization, Vestibular training, Visual/preceptual remediation/compensation, DME instructions, Cryotherapy, and Moist heat.  All performed as medically necessary.  All included unless contraindicated  PLAN FOR NEXT SESSION: Review HEP knowledge/results.   Passive range focus.   Wearing sling for 5 weeks past 04/01/2024 PA visit (May 06, 2024)  MD office follow up on 05/06/2024.   Ozell Silvan, PT, DPT, OCS, ATC 04/06/24  11:41 AM

## 2024-04-06 ENCOUNTER — Encounter: Payer: Self-pay | Admitting: Rehabilitative and Restorative Service Providers"

## 2024-04-06 ENCOUNTER — Ambulatory Visit: Admitting: Rehabilitative and Restorative Service Providers"

## 2024-04-06 DIAGNOSIS — R293 Abnormal posture: Secondary | ICD-10-CM | POA: Diagnosis not present

## 2024-04-06 DIAGNOSIS — M6281 Muscle weakness (generalized): Secondary | ICD-10-CM | POA: Diagnosis not present

## 2024-04-06 DIAGNOSIS — R600 Localized edema: Secondary | ICD-10-CM

## 2024-04-06 DIAGNOSIS — G8929 Other chronic pain: Secondary | ICD-10-CM | POA: Diagnosis not present

## 2024-04-06 DIAGNOSIS — M25511 Pain in right shoulder: Secondary | ICD-10-CM | POA: Diagnosis not present

## 2024-04-08 ENCOUNTER — Encounter: Payer: Self-pay | Admitting: Physical Therapy

## 2024-04-08 ENCOUNTER — Ambulatory Visit (INDEPENDENT_AMBULATORY_CARE_PROVIDER_SITE_OTHER): Admitting: Physical Therapy

## 2024-04-08 DIAGNOSIS — G8929 Other chronic pain: Secondary | ICD-10-CM | POA: Diagnosis not present

## 2024-04-08 DIAGNOSIS — M25511 Pain in right shoulder: Secondary | ICD-10-CM

## 2024-04-08 DIAGNOSIS — M6281 Muscle weakness (generalized): Secondary | ICD-10-CM

## 2024-04-08 DIAGNOSIS — R600 Localized edema: Secondary | ICD-10-CM

## 2024-04-08 DIAGNOSIS — R293 Abnormal posture: Secondary | ICD-10-CM | POA: Diagnosis not present

## 2024-04-08 NOTE — Therapy (Signed)
 OUTPATIENT PHYSICAL THERAPY  TREATMENT   Patient Name: Christina Cannon MRN: [id number] DOB:13-Jun-1966, 58 y.o., female Today's Date: 04/08/2024  END OF SESSION:  PT End of Session - 04/08/24 1254     Visit Number 2    Number of Visits 20    Date for PT Re-Evaluation 06/15/24    Authorization Type [facility] [employer] $25 copay    PT Start Time 1258    PT Stop Time 1347    PT Time Calculation (min) 49 min    Activity Tolerance Patient limited by pain    Behavior During Therapy [facility] for tasks assessed/performed           Past Medical History:  Diagnosis Date   Diverticulitis    Hypertension    Pre-diabetes    Past Surgical History:  Procedure Laterality Date   BIOPSY  07/19/2022   Procedure: BIOPSY;  Surgeon: Stacia Glendia BRAVO, MD;  Location: [facility];  Service: Gastroenterology;;   CHOLECYSTECTOMY  1988   COLONOSCOPY WITH PROPOFOL  N/A 07/19/2022   Procedure: COLONOSCOPY WITH PROPOFOL ;  Surgeon: Stacia Glendia BRAVO, MD;  Location: [facility];  Service: Gastroenterology;  Laterality: N/A;   HAND SURGERY  1997   POLYPECTOMY N/A 07/19/2022   Procedure: POLYPECTOMY;  Surgeon: Stacia Glendia BRAVO, MD;  Location: [facility];  Service: Gastroenterology;  Laterality: N/A;   SHOULDER ARTHROSCOPY WITH ROTATOR CUFF REPAIR Right 03/25/2024   Procedure: RIGHT SHOULDER ARTHROSCOPY, ROTATOR CUFF REPAIR;  Surgeon: Jerri Kay HERO, MD;  Location: [facility];  Service: Orthopedics;  Laterality: Right;   SUBACROMIAL DECOMPRESSION Right 03/25/2024   Procedure: SUBACROMIAL DECOMPRESSION, EXTENSIVE DEBRIDEMENT;  Surgeon: Jerri Kay HERO, MD;  Location: [facility];  Service: Orthopedics;  Laterality: Right;   Patient Active Problem List   Diagnosis Date Noted   Arthritis of right acromioclavicular joint 03/25/2024   Impingement syndrome of right shoulder 03/25/2024   Traumatic complete tear of right rotator cuff 02/11/2024   Vitamin D  deficiency, unspecified  05/28/2023   Prediabetes 05/28/2023   Polyp of colon 07/19/2022   Prolonged QT interval 12/21/2021   Colitis 12/21/2021   Diverticulitis 12/20/2021   Hypokalemia 12/20/2021   Obesity (BMI 30-39.9) 12/20/2021   Tobacco use 12/20/2021   History of colonic diverticulitis 07/09/2017   Primary osteoarthritis of both knees 07/09/2017   Primary osteoarthritis of both ankles 07/09/2017   Tobacco dependence 07/09/2017   Elevated blood pressure reading 07/09/2017   Leukocytosis 06/10/2017   Skin tag of vulva 08/10/2015    PCP: Vicci Barnie WENDI BARTON PROVIDER: Jule Ronal CROME, PA-C  REFERRING DIAG: [phone] (ICD-10-CM) - S/P rotator cuff repair  THERAPY DIAG:  Chronic right shoulder pain  Muscle weakness (generalized)  Localized edema  Abnormal posture  Rationale for Evaluation and Treatment: Rehabilitation  ONSET DATE: Surgery 03/25/2024  SUBJECTIVE:  SUBJECTIVE STATEMENT: The exercises have been going well.  The sling pulling on her neck.    Rt hand dominant.  Pt indicated a fall in Feb/march that seemed to led to increased Rt arm trouble.   PERTINENT HISTORY: S/p right shoulder rotator cuff repair on 03/25/2024  Wearing sling for 5 weeks past 04/01/2024 PA visit   PAIN:  NPRS scale: at worst since PT 4/10, at best 0/10 Pain location: Rt shoulder/upper arm Pain description: sharp, dull, achy Aggravating factors: nighttime, any arm movement.  Relieving factors: medicine, resting it   PRECAUTIONS: Shoulder :  Rotator cuff repair.  Wearing sling for 5 weeks past 04/01/2024 PA visit   WEIGHT BEARING RESTRICTIONS: Yes Rt shoulder  FALLS:  Has patient fallen in last 6 months? 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  Plan to get back to work.   PLOF: Independent, recreational softball, basketball, soccer at times.  Cooking, yard work/gardening.   PATIENT GOALS:Reduce pain , get back to work and activity.   OBJECTIVE:   PATIENT SURVEYS:  Patient-Specific Activity Scoring Scheme  0 represents "unable to perform." 10 represents "able to perform at prior level. 0 1 2 3 4 5 6 7 8 9  10 (Date and Score)   Activity Eval  04/06/2024    1. Bathing   5    2. Comb hair  2    3. Cook 2   4. sweep 0   5. sleep 4   Score 2.6 avg    Total score = sum of the activity scores/number of activities Minimum detectable change (90%CI) for average score = 2 points Minimum detectable change (90%CI) for single activity score = 3 points  COGNITION: 04/06/2024 Overall cognitive status: WFL     SENSATION: 04/06/2024 No specific testing today.   POSTURE: 04/06/2024 Wearing sling, rounded shoulders noted in sitting.   UPPER EXTREMITY ROM:   ROM Right Eval 04/06/2024 PROM Limited by pian, muscle guarding  Shoulder flexion 100  Shoulder extension   Shoulder abduction 80  Shoulder adduction   Shoulder internal rotation   Shoulder external rotation 0 with arm in 20 deg abduction  Elbow flexion   Elbow extension   Wrist flexion   Wrist extension   Wrist ulnar deviation   Wrist radial deviation   Wrist pronation   Wrist supination   (Blank rows = not tested)  UPPER EXTREMITY MMT:  MMT Right Eval 04/06/2024  No testing due to surgery Left Eval 04/06/2024  Shoulder flexion  5/5  Shoulder extension    Shoulder abduction  5/5  Shoulder adduction    Shoulder internal rotation  5/5  Shoulder external rotation  5/5  Middle trapezius    Lower trapezius    Elbow flexion    Elbow extension    Wrist flexion    Wrist extension    Wrist ulnar deviation    Wrist radial deviation    Wrist pronation    Wrist supination    Grip strength (lbs)    (Blank rows = not tested)  SHOULDER SPECIAL  TESTS: 04/06/2024 No specific testing   JOINT MOBILITY TESTING:  04/06/2024 Mid range inferior Rt GH joint passive accessory motion WFL.   PALPATION:  04/06/2024 General tenderness around incision, Rt upper arm.  TODAY'S TREATMENT:                                                                                                       DATE: 04/08/2024 Therapeutic Exercise: RUE Pendulum starting small and progress to slight stretch: 10 reps each flex/ext, abd/add and circles CW/CCW.  RUE Biceps curls with soft elbow ext stretch 10 reps AA seated, 10 reps supine with palm up, 10 reps supine with palm neutral.  Cervical retraction supine 10 reps with tactile cues for technique. BUE Scapular retraction (focus on limiting shoulder elevation) supine 10 reps with tactile cues for technique. Table top slides with RUE in pillow case with PT assist flexion 10 reps and scaption 10 reps.   Self Care PT reviewed need to wear sling except bathing and changing clothes. PT demo & verbal cues on supporting weight of arm with Yoga block or pillows while wearing sling to decrease pull of strap in sitting, supine and left side lying.  Pt verbalized understanding.  Pt was able to demo proper donning of sling that PT taught at eval.   Manual  Rt shoulder g2 inferior jt mobs for flexion and abduction to 80*, distraction with PROM in to elevation to tolerance. - done in supine.   Vaso right shoulder seated with arm supported 34* medium compression 10 min.    TREATMENT:                                                                                                       DATE: 04/06/2024 Therex:    HEP instruction/performance c cues for techniques, handout provided.  Trial set performed of each for comprehension and symptom  assessment.  See below for exercise list  Self Care Sling setup and positioning for proper use at home with tips on don /doff techniques.  Discussed use of sling in all times outside of changing and HEP performance.    Manual  Rt shoulder g2 inferior jt mobs, distraction with PROM in to elevation to tolerance. - done in supine.    PATIENT EDUCATION: 04/06/2024 Education details: HEP, POC Person educated: Patient Education method: Programmer, multimedia, Demonstration, Verbal cues, and Handouts Education comprehension: verbalized understanding, returned demonstration, and verbal cues required  HOME EXERCISE PROGRAM: Access Code: [email or web address] URL: [email or web address] Date: 04/06/2024 Prepared by: Ozell Silvan  Exercises - Supine Shoulder Flexion PROM (Mirrored)  - 2-3 x daily - 7 x weekly - 1-2 sets - 10 reps - 5 hold - Circular Shoulder Pendulum with Table Support  - 2-3 x daily - 7 x weekly - 1-2 sets - 10 reps - Seated Shoulder Flexion Towel Slide at Table Top  - 2  x daily - 7 x weekly - 2-3 sets - 10 reps - 5 hold - Seated Scapular Retraction  - 3-5 x daily - 7 x weekly - 1 sets - 10 reps - 3-5 hold - Supported Elbow Flexion Extension AROM (Mirrored)  - 1 x daily - 7 x weekly - 3 sets - 10 reps  ASSESSMENT:  CLINICAL IMPRESSION: Pt appears to understand PT recommendations for positioning / support weight of right arm even inside sling.  She improved passive range with PT instruction and repetition.  Patient continues to benefit from skilled PT services to address impairments and limitations to improve to previous level of function without restriction secondary to condition.   OBJECTIVE IMPAIRMENTS: decreased activity tolerance, decreased coordination, decreased endurance, decreased mobility, decreased ROM, decreased strength, hypomobility, increased edema, increased fascial restrictions, impaired perceived functional ability, increased muscle spasms, impaired flexibility,  impaired UE functional use, improper body mechanics, postural dysfunction, and pain.   ACTIVITY LIMITATIONS: carrying, lifting, sleeping, transfers, bed mobility, bathing, toileting, dressing, self feeding, reach over head, hygiene/grooming, and caring for others  PARTICIPATION LIMITATIONS: meal prep, cleaning, laundry, interpersonal relationship, driving, shopping, community activity, occupation, and yard work  PERSONAL FACTORS: No specific factors are affecting patient's functional outcome.   REHAB POTENTIAL: Good  CLINICAL DECISION MAKING: Stable/uncomplicated  EVALUATION COMPLEXITY: Low   GOALS: Goals reviewed with patient? Yes  SHORT TERM GOALS: (target date for Short term goals are 3 weeks 04/27/2024)  1.Patient will demonstrate independent use of home exercise program to maintain progress from in clinic treatments. Goal status:  Ongoing   04/08/2024  LONG TERM GOALS: (target dates for all long term goals are 10 weeks  06/15/2024 )   1. Patient will demonstrate/report pain at worst less than or equal to 2/10 to facilitate minimal limitation in daily activity secondary to pain symptoms. Goal status: Ongoing   04/08/2024   2. Patient will demonstrate independent use of home exercise program to facilitate ability to maintain/progress functional gains from skilled physical therapy services. Goal status:  Ongoing   04/08/2024   3. Patient will demonstrate Patient specific functional scale avg > or = 8/10 to indicate reduced disability due to condition.  Goal status:  Ongoing   04/08/2024   4.  Patient will demonstrate Rt UE MMT 5/5 throughout to facilitate lifting, reaching, carrying at [facility] in daily activity.   Goal status:  Ongoing   04/08/2024   5.  Patient will demonstrate Rt GH joint AROM WFL s symptoms to facilitate usual overhead reaching, self care, dressing at PLOF.    Goal status:  Ongoing   04/08/2024   6.  Patient will demonstrate/report ability to return to work at  [employer].  Goal status: Ongoing   04/08/2024   PLAN:  PT FREQUENCY: 1-2x/week  PT DURATION: 10 weeks  PLANNED INTERVENTIONS: Can include 02853- PT Re-evaluation, 97110-Therapeutic exercises, 97530- Therapeutic activity, 97112- Neuromuscular re-education, 97535- Self Care, 97140- Manual therapy, [phone]- Gait training, [phone]- Orthotic Fit/training, [phone]- Canalith repositioning, [id number]- Aquatic Therapy, [phone]- Electrical stimulation (unattended), [id number] Physical performance testing, 97016- Vasopneumatic device, [id number]- Ultrasound, [id number]- Traction (mechanical), [id number]- Ionotophoresis 4mg /ml Dexamethasone ,  [id number] - Needle insertion w/o injection 1 or 2 muscles, 20561 - Needle insertion w/o injection 3 or more muscles.   Patient/Family education, Balance training, Stair training, Taping, Dry Needling, Joint mobilization, Joint manipulation, Spinal manipulation, Spinal mobilization, Scar mobilization, Vestibular training, Visual/preceptual remediation/compensation, DME instructions, Cryotherapy, and Moist heat.  All performed as medically necessary.  All included unless contraindicated  PLAN  FOR NEXT SESSION: update HEP as appropriate.   Passive range focus.   Wearing sling for 5 weeks past 04/01/2024 PA visit (May 06, 2024)   Grayce Spatz, PT, DPT 04/08/2024, 2:44 PM

## 2024-04-15 ENCOUNTER — Ambulatory Visit (INDEPENDENT_AMBULATORY_CARE_PROVIDER_SITE_OTHER): Admitting: Physical Therapy

## 2024-04-15 ENCOUNTER — Encounter: Payer: Self-pay | Admitting: Physical Therapy

## 2024-04-15 DIAGNOSIS — R293 Abnormal posture: Secondary | ICD-10-CM | POA: Diagnosis not present

## 2024-04-15 DIAGNOSIS — R600 Localized edema: Secondary | ICD-10-CM

## 2024-04-15 DIAGNOSIS — M25511 Pain in right shoulder: Secondary | ICD-10-CM

## 2024-04-15 DIAGNOSIS — M6281 Muscle weakness (generalized): Secondary | ICD-10-CM | POA: Diagnosis not present

## 2024-04-15 DIAGNOSIS — G8929 Other chronic pain: Secondary | ICD-10-CM

## 2024-04-15 NOTE — Therapy (Signed)
 OUTPATIENT PHYSICAL THERAPY  TREATMENT   Patient Name: Christina Cannon MRN: [id number] DOB:1966/02/22, 58 y.o., female Today's Date: 04/15/2024  END OF SESSION:  PT End of Session - 04/15/24 1300     Visit Number 3    Number of Visits 20    Date for Recertification  06/15/24    Authorization Type [facility] [employer] $25 copay    PT Start Time 1258    PT Stop Time 1344    PT Time Calculation (min) 46 min    Activity Tolerance Patient limited by pain    Behavior During Therapy [facility] for tasks assessed/performed            Past Medical History:  Diagnosis Date   Diverticulitis    Hypertension    Pre-diabetes    Past Surgical History:  Procedure Laterality Date   BIOPSY  07/19/2022   Procedure: BIOPSY;  Surgeon: Stacia Glendia BRAVO, MD;  Location: [facility];  Service: Gastroenterology;;   CHOLECYSTECTOMY  1988   COLONOSCOPY WITH PROPOFOL  N/A 07/19/2022   Procedure: COLONOSCOPY WITH PROPOFOL ;  Surgeon: Stacia Glendia BRAVO, MD;  Location: [facility];  Service: Gastroenterology;  Laterality: N/A;   HAND SURGERY  1997   POLYPECTOMY N/A 07/19/2022   Procedure: POLYPECTOMY;  Surgeon: Stacia Glendia BRAVO, MD;  Location: [facility];  Service: Gastroenterology;  Laterality: N/A;   SHOULDER ARTHROSCOPY WITH ROTATOR CUFF REPAIR Right 03/25/2024   Procedure: RIGHT SHOULDER ARTHROSCOPY, ROTATOR CUFF REPAIR;  Surgeon: Jerri Kay HERO, MD;  Location: [facility];  Service: Orthopedics;  Laterality: Right;   SUBACROMIAL DECOMPRESSION Right 03/25/2024   Procedure: SUBACROMIAL DECOMPRESSION, EXTENSIVE DEBRIDEMENT;  Surgeon: Jerri Kay HERO, MD;  Location: [facility];  Service: Orthopedics;  Laterality: Right;   Patient Active Problem List   Diagnosis Date Noted   Arthritis of right acromioclavicular joint 03/25/2024   Impingement syndrome of right shoulder 03/25/2024   Traumatic complete tear of right rotator cuff 02/11/2024   Vitamin D  deficiency, unspecified  05/28/2023   Prediabetes 05/28/2023   Polyp of colon 07/19/2022   Prolonged QT interval 12/21/2021   Colitis 12/21/2021   Diverticulitis 12/20/2021   Hypokalemia 12/20/2021   Obesity (BMI 30-39.9) 12/20/2021   Tobacco use 12/20/2021   History of colonic diverticulitis 07/09/2017   Primary osteoarthritis of both knees 07/09/2017   Primary osteoarthritis of both ankles 07/09/2017   Tobacco dependence 07/09/2017   Elevated blood pressure reading 07/09/2017   Leukocytosis 06/10/2017   Skin tag of vulva 08/10/2015    PCP: Vicci Barnie WENDI BARTON PROVIDER: Jule Ronal CROME, PA-C  REFERRING DIAG: [phone] (ICD-10-CM) - S/P rotator cuff repair  THERAPY DIAG:  Chronic right shoulder pain  Muscle weakness (generalized)  Localized edema  Abnormal posture  Rationale for Evaluation and Treatment: Rehabilitation  ONSET DATE: Surgery 03/25/2024  SUBJECTIVE:  SUBJECTIVE STATEMENT: Patient reports that exercises and supporting UE even when in sling has helped.  Her sister's dog tried to take food out of her right hand and she pulled back with her right arm.  This caused some pain spike that has decreased but still sore some.   Rt hand dominant.    PERTINENT HISTORY: S/p right shoulder rotator cuff repair on 03/25/2024  Wearing sling for 5 weeks past 04/01/2024 PA visit   PAIN:  NPRS scale: at worst since PT  6/10, at best 0/10 Pain location: Rt shoulder/upper arm Pain description: sharp, dull, achy Aggravating factors: nighttime, any arm movement.  Relieving factors: medicine, resting it   PRECAUTIONS: Shoulder :  Rotator cuff repair.  Wearing sling for 5 weeks past 04/01/2024 PA visit   WEIGHT BEARING RESTRICTIONS: Yes Rt shoulder  FALLS:  Has patient fallen in last 6 months? 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  Plan to get back to work.   PLOF: Independent, recreational softball, basketball, soccer at times.  Cooking, yard work/gardening.   PATIENT GOALS:Reduce pain , get back to work and activity.   OBJECTIVE:   PATIENT SURVEYS:  Patient-Specific Activity Scoring Scheme  0 represents "unable to perform." 10 represents "able to perform at prior level. 0 1 2 3 4 5 6 7 8 9  10 (Date and Score)   Activity Eval  04/06/2024    1. Bathing   5    2. Comb hair  2    3. Cook 2   4. sweep 0   5. sleep 4   Score 2.6 avg    Total score = sum of the activity scores/number of activities Minimum detectable change (90%CI) for average score = 2 points Minimum detectable change (90%CI) for single activity score = 3 points  COGNITION: 04/06/2024 Overall cognitive status: WFL     SENSATION: 04/06/2024 No specific testing today.   POSTURE: 04/06/2024 Wearing sling, rounded shoulders noted in sitting.   UPPER EXTREMITY ROM:   ROM Right Eval 04/06/2024 PROM Limited by pian, muscle guarding  Shoulder flexion 100  Shoulder extension   Shoulder abduction 80  Shoulder adduction   Shoulder internal rotation   Shoulder external rotation 0 with arm in 20 deg abduction  Elbow flexion   Elbow extension   Wrist flexion   Wrist extension   Wrist ulnar deviation   Wrist radial deviation   Wrist pronation   Wrist supination   (Blank rows = not tested)  UPPER EXTREMITY MMT:  MMT Right Eval 04/06/2024  No testing due to surgery Left Eval 04/06/2024  Shoulder flexion  5/5  Shoulder extension    Shoulder abduction  5/5  Shoulder adduction    Shoulder internal rotation  5/5  Shoulder external rotation  5/5  Middle trapezius    Lower trapezius    Elbow flexion    Elbow extension    Wrist flexion    Wrist extension    Wrist ulnar deviation    Wrist radial deviation    Wrist  pronation    Wrist supination    Grip strength (lbs)    (Blank rows = not tested)  SHOULDER SPECIAL TESTS: 04/06/2024 No specific testing   JOINT MOBILITY TESTING:  04/06/2024 Mid range inferior Rt GH joint passive accessory motion WFL.   PALPATION:  04/06/2024 General tenderness around incision, Rt upper arm.  TODAY'S TREATMENT:                                                                                                       DATE: 04/15/2024 Therapeutic Exercise: RUE Pendulum starting small and progress to slight stretch: 10 reps each flex/ext, abd/add and circles CW/CCW. A set each At beginning and end of session.   RUE Biceps curls with soft elbow ext stretch 10 reps AA seated, 10 reps supine with palm up, 10 reps supine with palm neutral.  RUE triceps ext AROM with PT stabilizing humerus 10 reps.  Cervical retraction supine 10 reps with tactile cues for technique. BUE Scapular retraction (focus on limiting shoulder elevation) supine 10 reps with tactile cues for technique. BUE scapular depression supine 10 reps.  PT reviewed HEP and positioning while patient on vaso  Manual  Rt shoulder g2 inferior jt mobs for flexion and abduction to 80*, distraction with PROM in to elevation to tolerance. - done in supine.   Vaso right shoulder seated with arm supported 34* medium compression 10 min.   TREATMENT:                                                                                                       DATE: 04/08/2024 Therapeutic Exercise: RUE Pendulum starting small and progress to slight stretch: 10 reps each flex/ext, abd/add and circles CW/CCW.  RUE Biceps curls with soft elbow ext stretch 10 reps AA seated, 10 reps supine with palm up, 10 reps supine with palm neutral.  Cervical  retraction supine 10 reps with tactile cues for technique. BUE Scapular retraction (focus on limiting shoulder elevation) supine 10 reps with tactile cues for technique. Table top slides with RUE in pillow case with PT assist flexion 10 reps and scaption 10 reps.   Self Care PT reviewed need to wear sling except bathing and changing clothes. PT demo & verbal cues on supporting weight of arm with Yoga block or pillows while wearing sling to decrease pull of strap in sitting, supine and left side lying.  Pt verbalized understanding.  Pt was able to demo proper donning of sling that PT taught at eval.   Manual  Rt shoulder g2 inferior jt mobs for flexion and abduction to 80*, distraction with PROM in to elevation to tolerance. - done in supine.   Vaso right shoulder seated with arm supported 34* medium compression 10 min.    TREATMENT:  DATE:  04/06/2024 Therex:    HEP instruction/performance c cues for techniques, handout provided.  Trial set performed of each for comprehension and symptom assessment.  See below for exercise list  Self Care Sling setup and positioning for proper use at home with tips on don /doff techniques.  Discussed use of sling in all times outside of changing and HEP performance.    Manual  Rt shoulder g2 inferior jt mobs, distraction with PROM in to elevation to tolerance. - done in supine.    PATIENT EDUCATION: 04/06/2024 Education details: HEP, POC Person educated: Patient Education method: Programmer, multimedia, Demonstration, Verbal cues, and Handouts Education comprehension: verbalized understanding, returned demonstration, and verbal cues required  HOME EXERCISE PROGRAM: Access Code: [email or web address] URL: [email or web address] Date: 04/06/2024 Prepared by: Ozell Silvan  Exercises - Supine Shoulder Flexion PROM (Mirrored)  - 2-3 x daily - 7 x weekly - 1-2  sets - 10 reps - 5 hold - Circular Shoulder Pendulum with Table Support  - 2-3 x daily - 7 x weekly - 1-2 sets - 10 reps - Seated Shoulder Flexion Towel Slide at Table Top  - 2 x daily - 7 x weekly - 2-3 sets - 10 reps - 5 hold - Seated Scapular Retraction  - 3-5 x daily - 7 x weekly - 1 sets - 10 reps - 3-5 hold - Supported Elbow Flexion Extension AROM (Mirrored)  - 1 x daily - 7 x weekly - 3 sets - 10 reps   ASSESSMENT:  CLINICAL IMPRESSION: Patient is reporting less pain.  She verbalizes compliance with recommendations for positioning and HEP.  Patient PROM improved with less guarding.   OBJECTIVE IMPAIRMENTS: decreased activity tolerance, decreased coordination, decreased endurance, decreased mobility, decreased ROM, decreased strength, hypomobility, increased edema, increased fascial restrictions, impaired perceived functional ability, increased muscle spasms, impaired flexibility, impaired UE functional use, improper body mechanics, postural dysfunction, and pain.   ACTIVITY LIMITATIONS: carrying, lifting, sleeping, transfers, bed mobility, bathing, toileting, dressing, self feeding, reach over head, hygiene/grooming, and caring for others  PARTICIPATION LIMITATIONS: meal prep, cleaning, laundry, interpersonal relationship, driving, shopping, community activity, occupation, and yard work  PERSONAL FACTORS: No specific factors are affecting patient's functional outcome.   REHAB POTENTIAL: Good  CLINICAL DECISION MAKING: Stable/uncomplicated  EVALUATION COMPLEXITY: Low   GOALS: Goals reviewed with patient? Yes  SHORT TERM GOALS: (target date for Short term goals are 3 weeks 04/27/2024)  1.Patient will demonstrate independent use of home exercise program to maintain progress from in clinic treatments. Goal status:  Ongoing     04/15/2024  LONG TERM GOALS: (target dates for all long term goals are 10 weeks  06/15/2024 )   1. Patient will demonstrate/report pain at worst less than  or equal to 2/10 to facilitate minimal limitation in daily activity secondary to pain symptoms. Goal status: Ongoing    04/15/2024   2. Patient will demonstrate independent use of home exercise program to facilitate ability to maintain/progress functional gains from skilled physical therapy services. Goal status:  Ongoing     04/15/2024   3. Patient will demonstrate Patient specific functional scale avg > or = 8/10 to indicate reduced disability due to condition.  Goal status:  Ongoing   04/15/2024   4.  Patient will demonstrate Rt UE MMT 5/5 throughout to facilitate lifting, reaching, carrying at [facility] in daily activity.   Goal status:  Ongoing   04/15/2024   5.  Patient will demonstrate Rt GH joint AROM WFL s symptoms to facilitate usual overhead  reaching, self care, dressing at PLOF.    Goal status:  Ongoing  04/15/2024   6.  Patient will demonstrate/report ability to return to work at [employer].  Goal status: Ongoing   04/15/2024   PLAN:  PT FREQUENCY: 1-2x/week  PT DURATION: 10 weeks  PLANNED INTERVENTIONS: Can include 02853- PT Re-evaluation, 97110-Therapeutic exercises, 97530- Therapeutic activity, 97112- Neuromuscular re-education, 97535- Self Care, 97140- Manual therapy, [phone]- Gait training, [phone]- Orthotic Fit/training, [phone]- Canalith repositioning, [id number]- Aquatic Therapy, [phone]- Electrical stimulation (unattended), [id number] Physical performance testing, 97016- Vasopneumatic device, [id number]- Ultrasound, [id number]- Traction (mechanical), [id number]- Ionotophoresis 4mg /ml Dexamethasone ,  [id number] - Needle insertion w/o injection 1 or 2 muscles, 20561 - Needle insertion w/o injection 3 or more muscles.   Patient/Family education, Balance training, Stair training, Taping, Dry Needling, Joint mobilization, Joint manipulation, Spinal manipulation, Spinal mobilization, Scar mobilization, Vestibular training, Visual/preceptual remediation/compensation, DME instructions, Cryotherapy, and Moist heat.  All performed  as medically necessary.  All included unless contraindicated  PLAN FOR NEXT SESSION: follow protocol for large tear repair in cabinet. 4 weeks will be 10/1.   update HEP as appropriate.   Passive range focus.   Wearing sling for 5 weeks past 04/01/2024 PA visit (May 06, 2024)   Grayce Spatz, PT, DPT 04/15/2024, 5:27 PM

## 2024-04-20 NOTE — Therapy (Signed)
 OUTPATIENT PHYSICAL THERAPY  TREATMENT   Patient Name: Christina Cannon MRN: [id number] DOB:[birth year], 58 y.o., female Today's Date: 04/21/2024  END OF SESSION:  PT End of Session - 04/21/24 1257     Visit Number 4    Number of Visits 20    Date for Recertification  06/15/24    Authorization Type [facility] [employer] $25 copay    PT Start Time 1303    PT Stop Time 1354    PT Time Calculation (min) 51 min    Activity Tolerance Patient limited by pain    Behavior During Therapy [facility] for tasks assessed/performed             Past Medical History:  Diagnosis Date   Diverticulitis    Hypertension    Pre-diabetes    Past Surgical History:  Procedure Laterality Date   BIOPSY  07/19/2022   Procedure: BIOPSY;  Surgeon: Stacia Glendia BRAVO, MD;  Location: [facility];  Service: Gastroenterology;;   CHOLECYSTECTOMY  1988   COLONOSCOPY WITH PROPOFOL  N/A 07/19/2022   Procedure: COLONOSCOPY WITH PROPOFOL ;  Surgeon: Stacia Glendia BRAVO, MD;  Location: [facility];  Service: Gastroenterology;  Laterality: N/A;   HAND SURGERY  1997   POLYPECTOMY N/A 07/19/2022   Procedure: POLYPECTOMY;  Surgeon: Stacia Glendia BRAVO, MD;  Location: [facility];  Service: Gastroenterology;  Laterality: N/A;   SHOULDER ARTHROSCOPY WITH ROTATOR CUFF REPAIR Right 03/25/2024   Procedure: RIGHT SHOULDER ARTHROSCOPY, ROTATOR CUFF REPAIR;  Surgeon: Jerri Kay HERO, MD;  Location: [facility];  Service: Orthopedics;  Laterality: Right;   SUBACROMIAL DECOMPRESSION Right 03/25/2024   Procedure: SUBACROMIAL DECOMPRESSION, EXTENSIVE DEBRIDEMENT;  Surgeon: Jerri Kay HERO, MD;  Location: [facility];  Service: Orthopedics;  Laterality: Right;   Patient Active Problem List   Diagnosis Date Noted   Arthritis of right acromioclavicular joint 03/25/2024   Impingement syndrome of right shoulder 03/25/2024   Traumatic complete tear of right rotator cuff 02/11/2024   Vitamin D  deficiency, unspecified  05/28/2023   Prediabetes 05/28/2023   Polyp of colon 07/19/2022   Prolonged QT interval 12/21/2021   Colitis 12/21/2021   Diverticulitis 12/20/2021   Hypokalemia 12/20/2021   Obesity (BMI 30-39.9) 12/20/2021   Tobacco use 12/20/2021   History of colonic diverticulitis 07/09/2017   Primary osteoarthritis of both knees 07/09/2017   Primary osteoarthritis of both ankles 07/09/2017   Tobacco dependence 07/09/2017   Elevated blood pressure reading 07/09/2017   Leukocytosis 06/10/2017   Skin tag of vulva 08/10/2015    PCP: Vicci Barnie WENDI BARTON PROVIDER: Jule Ronal CROME, PA-C  REFERRING DIAG: [phone] (ICD-10-CM) - S/P rotator cuff repair  THERAPY DIAG:  Chronic right shoulder pain  Muscle weakness (generalized)  Localized edema  Abnormal posture  Rationale for Evaluation and Treatment: Rehabilitation  ONSET DATE: Surgery 03/25/2024  SUBJECTIVE:  SUBJECTIVE STATEMENT: Patient reporting sleep has not been going well recently and rates pain 5/10 this session.  Rt hand dominant.    PERTINENT HISTORY: S/p right shoulder rotator cuff repair on 03/25/2024  Wearing sling for 5 weeks past 04/01/2024 PA visit   PAIN:  NPRS scale: at worst since PT  6/10, at best 0/10 Pain location: Rt shoulder/upper arm Pain description: sharp, dull, achy Aggravating factors: nighttime, any arm movement.  Relieving factors: medicine, resting it   PRECAUTIONS: Shoulder :  Rotator cuff repair.  Wearing sling for 5 weeks past 04/01/2024 PA visit   WEIGHT BEARING RESTRICTIONS: Yes Rt shoulder  FALLS:  Has patient fallen in last 6 months? 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  Plan to get back to work.   PLOF: Independent,  recreational softball, basketball, soccer at times.  Cooking, yard work/gardening.   PATIENT GOALS:Reduce pain , get back to work and activity.   OBJECTIVE:   PATIENT SURVEYS:  Patient-Specific Activity Scoring Scheme  0 represents "unable to perform." 10 represents "able to perform at prior level. 0 1 2 3 4 5 6 7 8 9  10 (Date and Score)   Activity Eval  04/06/2024    1. Bathing   5    2. Comb hair  2    3. Cook 2   4. sweep 0   5. sleep 4   Score 2.6 avg    Total score = sum of the activity scores/number of activities Minimum detectable change (90%CI) for average score = 2 points Minimum detectable change (90%CI) for single activity score = 3 points  COGNITION: 04/06/2024 Overall cognitive status: WFL     SENSATION: 04/06/2024 No specific testing today.   POSTURE: 04/06/2024 Wearing sling, rounded shoulders noted in sitting.   UPPER EXTREMITY ROM:   ROM Right Eval 04/06/2024 PROM Limited by pian, muscle guarding  Shoulder flexion 100  Shoulder extension   Shoulder abduction 80  Shoulder adduction   Shoulder internal rotation   Shoulder external rotation 0 with arm in 20 deg abduction  Elbow flexion   Elbow extension   Wrist flexion   Wrist extension   Wrist ulnar deviation   Wrist radial deviation   Wrist pronation   Wrist supination   (Blank rows = not tested)  UPPER EXTREMITY MMT:  MMT Right Eval 04/06/2024  No testing due to surgery Left Eval 04/06/2024  Shoulder flexion  5/5  Shoulder extension    Shoulder abduction  5/5  Shoulder adduction    Shoulder internal rotation  5/5  Shoulder external rotation  5/5  Middle trapezius    Lower trapezius    Elbow flexion    Elbow extension    Wrist flexion    Wrist extension    Wrist ulnar deviation    Wrist radial deviation    Wrist pronation    Wrist supination    Grip strength (lbs)    (Blank rows = not tested)  SHOULDER SPECIAL TESTS: 04/06/2024 No specific testing   JOINT  MOBILITY TESTING:  04/06/2024 Mid range inferior Rt GH joint passive accessory motion WFL.   PALPATION:  04/06/2024 General tenderness around incision, Rt upper arm.  TODAY'S TREATMENT:                                                                                                       DATE: 04/21/2024 TherEx:  Rt UE pendulums, fwd/back, lateral, and CW/CCW circles; performed at beginning of session and end  Supine AROM ER from 0-30deg x20; PT measuring out 30deg for appropriate AROM to follow protocol Supine bicep curls AROM-AAROM ; supinated hand 1x10 with 3s stretch into elbow extension; neutral grip 1x10 with 3s stretch into elbow extension  Rt tricep extension AROM with PT stabilizing humerus 1x12 Supine scapular retractions 1x10 with 2s hold; intermittent verbal cues for appropriate technique to decrease shoulder shrug  Supine scapular depression 1x10 with 2s hold ; demonstration provided with appropriate carryover from patient  Supine cervical retraction 1x10 with 2s hold  Seated UT stretch 1x30s each side   Manual:  Rt shoulder abduction and flexion PROM 0-80deg with grade 2 inferior glide in supine   Vaso:  Seated Rt shoulder with arm supported for 10 minutes with medium compression at 34deg   TODAY'S TREATMENT:                                                                                                       DATE: 04/15/2024 Therapeutic Exercise: RUE Pendulum starting small and progress to slight stretch: 10 reps each flex/ext, abd/add and circles CW/CCW. A set each At beginning and end of session.   RUE Biceps curls with soft elbow ext stretch 10 reps AA seated, 10 reps supine with palm up, 10 reps supine with palm neutral.  RUE triceps ext AROM with PT stabilizing humerus 10 reps.   Cervical retraction supine 10 reps with tactile cues for technique. BUE Scapular retraction (focus on limiting shoulder elevation) supine 10 reps with tactile cues for technique. BUE scapular depression supine 10 reps.  PT reviewed HEP and positioning while patient on vaso  Manual  Rt shoulder g2 inferior jt mobs for flexion and abduction to 80*, distraction with PROM in to elevation to tolerance. - done in supine.   Vaso right shoulder seated with arm supported 34* medium compression 10 min.   TREATMENT:  DATE: 04/08/2024 Therapeutic Exercise: RUE Pendulum starting small and progress to slight stretch: 10 reps each flex/ext, abd/add and circles CW/CCW.  RUE Biceps curls with soft elbow ext stretch 10 reps AA seated, 10 reps supine with palm up, 10 reps supine with palm neutral.  Cervical retraction supine 10 reps with tactile cues for technique. BUE Scapular retraction (focus on limiting shoulder elevation) supine 10 reps with tactile cues for technique. Table top slides with RUE in pillow case with PT assist flexion 10 reps and scaption 10 reps.   Self Care PT reviewed need to wear sling except bathing and changing clothes. PT demo & verbal cues on supporting weight of arm with Yoga block or pillows while wearing sling to decrease pull of strap in sitting, supine and left side lying.  Pt verbalized understanding.  Pt was able to demo proper donning of sling that PT taught at eval.   Manual  Rt shoulder g2 inferior jt mobs for flexion and abduction to 80*, distraction with PROM in to elevation to tolerance. - done in supine.   Vaso right shoulder seated with arm supported 34* medium compression 10 min.    TREATMENT:                                                                                                       DATE:  04/06/2024 Therex:    HEP instruction/performance c cues for  techniques, handout provided.  Trial set performed of each for comprehension and symptom assessment.  See below for exercise list  Self Care Sling setup and positioning for proper use at home with tips on don /doff techniques.  Discussed use of sling in all times outside of changing and HEP performance.    Manual  Rt shoulder g2 inferior jt mobs, distraction with PROM in to elevation to tolerance. - done in supine.    PATIENT EDUCATION: 04/06/2024 Education details: HEP, POC Person educated: Patient Education method: Programmer, multimedia, Demonstration, Verbal cues, and Handouts Education comprehension: verbalized understanding, returned demonstration, and verbal cues required  HOME EXERCISE PROGRAM: Access Code: [email or web address] URL: [email or web address] Date: 04/06/2024 Prepared by: Ozell Silvan  Exercises - Supine Shoulder Flexion PROM (Mirrored)  - 2-3 x daily - 7 x weekly - 1-2 sets - 10 reps - 5 hold - Circular Shoulder Pendulum with Table Support  - 2-3 x daily - 7 x weekly - 1-2 sets - 10 reps - Seated Shoulder Flexion Towel Slide at Table Top  - 2 x daily - 7 x weekly - 2-3 sets - 10 reps - 5 hold - Seated Scapular Retraction  - 3-5 x daily - 7 x weekly - 1 sets - 10 reps - 3-5 hold - Supported Elbow Flexion Extension AROM (Mirrored)  - 1 x daily - 7 x weekly - 3 sets - 10 reps   ASSESSMENT:  CLINICAL IMPRESSION: Patient arrived to session noting no change in symptoms, though had a hard time sleeping last night secondary to bicep and anterior shoulder pain. Patient tolerated all activities this date with no increase in pain. Patient will continue  to benefit from skilled PT.   OBJECTIVE IMPAIRMENTS: decreased activity tolerance, decreased coordination, decreased endurance, decreased mobility, decreased ROM, decreased strength, hypomobility, increased edema, increased fascial restrictions, impaired perceived functional ability, increased muscle spasms, impaired flexibility,  impaired UE functional use, improper body mechanics, postural dysfunction, and pain.   ACTIVITY LIMITATIONS: carrying, lifting, sleeping, transfers, bed mobility, bathing, toileting, dressing, self feeding, reach over head, hygiene/grooming, and caring for others  PARTICIPATION LIMITATIONS: meal prep, cleaning, laundry, interpersonal relationship, driving, shopping, community activity, occupation, and yard work  PERSONAL FACTORS: No specific factors are affecting patient's functional outcome.   REHAB POTENTIAL: Good  CLINICAL DECISION MAKING: Stable/uncomplicated  EVALUATION COMPLEXITY: Low   GOALS: Goals reviewed with patient? Yes  SHORT TERM GOALS: (target date for Short term goals are 3 weeks 04/27/2024)  1.Patient will demonstrate independent use of home exercise program to maintain progress from in clinic treatments. Goal status:  Ongoing     04/15/2024  LONG TERM GOALS: (target dates for all long term goals are 10 weeks  06/15/2024 )   1. Patient will demonstrate/report pain at worst less than or equal to 2/10 to facilitate minimal limitation in daily activity secondary to pain symptoms. Goal status: Ongoing    04/15/2024   2. Patient will demonstrate independent use of home exercise program to facilitate ability to maintain/progress functional gains from skilled physical therapy services. Goal status:  Ongoing     04/15/2024   3. Patient will demonstrate Patient specific functional scale avg > or = 8/10 to indicate reduced disability due to condition.  Goal status:  Ongoing   04/15/2024   4.  Patient will demonstrate Rt UE MMT 5/5 throughout to facilitate lifting, reaching, carrying at [facility] in daily activity.   Goal status:  Ongoing   04/15/2024   5.  Patient will demonstrate Rt GH joint AROM WFL s symptoms to facilitate usual overhead reaching, self care, dressing at PLOF.    Goal status:  Ongoing  04/15/2024   6.  Patient will demonstrate/report ability to return to work  at [employer].  Goal status: Ongoing   04/15/2024   PLAN:  PT FREQUENCY: 1-2x/week  PT DURATION: 10 weeks  PLANNED INTERVENTIONS: Can include 02853- PT Re-evaluation, 97110-Therapeutic exercises, 97530- Therapeutic activity, 97112- Neuromuscular re-education, 97535- Self Care, 97140- Manual therapy, [phone]- Gait training, [phone]- Orthotic Fit/training, [phone]- Canalith repositioning, [id number]- Aquatic Therapy, [phone]- Electrical stimulation (unattended), [id number] Physical performance testing, 97016- Vasopneumatic device, [id number]- Ultrasound, [id number]- Traction (mechanical), [id number]- Ionotophoresis 4mg /ml Dexamethasone ,  [id number] - Needle insertion w/o injection 1 or 2 muscles, 20561 - Needle insertion w/o injection 3 or more muscles.   Patient/Family education, Balance training, Stair training, Taping, Dry Needling, Joint mobilization, Joint manipulation, Spinal manipulation, Spinal mobilization, Scar mobilization, Vestibular training, Visual/preceptual remediation/compensation, DME instructions, Cryotherapy, and Moist heat.  All performed as medically necessary.  All included unless contraindicated  PLAN FOR NEXT SESSION:  follow protocol for large tear repair in cabinet. 4 weeks will be 10/1.   update HEP as appropriate.   Passive range focus.   Wearing sling for 5 weeks past 04/01/2024 PA visit (May 06, 2024)   Susannah Daring, PT, DPT 04/21/24 2:23 PM

## 2024-04-21 ENCOUNTER — Ambulatory Visit (INDEPENDENT_AMBULATORY_CARE_PROVIDER_SITE_OTHER)

## 2024-04-21 DIAGNOSIS — G8929 Other chronic pain: Secondary | ICD-10-CM

## 2024-04-21 DIAGNOSIS — R293 Abnormal posture: Secondary | ICD-10-CM

## 2024-04-21 DIAGNOSIS — M6281 Muscle weakness (generalized): Secondary | ICD-10-CM | POA: Diagnosis not present

## 2024-04-21 DIAGNOSIS — R600 Localized edema: Secondary | ICD-10-CM

## 2024-04-21 DIAGNOSIS — M25511 Pain in right shoulder: Secondary | ICD-10-CM

## 2024-04-23 ENCOUNTER — Ambulatory Visit: Admitting: Rehabilitative and Restorative Service Providers"

## 2024-04-23 ENCOUNTER — Encounter: Payer: Self-pay | Admitting: Rehabilitative and Restorative Service Providers"

## 2024-04-23 DIAGNOSIS — R293 Abnormal posture: Secondary | ICD-10-CM | POA: Diagnosis not present

## 2024-04-23 DIAGNOSIS — G8929 Other chronic pain: Secondary | ICD-10-CM | POA: Diagnosis not present

## 2024-04-23 DIAGNOSIS — R600 Localized edema: Secondary | ICD-10-CM

## 2024-04-23 DIAGNOSIS — M25511 Pain in right shoulder: Secondary | ICD-10-CM | POA: Diagnosis not present

## 2024-04-23 DIAGNOSIS — M6281 Muscle weakness (generalized): Secondary | ICD-10-CM

## 2024-04-23 NOTE — Therapy (Signed)
 OUTPATIENT PHYSICAL THERAPY  TREATMENT   Patient Name: Christina Cannon MRN: [id number] DOB:06-21-1966, 58 y.o., female Today's Date: 04/23/2024  END OF SESSION:  PT End of Session - 04/23/24 1255     Visit Number 5    Number of Visits 20    Date for Recertification  06/15/24    Authorization Type [facility] [employer] $25 copay    PT Start Time 1258    PT Stop Time 1347    PT Time Calculation (min) 49 min    Activity Tolerance Patient limited by pain    Behavior During Therapy [facility] for tasks assessed/performed              Past Medical History:  Diagnosis Date   Diverticulitis    Hypertension    Pre-diabetes    Past Surgical History:  Procedure Laterality Date   BIOPSY  07/19/2022   Procedure: BIOPSY;  Surgeon: Stacia Glendia BRAVO, MD;  Location: [facility];  Service: Gastroenterology;;   CHOLECYSTECTOMY  1988   COLONOSCOPY WITH PROPOFOL  N/A 07/19/2022   Procedure: COLONOSCOPY WITH PROPOFOL ;  Surgeon: Stacia Glendia BRAVO, MD;  Location: [facility];  Service: Gastroenterology;  Laterality: N/A;   HAND SURGERY  1997   POLYPECTOMY N/A 07/19/2022   Procedure: POLYPECTOMY;  Surgeon: Stacia Glendia BRAVO, MD;  Location: [facility];  Service: Gastroenterology;  Laterality: N/A;   SHOULDER ARTHROSCOPY WITH ROTATOR CUFF REPAIR Right 03/25/2024   Procedure: RIGHT SHOULDER ARTHROSCOPY, ROTATOR CUFF REPAIR;  Surgeon: Jerri Kay HERO, MD;  Location: [facility];  Service: Orthopedics;  Laterality: Right;   SUBACROMIAL DECOMPRESSION Right 03/25/2024   Procedure: SUBACROMIAL DECOMPRESSION, EXTENSIVE DEBRIDEMENT;  Surgeon: Jerri Kay HERO, MD;  Location: [facility];  Service: Orthopedics;  Laterality: Right;   Patient Active Problem List   Diagnosis Date Noted   Arthritis of right acromioclavicular joint 03/25/2024   Impingement syndrome of right shoulder 03/25/2024   Traumatic complete tear of right rotator cuff 02/11/2024   Vitamin D  deficiency, unspecified  05/28/2023   Prediabetes 05/28/2023   Polyp of colon 07/19/2022   Prolonged QT interval 12/21/2021   Colitis 12/21/2021   Diverticulitis 12/20/2021   Hypokalemia 12/20/2021   Obesity (BMI 30-39.9) 12/20/2021   Tobacco use 12/20/2021   History of colonic diverticulitis 07/09/2017   Primary osteoarthritis of both knees 07/09/2017   Primary osteoarthritis of both ankles 07/09/2017   Tobacco dependence 07/09/2017   Elevated blood pressure reading 07/09/2017   Leukocytosis 06/10/2017   Skin tag of vulva 08/10/2015    PCP: Vicci Barnie WENDI BARTON PROVIDER: Jule Ronal CROME, PA-C  REFERRING DIAG: [phone] (ICD-10-CM) - S/P rotator cuff repair  THERAPY DIAG:  Chronic right shoulder pain  Muscle weakness (generalized)  Localized edema  Abnormal posture  Rationale for Evaluation and Treatment: Rehabilitation  ONSET DATE: Surgery 03/25/2024  SUBJECTIVE:  SUBJECTIVE STATEMENT: Pt indicated sore in front of Rt shoulder, up to a 6/10 with some movements.  Pt indicated not really sleeping better yet.  Going back and forth from couch to bed.    Rt hand dominant.    PERTINENT HISTORY: S/p right shoulder rotator cuff repair on 03/25/2024  Wearing sling for 5 weeks past 04/01/2024 PA visit   PAIN:  NPRS scale: up to 6/10 Pain location: Rt shoulder/upper arm Pain description: sharp, dull, achy Aggravating factors: nighttime, any arm movement.  Relieving factors: medicine, resting it   PRECAUTIONS: Shoulder :  Rotator cuff repair.  Wearing sling for 5 weeks past 04/01/2024 PA visit   WEIGHT BEARING RESTRICTIONS: Yes Rt shoulder  FALLS:  Has patient fallen in last 6 months? 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  Plan to  get back to work.   PLOF: Independent, recreational softball, basketball, soccer at times.  Cooking, yard work/gardening.   PATIENT GOALS:Reduce pain , get back to work and activity.   OBJECTIVE:   PATIENT SURVEYS:  Patient-Specific Activity Scoring Scheme  0 represents "unable to perform." 10 represents "able to perform at prior level. 0 1 2 3 4 5 6 7 8 9  10 (Date and Score)   Activity Eval  04/06/2024    1. Bathing   5    2. Comb hair  2    3. Cook 2   4. sweep 0   5. sleep 4   Score 2.6 avg    Total score = sum of the activity scores/number of activities Minimum detectable change (90%CI) for average score = 2 points Minimum detectable change (90%CI) for single activity score = 3 points  COGNITION: 04/06/2024 Overall cognitive status: WFL     SENSATION: 04/06/2024 No specific testing today.   POSTURE: 04/06/2024 Wearing sling, rounded shoulders noted in sitting.   UPPER EXTREMITY ROM:   ROM Right Eval 04/06/2024 PROM Limited by pian, muscle guarding Right 04/23/2024  PROM in supine  Shoulder flexion 100 130  Shoulder extension    Shoulder abduction 80 110  Shoulder adduction    Shoulder internal rotation  65 in 45 deg abduction   Shoulder external rotation 0 with arm in 20 deg abduction 65 in 45 deg abduction  Elbow flexion    Elbow extension    Wrist flexion    Wrist extension    Wrist ulnar deviation    Wrist radial deviation    Wrist pronation    Wrist supination    (Blank rows = not tested)  UPPER EXTREMITY MMT:  MMT Right Eval 04/06/2024  No testing due to surgery Left Eval 04/06/2024  Shoulder flexion  5/5  Shoulder extension    Shoulder abduction  5/5  Shoulder adduction    Shoulder internal rotation  5/5  Shoulder external rotation  5/5  Middle trapezius    Lower trapezius    Elbow flexion    Elbow extension    Wrist flexion    Wrist extension    Wrist ulnar deviation    Wrist radial deviation    Wrist pronation    Wrist  supination    Grip strength (lbs)    (Blank rows = not tested)  SHOULDER SPECIAL TESTS: 04/06/2024 No specific testing   JOINT MOBILITY TESTING:  04/06/2024 Mid range inferior Rt GH joint passive accessory motion WFL.   PALPATION:  04/06/2024 General tenderness around incision, Rt upper arm.  TODAY'S TREATMENT:                                                                                                       DATE: 04/23/2024 TherEx:  Supine wand AAROM flexion 2 x 10 with 2-3 sec hold at end range Supine wand AAROM ER stretch in 45 deg 2-3 sec hold x 10  Seated scapular retraction 5 sec hold x 10  Pulley flexion with arm close to body 3 mins with 2-3 sec holds , scaption 3 mins with 2-3 sec holds    Manual:  Rt shoulder inferior glides g3, posterior glide mobilization c movement c passive ER.   Vaso:  Seated Rt shoulder with arm supported for 10 minutes with medium compression at 34deg   TODAY'S TREATMENT:                                                                                                       DATE: 04/21/2024 TherEx:  Rt UE pendulums, fwd/back, lateral, and CW/CCW circles; performed at beginning of session and end  Supine AROM ER from 0-30deg x20; PT measuring out 30deg for appropriate AROM to follow protocol Supine bicep curls AROM-AAROM ; supinated hand 1x10 with 3s stretch into elbow extension; neutral grip 1x10 with 3s stretch into elbow extension  Rt tricep extension AROM with PT stabilizing humerus 1x12 Supine scapular retractions 1x10 with 2s hold; intermittent verbal cues for appropriate technique to decrease shoulder shrug  Supine scapular depression 1x10 with 2s hold ; demonstration provided with appropriate carryover from patient  Supine cervical retraction 1x10 with  2s hold  Seated UT stretch 1x30s each side   Manual:  Rt shoulder abduction and flexion PROM 0-80deg with grade 2 inferior glide in supine   Vaso:  Seated Rt shoulder with arm supported for 10 minutes with medium compression at 34deg   TODAY'S TREATMENT:                                                                                                       DATE: 04/15/2024 Therapeutic Exercise: RUE Pendulum starting small and progress to slight stretch: 10 reps each flex/ext, abd/add and circles CW/CCW. A set each At beginning and end of session.  RUE Biceps curls with soft elbow ext stretch 10 reps AA seated, 10 reps supine with palm up, 10 reps supine with palm neutral.  RUE triceps ext AROM with PT stabilizing humerus 10 reps.  Cervical retraction supine 10 reps with tactile cues for technique. BUE Scapular retraction (focus on limiting shoulder elevation) supine 10 reps with tactile cues for technique. BUE scapular depression supine 10 reps.  PT reviewed HEP and positioning while patient on vaso  Manual  Rt shoulder g2 inferior jt mobs for flexion and abduction to 80*, distraction with PROM in to elevation to tolerance. - done in supine.   Vaso right shoulder seated with arm supported 34* medium compression 10 min.   TREATMENT:                                                                                                       DATE: 04/08/2024 Therapeutic Exercise: RUE Pendulum starting small and progress to slight stretch: 10 reps each flex/ext, abd/add and circles CW/CCW.  RUE Biceps curls with soft elbow ext stretch 10 reps AA seated, 10 reps supine with palm up, 10 reps supine with palm neutral.  Cervical retraction supine 10 reps with tactile cues for technique. BUE Scapular retraction (focus on limiting shoulder elevation) supine 10 reps with tactile cues for technique. Table top slides with RUE in pillow case with PT assist flexion 10 reps and scaption 10 reps.   Self  Care PT reviewed need to wear sling except bathing and changing clothes. PT demo & verbal cues on supporting weight of arm with Yoga block or pillows while wearing sling to decrease pull of strap in sitting, supine and left side lying.  Pt verbalized understanding.  Pt was able to demo proper donning of sling that PT taught at eval.   Manual  Rt shoulder g2 inferior jt mobs for flexion and abduction to 80*, distraction with PROM in to elevation to tolerance. - done in supine.   Vaso right shoulder seated with arm supported 34* medium compression 10 min.     PATIENT EDUCATION: 04/06/2024 Education details: HEP, POC Person educated: Patient Education method: Programmer, multimedia, Demonstration, Verbal cues, and Handouts Education comprehension: verbalized understanding, returned demonstration, and verbal cues required  HOME EXERCISE PROGRAM: Access Code: [email or web address] URL: [email or web address] Date: 04/06/2024 Prepared by: Ozell Silvan  Exercises - Supine Shoulder Flexion PROM (Mirrored)  - 2-3 x daily - 7 x weekly - 1-2 sets - 10 reps - 5 hold - Circular Shoulder Pendulum with Table Support  - 2-3 x daily - 7 x weekly - 1-2 sets - 10 reps - Seated Shoulder Flexion Towel Slide at Table Top  - 2 x daily - 7 x weekly - 2-3 sets - 10 reps - 5 hold - Seated Scapular Retraction  - 3-5 x daily - 7 x weekly - 1 sets - 10 reps - 3-5 hold - Supported Elbow Flexion Extension AROM (Mirrored)  - 1 x daily - 7 x weekly - 3 sets - 10 reps   ASSESSMENT:  CLINICAL IMPRESSION: 4 weeks post op on 04/22/2024.  Progressed to include AAROM initiation per [facility] rotator cuff protocol.   Good tolerance overall in clinic to use today.  Range of motion passively improved today compared to evaluation presentation.  Continued skilled PT services indicated at this time.   OBJECTIVE IMPAIRMENTS: decreased activity tolerance, decreased coordination, decreased endurance, decreased mobility, decreased ROM,  decreased strength, hypomobility, increased edema, increased fascial restrictions, impaired perceived functional ability, increased muscle spasms, impaired flexibility, impaired UE functional use, improper body mechanics, postural dysfunction, and pain.   ACTIVITY LIMITATIONS: carrying, lifting, sleeping, transfers, bed mobility, bathing, toileting, dressing, self feeding, reach over head, hygiene/grooming, and caring for others  PARTICIPATION LIMITATIONS: meal prep, cleaning, laundry, interpersonal relationship, driving, shopping, community activity, occupation, and yard work  PERSONAL FACTORS: No specific factors are affecting patient's functional outcome.   REHAB POTENTIAL: Good  CLINICAL DECISION MAKING: Stable/uncomplicated  EVALUATION COMPLEXITY: Low   GOALS: Goals reviewed with patient? Yes  SHORT TERM GOALS: (target date for Short term goals are 3 weeks 04/27/2024)  1.Patient will demonstrate independent use of home exercise program to maintain progress from in clinic treatments. Goal status:  Ongoing     04/15/2024  LONG TERM GOALS: (target dates for all long term goals are 10 weeks  06/15/2024 )   1. Patient will demonstrate/report pain at worst less than or equal to 2/10 to facilitate minimal limitation in daily activity secondary to pain symptoms. Goal status: Ongoing    04/15/2024   2. Patient will demonstrate independent use of home exercise program to facilitate ability to maintain/progress functional gains from skilled physical therapy services. Goal status:  Ongoing     04/15/2024   3. Patient will demonstrate Patient specific functional scale avg > or = 8/10 to indicate reduced disability due to condition.  Goal status:  Ongoing   04/15/2024   4.  Patient will demonstrate Rt UE MMT 5/5 throughout to facilitate lifting, reaching, carrying at [facility] in daily activity.   Goal status:  Ongoing   04/15/2024   5.  Patient will demonstrate Rt GH joint AROM WFL s symptoms to  facilitate usual overhead reaching, self care, dressing at PLOF.    Goal status:  Ongoing  04/15/2024   6.  Patient will demonstrate/report ability to return to work at [employer].  Goal status: Ongoing   04/15/2024   PLAN:  PT FREQUENCY: 1-2x/week  PT DURATION: 10 weeks  PLANNED INTERVENTIONS: Can include 02853- PT Re-evaluation, 97110-Therapeutic exercises, 97530- Therapeutic activity, 97112- Neuromuscular re-education, 97535- Self Care, 97140- Manual therapy, [phone]- Gait training, [phone]- Orthotic Fit/training, [phone]- Canalith repositioning, [id number]- Aquatic Therapy, [phone]- Electrical stimulation (unattended), [id number] Physical performance testing, 97016- Vasopneumatic device, [id number]- Ultrasound, [id number]- Traction (mechanical), [id number]- Ionotophoresis 4mg /ml Dexamethasone ,  [id number] - Needle insertion w/o injection 1 or 2 muscles, 20561 - Needle insertion w/o injection 3 or more muscles.   Patient/Family education, Balance training, Stair training, Taping, Dry Needling, Joint mobilization, Joint manipulation, Spinal manipulation, Spinal mobilization, Scar mobilization, Vestibular training, Visual/preceptual remediation/compensation, DME instructions, Cryotherapy, and Moist heat.  All performed as medically necessary.  All included unless contraindicated  PLAN FOR NEXT SESSION:  Passive range and early AAROM.  Isometric at 5 weeks possible.  Return to MD scheduled for 05/06/2024.   Wearing sling for 5 weeks past 04/01/2024 PA visit (May 06, 2024)   Ozell Silvan, PT, DPT, OCS, ATC 04/23/24  1:30 PM

## 2024-04-28 ENCOUNTER — Ambulatory Visit: Admitting: Rehabilitative and Restorative Service Providers"

## 2024-04-28 ENCOUNTER — Encounter: Payer: Self-pay | Admitting: Rehabilitative and Restorative Service Providers"

## 2024-04-28 DIAGNOSIS — R293 Abnormal posture: Secondary | ICD-10-CM

## 2024-04-28 DIAGNOSIS — R600 Localized edema: Secondary | ICD-10-CM | POA: Diagnosis not present

## 2024-04-28 DIAGNOSIS — M6281 Muscle weakness (generalized): Secondary | ICD-10-CM

## 2024-04-28 DIAGNOSIS — G8929 Other chronic pain: Secondary | ICD-10-CM

## 2024-04-28 DIAGNOSIS — M25511 Pain in right shoulder: Secondary | ICD-10-CM

## 2024-04-28 NOTE — Therapy (Signed)
 OUTPATIENT PHYSICAL THERAPY  TREATMENT   Patient Name: Christina Cannon MRN: [id number] DOB:08/10/1965, 58 y.o., female Today's Date: 04/28/2024  END OF SESSION:  PT End of Session - 04/28/24 1307     Visit Number 6    Number of Visits 20    Date for Recertification  06/15/24    Authorization Type [facility] [employer] $25 copay    PT Start Time 1257    PT Stop Time 1346    PT Time Calculation (min) 49 min    Activity Tolerance Patient tolerated treatment well    Behavior During Therapy [facility] for tasks assessed/performed               Past Medical History:  Diagnosis Date   Diverticulitis    Hypertension    Pre-diabetes    Past Surgical History:  Procedure Laterality Date   BIOPSY  07/19/2022   Procedure: BIOPSY;  Surgeon: Stacia Glendia BRAVO, MD;  Location: [facility];  Service: Gastroenterology;;   CHOLECYSTECTOMY  1988   COLONOSCOPY WITH PROPOFOL  N/A 07/19/2022   Procedure: COLONOSCOPY WITH PROPOFOL ;  Surgeon: Stacia Glendia BRAVO, MD;  Location: [facility];  Service: Gastroenterology;  Laterality: N/A;   HAND SURGERY  1997   POLYPECTOMY N/A 07/19/2022   Procedure: POLYPECTOMY;  Surgeon: Stacia Glendia BRAVO, MD;  Location: [facility];  Service: Gastroenterology;  Laterality: N/A;   SHOULDER ARTHROSCOPY WITH ROTATOR CUFF REPAIR Right 03/25/2024   Procedure: RIGHT SHOULDER ARTHROSCOPY, ROTATOR CUFF REPAIR;  Surgeon: Jerri Kay HERO, MD;  Location: [facility];  Service: Orthopedics;  Laterality: Right;   SUBACROMIAL DECOMPRESSION Right 03/25/2024   Procedure: SUBACROMIAL DECOMPRESSION, EXTENSIVE DEBRIDEMENT;  Surgeon: Jerri Kay HERO, MD;  Location: [facility];  Service: Orthopedics;  Laterality: Right;   Patient Active Problem List   Diagnosis Date Noted   Arthritis of right acromioclavicular joint 03/25/2024   Impingement syndrome of right shoulder 03/25/2024   Traumatic complete tear of right rotator cuff 02/11/2024   Vitamin D  deficiency,  unspecified 05/28/2023   Prediabetes 05/28/2023   Polyp of colon 07/19/2022   Prolonged QT interval 12/21/2021   Colitis 12/21/2021   Diverticulitis 12/20/2021   Hypokalemia 12/20/2021   Obesity (BMI 30-39.9) 12/20/2021   Tobacco use 12/20/2021   History of colonic diverticulitis 07/09/2017   Primary osteoarthritis of both knees 07/09/2017   Primary osteoarthritis of both ankles 07/09/2017   Tobacco dependence 07/09/2017   Elevated blood pressure reading 07/09/2017   Leukocytosis 06/10/2017   Skin tag of vulva 08/10/2015    PCP: Vicci Barnie WENDI BARTON PROVIDER: Jule Ronal CROME, PA-C  REFERRING DIAG: [id number] (ICD-10-CM) - S/P rotator cuff repair  THERAPY DIAG:  Chronic right shoulder pain  Muscle weakness (generalized)  Localized edema  Abnormal posture  Rationale for Evaluation and Treatment: Rehabilitation  ONSET DATE: Surgery 03/25/2024  SUBJECTIVE:  SUBJECTIVE STATEMENT: Pt indicated no specific pain increase today and upon arrival.    Rt hand dominant.    PERTINENT HISTORY: S/p right shoulder rotator cuff repair on 03/25/2024  Wearing sling for 5 weeks past 04/01/2024 PA visit   PAIN:  NPRS scale: no specific pain upon arrival.  Pain location: Rt shoulder/upper arm Pain description: sharp, dull, achy Aggravating factors: nighttime, any arm movement.  Relieving factors: medicine, resting it   PRECAUTIONS: Shoulder :  Rotator cuff repair.  Wearing sling for 5 weeks past 04/01/2024 PA visit   WEIGHT BEARING RESTRICTIONS: Yes Rt shoulder  FALLS:  Has patient fallen in last 6 months? 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  Plan to get back to work.   PLOF: Independent, recreational softball,  basketball, soccer at times.  Cooking, yard work/gardening.   PATIENT GOALS:Reduce pain , get back to work and activity.   OBJECTIVE:   PATIENT SURVEYS:  Patient-Specific Activity Scoring Scheme  0 represents "unable to perform." 10 represents "able to perform at prior level. 0 1 2 3 4 5 6 7 8 9  10 (Date and Score)   Activity Eval  04/06/2024    1. Bathing   5    2. Comb hair  2    3. Cook 2   4. sweep 0   5. sleep 4   Score 2.6 avg    Total score = sum of the activity scores/number of activities Minimum detectable change (90%CI) for average score = 2 points Minimum detectable change (90%CI) for single activity score = 3 points  COGNITION: 04/06/2024 Overall cognitive status: WFL     SENSATION: 04/06/2024 No specific testing today.   POSTURE: 04/06/2024 Wearing sling, rounded shoulders noted in sitting.   UPPER EXTREMITY ROM:   ROM Right Eval 04/06/2024 PROM Limited by pian, muscle guarding Right 04/23/2024  PROM in supine Right 04/28/2024  Shoulder flexion 100 130 150 AAROM with wand in supine   Shoulder extension     Shoulder abduction 80 110   Shoulder adduction     Shoulder internal rotation  65 in 45 deg abduction    Shoulder external rotation 0 with arm in 20 deg abduction 65 in 45 deg abduction   Elbow flexion     Elbow extension     Wrist flexion     Wrist extension     Wrist ulnar deviation     Wrist radial deviation     Wrist pronation     Wrist supination     (Blank rows = not tested)  UPPER EXTREMITY MMT:  MMT Right Eval 04/06/2024  No testing due to surgery Left Eval 04/06/2024  Shoulder flexion  5/5  Shoulder extension    Shoulder abduction  5/5  Shoulder adduction    Shoulder internal rotation  5/5  Shoulder external rotation  5/5  Middle trapezius    Lower trapezius    Elbow flexion    Elbow extension    Wrist flexion    Wrist extension    Wrist ulnar deviation    Wrist radial deviation    Wrist pronation    Wrist  supination    Grip strength (lbs)    (Blank rows = not tested)  SHOULDER SPECIAL TESTS: 04/06/2024 No specific testing   JOINT MOBILITY TESTING:  04/06/2024 Mid range inferior Rt GH joint passive accessory motion WFL.   PALPATION:  04/06/2024 General tenderness around incision, Rt upper arm.  TODAY'S TREATMENT:                                                                                                       DATE: 04/28/2024 TherEx:  Pulley Rt flexion with arm extended 3 mins with 2-3 sec holds , scaption 3 mins with 2-3 sec holds  Supine wand AAROM flexion 2 x 10 with 2-3 sec hold at end range  Neuro Re-ed (muscle activation, scapular control) Supine AAROM bilateral 1 lb bar protraction 3 sec hold x 10  Seated scapular retraction 5 sec hold x 10   Manual:  Rt shoulder inferior glides g3, posterior glide mobilization c movement c passive ER.    Vaso:  Seated Rt shoulder with arm supported for 10 minutes with medium compression at 34deg   TODAY'S TREATMENT:                                                                                                       DATE: 04/23/2024 TherEx:  Supine wand AAROM flexion 2 x 10 with 2-3 sec hold at end range Supine wand AAROM ER stretch in 45 deg 2-3 sec hold x 10  Seated scapular retraction 5 sec hold x 10     Manual:  Rt shoulder inferior glides g3, posterior glide mobilization c movement c passive ER.   Vaso:  Seated Rt shoulder with arm supported for 10 minutes with medium compression at 34deg   TODAY'S TREATMENT:                                                                                                       DATE: 04/21/2024 TherEx:  Rt UE pendulums, fwd/back, lateral, and CW/CCW circles; performed at beginning of session and end   Supine AROM ER from 0-30deg x20; PT measuring out 30deg for appropriate AROM to follow protocol Supine bicep curls AROM-AAROM ; supinated hand 1x10 with 3s stretch into elbow extension; neutral grip 1x10 with 3s stretch into elbow extension  Rt tricep extension AROM with PT stabilizing humerus 1x12 Supine scapular retractions 1x10 with 2s hold; intermittent verbal cues for appropriate technique to decrease shoulder shrug  Supine scapular depression 1x10 with 2s hold ; demonstration provided with appropriate carryover from patient  Supine cervical retraction 1x10 with 2s hold  Seated UT stretch 1x30s each side   Manual:  Rt shoulder abduction and flexion PROM 0-80deg with grade 2 inferior glide in supine   Vaso:  Seated Rt shoulder with arm supported for 10 minutes with medium compression at 34deg   TODAY'S TREATMENT:                                                                                                       DATE: 04/15/2024 Therapeutic Exercise: RUE Pendulum starting small and progress to slight stretch: 10 reps each flex/ext, abd/add and circles CW/CCW. A set each At beginning and end of session.   RUE Biceps curls with soft elbow ext stretch 10 reps AA seated, 10 reps supine with palm up, 10 reps supine with palm neutral.  RUE triceps ext AROM with PT stabilizing humerus 10 reps.  Cervical retraction supine 10 reps with tactile cues for technique. BUE Scapular retraction (focus on limiting shoulder elevation) supine 10 reps with tactile cues for technique. BUE scapular depression supine 10 reps.  PT reviewed HEP and positioning while patient on vaso  Manual  Rt shoulder g2 inferior jt mobs for flexion and abduction to 80*, distraction with PROM in to elevation to tolerance. - done in supine.   Vaso right shoulder seated with arm supported 34* medium compression 10 min.     PATIENT EDUCATION: 04/06/2024 Education details: HEP, POC Person educated: Patient Education  method: Programmer, multimedia, Demonstration, Verbal cues, and Handouts Education comprehension: verbalized understanding, returned demonstration, and verbal cues required  HOME EXERCISE PROGRAM: Access Code: [email or web address] URL: [email or web address] Date: 04/06/2024 Prepared by: Ozell Silvan  Exercises - Supine Shoulder Flexion PROM (Mirrored)  - 2-3 x daily - 7 x weekly - 1-2 sets - 10 reps - 5 hold - Circular Shoulder Pendulum with Table Support  - 2-3 x daily - 7 x weekly - 1-2 sets - 10 reps - Seated Shoulder Flexion Towel Slide at Table Top  - 2 x daily - 7 x weekly - 2-3 sets - 10 reps - 5 hold - Seated Scapular Retraction  - 3-5 x daily - 7 x weekly - 1 sets - 10 reps - 3-5 hold - Supported Elbow Flexion Extension AROM (Mirrored)  - 1 x daily - 7 x weekly - 3 sets - 10 reps   ASSESSMENT:  CLINICAL IMPRESSION: Continued improvement in passive/AAROM range.  Pt making good progress to maximize range in time prior to strengthening program initiation possible after next MD /PA visit.   OBJECTIVE IMPAIRMENTS: decreased activity tolerance, decreased coordination, decreased endurance, decreased mobility, decreased ROM, decreased strength, hypomobility, increased edema, increased fascial restrictions, impaired perceived functional ability, increased muscle spasms, impaired flexibility, impaired UE functional use, improper body mechanics, postural dysfunction, and pain.   ACTIVITY LIMITATIONS: carrying, lifting, sleeping, transfers, bed mobility, bathing, toileting, dressing, self feeding, reach over head, hygiene/grooming, and caring for others  PARTICIPATION LIMITATIONS: meal prep, cleaning, laundry, interpersonal relationship, driving, shopping, community activity, occupation, and yard work  PERSONAL FACTORS: No specific factors are affecting patient's functional outcome.  REHAB POTENTIAL: Good  CLINICAL DECISION MAKING: Stable/uncomplicated  EVALUATION COMPLEXITY:  Low   GOALS: Goals reviewed with patient? Yes  SHORT TERM GOALS: (target date for Short term goals are 3 weeks 04/27/2024)  1.Patient will demonstrate independent use of home exercise program to maintain progress from in clinic treatments. Goal status:  Ongoing     04/15/2024  LONG TERM GOALS: (target dates for all long term goals are 10 weeks  06/15/2024 )   1. Patient will demonstrate/report pain at worst less than or equal to 2/10 to facilitate minimal limitation in daily activity secondary to pain symptoms. Goal status: Ongoing    04/15/2024   2. Patient will demonstrate independent use of home exercise program to facilitate ability to maintain/progress functional gains from skilled physical therapy services. Goal status:  Ongoing     04/15/2024   3. Patient will demonstrate Patient specific functional scale avg > or = 8/10 to indicate reduced disability due to condition.  Goal status:  Ongoing   04/15/2024   4.  Patient will demonstrate Rt UE MMT 5/5 throughout to facilitate lifting, reaching, carrying at [facility] in daily activity.   Goal status:  Ongoing   04/15/2024   5.  Patient will demonstrate Rt GH joint AROM WFL s symptoms to facilitate usual overhead reaching, self care, dressing at PLOF.    Goal status:  Ongoing  04/15/2024   6.  Patient will demonstrate/report ability to return to work at [employer].  Goal status: Ongoing   04/15/2024   PLAN:  PT FREQUENCY: 1-2x/week  PT DURATION: 10 weeks  PLANNED INTERVENTIONS: Can include 02853- PT Re-evaluation, 97110-Therapeutic exercises, 97530- Therapeutic activity, 97112- Neuromuscular re-education, 97535- Self Care, 97140- Manual therapy, [phone]- Gait training, [phone]- Orthotic Fit/training, [phone]- Canalith repositioning, [id number]- Aquatic Therapy, [phone]- Electrical stimulation (unattended), [id number] Physical performance testing, 97016- Vasopneumatic device, [id number]- Ultrasound, [id number]- Traction (mechanical), [id number]- Ionotophoresis 4mg /ml  Dexamethasone ,  79439 - Needle insertion w/o injection 1 or 2 muscles, 20561 - Needle insertion w/o injection 3 or more muscles.   Patient/Family education, Balance training, Stair training, Taping, Dry Needling, Joint mobilization, Joint manipulation, Spinal manipulation, Spinal mobilization, Scar mobilization, Vestibular training, Visual/preceptual remediation/compensation, DME instructions, Cryotherapy, and Moist heat.  All performed as medically necessary.  All included unless contraindicated  PLAN FOR NEXT SESSION:  Passive range and early AAROM.  Early isometrics at 5 weeks possible.    Return to MD scheduled for 05/06/2024.   Wearing sling for 5 weeks past 04/01/2024 PA visit (May 06, 2024)   Ozell Silvan, PT, DPT, OCS, ATC 04/28/24  1:36 PM

## 2024-04-30 ENCOUNTER — Ambulatory Visit (INDEPENDENT_AMBULATORY_CARE_PROVIDER_SITE_OTHER): Admitting: Rehabilitative and Restorative Service Providers"

## 2024-04-30 ENCOUNTER — Encounter: Payer: Self-pay | Admitting: Rehabilitative and Restorative Service Providers"

## 2024-04-30 DIAGNOSIS — M25511 Pain in right shoulder: Secondary | ICD-10-CM

## 2024-04-30 DIAGNOSIS — R600 Localized edema: Secondary | ICD-10-CM

## 2024-04-30 DIAGNOSIS — M6281 Muscle weakness (generalized): Secondary | ICD-10-CM | POA: Diagnosis not present

## 2024-04-30 DIAGNOSIS — R293 Abnormal posture: Secondary | ICD-10-CM | POA: Diagnosis not present

## 2024-04-30 DIAGNOSIS — G8929 Other chronic pain: Secondary | ICD-10-CM | POA: Diagnosis not present

## 2024-04-30 NOTE — Therapy (Signed)
 OUTPATIENT PHYSICAL THERAPY  TREATMENT   Patient Name: Christina Cannon MRN: [id number] DOB:01/10/1966, 58 y.o., female Today's Date: 04/30/2024  END OF SESSION:  PT End of Session - 04/30/24 1306     Number of Visits 20    Date for Recertification  06/15/24    Authorization Type [facility] [employer] $25 copay    PT Start Time 1303    PT Stop Time 1352    PT Time Calculation (min) 49 min    Activity Tolerance Patient tolerated treatment well    Behavior During Therapy [facility] for tasks assessed/performed                Past Medical History:  Diagnosis Date   Diverticulitis    Hypertension    Pre-diabetes    Past Surgical History:  Procedure Laterality Date   BIOPSY  07/19/2022   Procedure: BIOPSY;  Surgeon: Stacia Glendia BRAVO, MD;  Location: [facility];  Service: Gastroenterology;;   CHOLECYSTECTOMY  1988   COLONOSCOPY WITH PROPOFOL  N/A 07/19/2022   Procedure: COLONOSCOPY WITH PROPOFOL ;  Surgeon: Stacia Glendia BRAVO, MD;  Location: [facility];  Service: Gastroenterology;  Laterality: N/A;   HAND SURGERY  1997   POLYPECTOMY N/A 07/19/2022   Procedure: POLYPECTOMY;  Surgeon: Stacia Glendia BRAVO, MD;  Location: [facility];  Service: Gastroenterology;  Laterality: N/A;   SHOULDER ARTHROSCOPY WITH ROTATOR CUFF REPAIR Right 03/25/2024   Procedure: RIGHT SHOULDER ARTHROSCOPY, ROTATOR CUFF REPAIR;  Surgeon: Jerri Kay HERO, MD;  Location: [facility];  Service: Orthopedics;  Laterality: Right;   SUBACROMIAL DECOMPRESSION Right 03/25/2024   Procedure: SUBACROMIAL DECOMPRESSION, EXTENSIVE DEBRIDEMENT;  Surgeon: Jerri Kay HERO, MD;  Location:  SUR[facility]ERY C[facility];  Service: Orthopedics;  Laterality: Right;   Patient Active Problem List   Diagnosis Date Noted   Arthritis of right acromioclavicular joint 03/25/2024   Impingement syndrome of right shoulder 03/25/2024   Traumatic complete tear of right rotator cuff 02/11/2024   Vitamin D  deficiency, unspecified  05/28/2023   Prediabetes 05/28/2023   Polyp of colon 07/19/2022   Prolonged QT interval 12/21/2021   Colitis 12/21/2021   Diverticulitis 12/20/2021   Hypokalemia 12/20/2021   Obesity (BMI 30-39.9) 12/20/2021   Tobacco use 12/20/2021   History of colonic diverticulitis 07/09/2017   Primary osteoarthritis of both knees 07/09/2017   Primary osteoarthritis of both ankles 07/09/2017   Tobacco dependence 07/09/2017   Elevated blood pressure reading 07/09/2017   Leukocytosis 06/10/2017   Skin tag of vulva 08/10/2015    PCP: Vicci Barnie WENDI BARTON PROVIDER: Jule Ronal CROME, PA-C  REFERRING DIAG: [id number] (ICD-10-CM) - S/P rotator cuff repair  THERAPY DIAG:  Chronic right shoulder pain  Muscle weakness (generalized)  Localized edema  Abnormal posture  Rationale for Evaluation and Treatment: Rehabilitation  ONSET DATE: Surgery 03/25/2024  SUBJECTIVE:  SUBJECTIVE STATEMENT: Pt indicated no pain at rest.  Pt indicated a little better with sleep but still having trouble.    Rt hand dominant.    PERTINENT HISTORY: S/p right shoulder rotator cuff repair on 03/25/2024  Wearing sling for 5 weeks past 04/01/2024 PA visit   PAIN:  NPRS scale: 0/10 upon arrival. At worst 4/10 in last week.  Pain location: Rt shoulder/upper arm Pain description: sharp, dull, achy Aggravating factors: nighttime, any arm movement.  Relieving factors: medicine, resting it   PRECAUTIONS: Shoulder :  Rotator cuff repair.  Wearing sling for 5 weeks past 04/01/2024 PA visit   WEIGHT BEARING RESTRICTIONS: Yes Rt shoulder  FALLS:  Has patient fallen in last 6 months? 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  Plan to get back to work.   PLOF:  Independent, recreational softball, basketball, soccer at times.  Cooking, yard work/gardening.   PATIENT GOALS:Reduce pain , get back to work and activity.   OBJECTIVE:   PATIENT SURVEYS:  Patient-Specific Activity Scoring Scheme  0 represents "unable to perform." 10 represents "able to perform at prior level. 0 1 2 3 4 5 6 7 8 9  10 (Date and Score)   Activity Eval  04/06/2024    1. Bathing   5    2. Comb hair  2    3. Cook 2   4. sweep 0   5. sleep 4   Score 2.6 avg    Total score = sum of the activity scores/number of activities Minimum detectable change (90%CI) for average score = 2 points Minimum detectable change (90%CI) for single activity score = 3 points  COGNITION: 04/06/2024 Overall cognitive status: WFL     SENSATION: 04/06/2024 No specific testing today.   POSTURE: 04/06/2024 Wearing sling, rounded shoulders noted in sitting.   UPPER EXTREMITY ROM:   ROM Right Eval 04/06/2024 PROM Limited by pian, muscle guarding Right 04/23/2024  PROM in supine Right 04/28/2024  Shoulder flexion 100 130 150 AAROM with wand in supine   Shoulder extension     Shoulder abduction 80 110   Shoulder adduction     Shoulder internal rotation  65 in 45 deg abduction    Shoulder external rotation 0 with arm in 20 deg abduction 65 in 45 deg abduction   Elbow flexion     Elbow extension     Wrist flexion     Wrist extension     Wrist ulnar deviation     Wrist radial deviation     Wrist pronation     Wrist supination     (Blank rows = not tested)  UPPER EXTREMITY MMT:  MMT Right Eval 04/06/2024  No testing due to surgery Left Eval 04/06/2024  Shoulder flexion  5/5  Shoulder extension    Shoulder abduction  5/5  Shoulder adduction    Shoulder internal rotation  5/5  Shoulder external rotation  5/5  Middle trapezius    Lower trapezius    Elbow flexion    Elbow extension    Wrist flexion    Wrist extension    Wrist ulnar deviation    Wrist radial  deviation    Wrist pronation    Wrist supination    Grip strength (lbs)    (Blank rows = not tested)  SHOULDER SPECIAL TESTS: 04/06/2024 No specific testing   JOINT MOBILITY TESTING:  04/06/2024 Mid range inferior Rt GH joint passive accessory motion WFL.  PALPATION:  04/06/2024 General tenderness around incision, Rt upper arm.                                                                                                                                                                                                  TODAY'S TREATMENT:                                                                                                       DATE: 04/30/2024 TherEx:  P[place]ley Rt flexion with arm extended 3 mins with 2-3 sec holds , scaption 3 mins with 2-3 sec holds  Supine wand AAROM flexion x 15  with 2-3 sec hold at end range Supine AROM flexion x 10  Supine wand AAROM ER in 45 deg abduction 5 sec hold x 10   HEP update with handout provided.    Neuro Re-ed (muscle activation, scapular control) Standing isometric submax, non pain increasing amount 5 sec on /off x 12 each (added to HEP).  Extended education on focus on performing at level of not pain increasing.    Vaso:  Seated Rt shoulder with arm supported for 10 minutes with medium compression at 34deg   TODAY'S TREATMENT:                                                                                                       DATE: 04/28/2024 TherEx:  [place]ley Rt flexion with arm extended 3 mins with 2-3 sec holds , scaption 3 mins with 2-3 sec holds  Supine wand AAROM flexion 2 x 10 with 2-3 sec hold at end range  Neuro Re-ed (muscle activation, scapular control) Supine AAROM bilateral 1 lb bar protraction 3 sec hold x 10  Seated scapular retraction 5 sec  hold x 10   Manual:  Rt shoulder inferior glides g3, posterior glide mobilization c movement c passive ER.    Vaso:  Seated Rt shoulder with arm supported for 10 minutes  with medium compression at 34deg   TODAY'S TREATMENT:                                                                                                       DATE: 04/23/2024 TherEx:  Supine wand AAROM flexion 2 x 10 with 2-3 sec hold at end range Supine wand AAROM ER stretch in 45 deg 2-3 sec hold x 10  Seated scapular retraction 5 sec hold x 10     Manual:  Rt shoulder inferior glides g3, posterior glide mobilization c movement c passive ER.   Vaso:  Seated Rt shoulder with arm supported for 10 minutes with medium compression at 34deg   TODAY'S TREATMENT:                                                                                                       DATE: 04/21/2024 TherEx:  Rt UE pendulums, fwd/back, lateral, and CW/CCW circles; performed at beginning of session and end  Supine AROM ER from 0-30deg x20; PT measuring out 30deg for appropriate AROM to follow protocol Supine bicep curls AROM-AAROM ; supinated hand 1x10 with 3s stretch into elbow extension; neutral grip 1x10 with 3s stretch into elbow extension  Rt tricep extension AROM with PT stabilizing humerus 1x12 Supine scapular retractions 1x10 with 2s hold; intermittent verbal cues for appropriate technique to decrease shoulder shrug  Supine scapular depression 1x10 with 2s hold ; demonstration provided with appropriate carryover from patient  Supine cervical retraction 1x10 with 2s hold  Seated UT stretch 1x30s each side   Manual:  Rt shoulder abduction and flexion PROM 0-80deg with grade 2 inferior glide in supine   Vaso:  Seated Rt shoulder with arm supported for 10 minutes with medium compression at 34deg   TODAY'S TREATMENT:                                                                                                       DATE: 04/15/2024 Therapeutic Exercise: RUE Pendulum starting small and progress to slight  stretch: 10 reps each flex/ext, abd/add and circles CW/CCW. A set each At beginning and end of session.    RUE Biceps curls with soft elbow ext stretch 10 reps AA seated, 10 reps supine with palm up, 10 reps supine with palm neutral.  RUE triceps ext AROM with PT stabilizing humerus 10 reps.  Cervical retraction supine 10 reps with tactile cues for technique. BUE Scapular retraction (focus on limiting shoulder elevation) supine 10 reps with tactile cues for technique. BUE scapular depression supine 10 reps.  PT reviewed HEP and positioning while patient on vaso  Manual  Rt shoulder g2 inferior jt mobs for flexion and abduction to 80*, distraction with PROM in to elevation to tolerance. - done in supine.   Vaso right shoulder seated with arm supported 34* medium compression 10 min.     PATIENT EDUCATION: 04/30/2024 Education details: HEP update Person educated: Patient Education method: Programmer, multimedia, Demonstration, Verbal cues, and Handouts Education comprehension: verbalized understanding, returned demonstration, and verbal cues required  HOME EXERCISE PROGRAM: Access Code: [email or web address] URL: [email or web address] Date: 04/30/2024 Prepared by: Ozell Silvan  Exercises - Seated Shoulder Flexion Towel Slide at Table Top  - 2 x daily - 7 x weekly - 2-3 sets - 10 reps - 5 hold - Seated Scapular Retraction  - 3-5 x daily - 7 x weekly - 1 sets - 10 reps - 3-5 hold - Supine Shoulder Flexion Extension AAROM with Dowel  - 2-3 x daily - 7 x weekly - 1-2 sets - 10-15 reps - 3 hold - Supine Shoulder External Rotation with Dowel at 20 Degrees of Abduction  - 2-3 x daily - 7 x weekly - 1 sets - 10 reps - 5 hold - Standing Isometric Shoulder Flexion with Doorway - Arm Bent  - 1-2 x daily - 7 x weekly - 1 sets - 10 reps - 5-10 hold - Standing Isometric Shoulder Abduction with Doorway - Arm Bent (Mirrored)  - 1-2 x daily - 7 x weekly - 1 sets - 10 reps - 5-10 hold   ASSESSMENT:  CLINICAL IMPRESSION:  Active range in supine was performed with fair to good movement today.  Continued skilled  PT services indicated. Updated HEP now that 5 weeks post op past , included isometrics with focus on no pain increase.   OBJECTIVE IMPAIRMENTS: decreased activity tolerance, decreased coordination, decreased endurance, decreased mobility, decreased ROM, decreased strength, hypomobility, increased edema, increased fascial restrictions, impaired perceived functional ability, increased muscle spasms, impaired flexibility, impaired UE functional use, improper body mechanics, postural dysfunction, and pain.   ACTIVITY LIMITATIONS: carrying, lifting, sleeping, transfers, bed mobility, bathing, toileting, dressing, self feeding, reach over head, hygiene/grooming, and caring for others  PARTICIPATION LIMITATIONS: meal prep, cleaning, laundry, interpersonal relationship, driving, shopping, community activity, occupation, and yard work  PERSONAL FACTORS: No specific factors are affecting patient's functional outcome.   REHAB POTENTIAL: Good  CLINICAL DECISION MAKING: Stable/uncomplicated  EVALUATION COMPLEXITY: Low   GOALS: Goals reviewed with patient? Yes  SHORT TERM GOALS: (target date for Short term goals are 3 weeks 04/27/2024)  1.Patient will demonstrate independent use of home exercise program to maintain progress from in clinic treatments. Goal status:  Ongoing     04/15/2024  LONG TERM GOALS: (target dates for all long term goals are 10 weeks  06/15/2024 )   1. Patient will demonstrate/report pain at worst less than or equal to 2/10 to facilitate minimal limitation in daily activity secondary to pain symptoms. Goal status: Ongoing  04/15/2024   2. Patient will demonstrate independent use of home exercise program to facilitate ability to maintain/progress functional gains from skilled physical therapy services. Goal status:  Ongoing     04/15/2024   3. Patient will demonstrate Patient specific functional scale avg > or = 8/10 to indicate reduced disability due to condition.  Goal status:   Ongoing   04/15/2024   4.  Patient will demonstrate Rt UE MMT 5/5 throughout to facilitate lifting, reaching, carrying at [facility] in daily activity.   Goal status:  Ongoing   04/15/2024   5.  Patient will demonstrate Rt GH joint AROM WFL s symptoms to facilitate usual overhead reaching, self care, dressing at PLOF.    Goal status:  Ongoing  04/15/2024   6.  Patient will demonstrate/report ability to return to work at [employer].  Goal status: Ongoing   04/15/2024   PLAN:  PT FREQUENCY: 1-2x/week  PT DURATION: 10 weeks  PLANNED INTERVENTIONS: Can include 02853- PT Re-evaluation, 97110-Therapeutic exercises, 97530- Therapeutic activity, 97112- Neuromuscular re-education, 97535- Self Care, 97140- Manual therapy, [phone]- Gait training, [phone]- Orthotic Fit/training, [phone]- Canalith repositioning, [id number]- Aquatic Therapy, [phone]- Electrical stimulation (unattended), [id number] Physical performance testing, 97016- Vasopneumatic device, [id number]- Ultrasound, [id number]- Traction (mechanical), [id number]- Ionotophoresis 4mg /ml Dexamethasone ,  [id number] - Needle insertion w/o injection 1 or 2 muscles, 20561 - Needle insertion w/o injection 3 or more muscles.   Patient/Family education, Balance training, Stair training, Taping, Dry Needling, Joint mobilization, Joint manipulation, Spinal manipulation, Spinal mobilization, Scar mobilization, Vestibular training, Visual/preceptual remediation/compensation, DME instructions, Cryotherapy, and Moist heat.  All performed as medically necessary.  All included unless contraindicated  PLAN FOR NEXT SESSION:Now 5+ weeks post surgery.  AAROM, AROM, isometric continuation.     Return to MD scheduled for 05/06/2024.   Wearing sling for 5 weeks past 04/01/2024 PA visit (May 06, 2024)   Ozell Silvan, PT, DPT, OCS, ATC 04/30/24  1:44 PM

## 2024-05-05 ENCOUNTER — Encounter: Admitting: Rehabilitative and Restorative Service Providers"

## 2024-05-06 ENCOUNTER — Ambulatory Visit (INDEPENDENT_AMBULATORY_CARE_PROVIDER_SITE_OTHER): Admitting: Physician Assistant

## 2024-05-06 DIAGNOSIS — Z9889 Other specified postprocedural states: Secondary | ICD-10-CM

## 2024-05-06 NOTE — Progress Notes (Signed)
 Post-Op Visit Note   Patient: Christina Cannon           Date of Birth: Apr 12, 1966           MRN: [id number] Visit Date: 05/06/2024 PCP: Vicci Barnie NOVAK, MD   Assessment & Plan:  Chief Complaint:  Chief Complaint  Patient presents with   Right Shoulder - Follow-up    Right shoulder scope 03/25/2024   Visit Diagnoses:  1. S/P rotator cuff repair     Plan: Patient is a pleasant 58 year old female who comes in today approximately 6 weeks status post right shoulder arthroscopic rotator cuff repair 03/25/2024.  She is doing well.  She has been compliant wearing her sling.  She has been in physical therapy making good progress.  She does get some pain which primarily only occurs at night.  She is not taking anything for this as the Norco does not seem to help.  Overall, feeling well.  Examination of her right shoulder reveals forward flexion to 150 degrees.  Internal rotation to her back pocket.  External rotation to 50 degrees, abduction to 110 degrees.  She is neurovascularly intact distally.  At this point, she may discontinue her sling.  She will continue in physical therapy.  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  Work note provided.  Follow-up in 6 weeks for recheck.  Call with concerns or questions.  Follow-Up Instructions: Return in about 6 weeks (around 06/17/2024).   Orders:  No orders of the defined types were placed in this encounter.  No orders of the defined types were placed in this encounter.   Imaging: No new imaging  PMFS History: Patient Active Problem List   Diagnosis Date Noted   Arthritis of right acromioclavicular joint 03/25/2024   Impingement syndrome of right shoulder 03/25/2024   Traumatic complete tear of right rotator cuff 02/11/2024   Vitamin D  deficiency, unspecified 05/28/2023   Prediabetes 05/28/2023   Polyp of colon 07/19/2022    Prolonged QT interval 12/21/2021   Colitis 12/21/2021   Diverticulitis 12/20/2021   Hypokalemia 12/20/2021   Obesity (BMI 30-39.9) 12/20/2021   Tobacco use 12/20/2021   History of colonic diverticulitis 07/09/2017   Primary osteoarthritis of both knees 07/09/2017   Primary osteoarthritis of both ankles 07/09/2017   Tobacco dependence 07/09/2017   Elevated blood pressure reading 07/09/2017   Leukocytosis 06/10/2017   Skin tag of vulva 08/10/2015   Past Medical History:  Diagnosis Date   Diverticulitis    Hypertension    Pre-diabetes     Family History  Problem Relation Age of Onset   Diabetes Mother    Hypertension Mother    Diabetes Father    Hypertension Father    Heart failure Father    Multiple sclerosis Sister    Colon cancer Neg Hx    Colon polyps Neg Hx    Esophageal cancer Neg Hx    Rectal cancer Neg Hx    Stomach cancer Neg Hx     Past Surgical History:  Procedure Laterality Date   BIOPSY  07/19/2022   Procedure: BIOPSY;  Surgeon: Stacia Glendia BRAVO, MD;  Location: [facility];  Service: Gastroenterology;;   CHOLECYSTECTOMY  1988   COLONOSCOPY WITH PROPOFOL  N/A 07/19/2022   Procedure: COLONOSCOPY WITH PROPOFOL ;  Surgeon: Stacia Glendia BRAVO, MD;  Location: [facility];  Service: Gastroenterology;  Laterality: N/A;   HAND  SURGERY  1997   POLYPECTOMY N/A 07/19/2022   Procedure: POLYPECTOMY;  Surgeon: Stacia Glendia BRAVO, MD;  Location: [facility];  Service: Gastroenterology;  Laterality: N/A;   SHOULDER ARTHROSCOPY WITH ROTATOR CUFF REPAIR Right 03/25/2024   Procedure: RIGHT SHOULDER ARTHROSCOPY, ROTATOR CUFF REPAIR;  Surgeon: Jerri Kay HERO, MD;  Location: [facility];  Service: Orthopedics;  Laterality: Right;   SUBACROMIAL DECOMPRESSION Right 03/25/2024   Procedure: SUBACROMIAL DECOMPRESSION, EXTENSIVE DEBRIDEMENT;  Surgeon: Jerri Kay HERO, MD;  Location: [facility];  Service: Orthopedics;  Laterality: Right;   Social History    Occupational History   Not on file  Tobacco Use   Smoking status: Some Days    Types: Cigarettes    Passive exposure: Current   Smokeless tobacco: Never   Tobacco comments:    Former occasional smoker started around age 69,   Vaping Use   Vaping status: Never Used  Substance and Sexual Activity   Alcohol use: Not Currently   Drug use: Yes    Types: Marijuana    Comment: last used 9/1/-25   Sexual activity: Yes    Partners: Male    Birth control/protection: None, Post-menopausal    Comment: menarche 58yo, sexual debut 58yo

## 2024-05-06 NOTE — Therapy (Signed)
 OUTPATIENT PHYSICAL THERAPY  TREATMENT   Patient Name: Christina Cannon MRN: [id number] DOB:1966-04-14, 58 y.o., female Today's Date: 05/07/2024  END OF SESSION:  PT End of Session - 05/07/24 1304     Visit Number 8    Number of Visits 20    Date for Recertification  06/15/24    Authorization Type [facility] [employer] $25 copay    PT Start Time 1302    PT Stop Time 1352    PT Time Calculation (min) 50 min    Activity Tolerance Patient tolerated treatment well    Behavior During Therapy [facility] for tasks assessed/performed            Past Medical History:  Diagnosis Date   Diverticulitis    Hypertension    Pre-diabetes    Past Surgical History:  Procedure Laterality Date   BIOPSY  07/19/2022   Procedure: BIOPSY;  Surgeon: Stacia Glendia BRAVO, MD;  Location: [facility];  Service: Gastroenterology;;   CHOLECYSTECTOMY  1988   COLONOSCOPY WITH PROPOFOL  N/A 07/19/2022   Procedure: COLONOSCOPY WITH PROPOFOL ;  Surgeon: Stacia Glendia BRAVO, MD;  Location: [facility];  Service: Gastroenterology;  Laterality: N/A;   HAND SURGERY  1997   POLYPECTOMY N/A 07/19/2022   Procedure: POLYPECTOMY;  Surgeon: Stacia Glendia BRAVO, MD;  Location: [facility];  Service: Gastroenterology;  Laterality: N/A;   SHOULDER ARTHROSCOPY WITH ROTATOR CUFF REPAIR Right 03/25/2024   Procedure: RIGHT SHOULDER ARTHROSCOPY, ROTATOR CUFF REPAIR;  Surgeon: Jerri Kay HERO, MD;  Location: [facility];  Service: Orthopedics;  Laterality: Right;   SUBACROMIAL DECOMPRESSION Right 03/25/2024   Procedure: SUBACROMIAL DECOMPRESSION, EXTENSIVE DEBRIDEMENT;  Surgeon: Jerri Kay HERO, MD;  Location: [facility];  Service: Orthopedics;  Laterality: Right;   Patient Active Problem List   Diagnosis Date Noted   Arthritis of right acromioclavicular joint 03/25/2024   Impingement syndrome of right shoulder 03/25/2024   Traumatic complete tear of right rotator cuff 02/11/2024   Vitamin D  deficiency,  unspecified 05/28/2023   Prediabetes 05/28/2023   Polyp of colon 07/19/2022   Prolonged QT interval 12/21/2021   Colitis 12/21/2021   Diverticulitis 12/20/2021   Hypokalemia 12/20/2021   Obesity (BMI 30-39.9) 12/20/2021   Tobacco use 12/20/2021   History of colonic diverticulitis 07/09/2017   Primary osteoarthritis of both knees 07/09/2017   Primary osteoarthritis of both ankles 07/09/2017   Tobacco dependence 07/09/2017   Elevated blood pressure reading 07/09/2017   Leukocytosis 06/10/2017   Skin tag of vulva 08/10/2015    PCP: Vicci Barnie WENDI BARTON PROVIDER: Jule Ronal CROME, PA-C  REFERRING DIAG: [phone] (ICD-10-CM) - S/P rotator cuff repair  THERAPY DIAG:  Chronic right shoulder pain  Muscle weakness (generalized)  Localized edema  Abnormal posture  Rationale for Evaluation and Treatment: Rehabilitation  ONSET DATE: Surgery 03/25/2024  SUBJECTIVE:  SUBJECTIVE STATEMENT: Patient noted improved sleep as she slept in her bed without the sling for the first time. Patient has been cleared to wean from sling.    Rt hand dominant.    PERTINENT HISTORY: S/p right shoulder rotator cuff repair on 03/25/2024  Wearing sling for 5 weeks past 04/01/2024 PA visit   PAIN:  NPRS scale: 4/10 soreness, not pain Pain location: Rt shoulder/upper arm Pain description: sharp, dull, achy Aggravating factors: nighttime, any arm movement.  Relieving factors: medicine, resting it   PRECAUTIONS: Shoulder :  Rotator cuff repair.  Wearing sling for 5 weeks past 04/01/2024 PA visit   WEIGHT BEARING RESTRICTIONS: Yes Rt shoulder  FALLS:  Has patient fallen in last 6 months? 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  Plan to  get back to work.   PLOF: Independent, recreational softball, basketball, soccer at times.  Cooking, yard work/gardening.   PATIENT GOALS:Reduce pain , get back to work and activity.   OBJECTIVE:   PATIENT SURVEYS:  Patient-Specific Activity Scoring Scheme  0 represents "unable to perform." 10 represents "able to perform at prior level. 0 1 2 3 4 5 6 7 8 9  10 (Date and Score)   Activity Eval  04/06/2024    1. Bathing   5    2. Comb hair  2    3. Cook 2   4. sweep 0   5. sleep 4   Score 2.6 avg    Total score = sum of the activity scores/number of activities Minimum detectable change (90%CI) for average score = 2 points Minimum detectable change (90%CI) for single activity score = 3 points  COGNITION: 04/06/2024 Overall cognitive status: WFL     SENSATION: 04/06/2024 No specific testing today.   POSTURE: 04/06/2024 Wearing sling, rounded shoulders noted in sitting.   UPPER EXTREMITY ROM:   ROM Right Eval 04/06/2024 PROM Limited by pian, muscle guarding Right 04/23/2024  PROM in supine Right 04/28/2024  Shoulder flexion 100 130 150 AAROM with wand in supine   Shoulder extension     Shoulder abduction 80 110   Shoulder adduction     Shoulder internal rotation  65 in 45 deg abduction    Shoulder external rotation 0 with arm in 20 deg abduction 65 in 45 deg abduction   Elbow flexion     Elbow extension     Wrist flexion     Wrist extension     Wrist ulnar deviation     Wrist radial deviation     Wrist pronation     Wrist supination     (Blank rows = not tested)  UPPER EXTREMITY MMT:  MMT Right Eval 04/06/2024  No testing due to surgery Left Eval 04/06/2024  Shoulder flexion  5/5  Shoulder extension    Shoulder abduction  5/5  Shoulder adduction    Shoulder internal rotation  5/5  Shoulder external rotation  5/5  Middle trapezius    Lower trapezius    Elbow flexion    Elbow extension    Wrist flexion    Wrist extension    Wrist ulnar  deviation    Wrist radial deviation    Wrist pronation    Wrist supination    Grip strength (lbs)    (Blank rows = not tested)  SHOULDER SPECIAL TESTS: 04/06/2024 No specific testing   JOINT MOBILITY TESTING:  04/06/2024 Mid range inferior Rt GH joint passive accessory motion  WFL.   PALPATION:  04/06/2024 General tenderness around incision, Rt upper arm.                                                                                                                                                                                                  TODAY'S TREATMENT:                                                                                                       DATE: 05/07/2024 TherEx:  Pulley into shoulder flexion 3 minutes with 3s holds, shoulder scaption 3 minutes with 3s holds  Standing shoulder flexion isometrics 1x15 with 5s hold  Standing shoulder abduction isometrics 1x12 with 5s hold  Supine AAROM shoulder flexion with PVC 2x15 with 2-3s hold  Supine AROM shoulder abduction 2x10   Vaso:  Seated Rt shoulder with arm supported for 10 minutes with medium compression at 34deg    TODAY'S TREATMENT:                                                                                                       DATE: 04/30/2024 TherEx:  Pulley Rt flexion with arm extended 3 mins with 2-3 sec holds , scaption 3 mins with 2-3 sec holds  Supine wand AAROM flexion x 15  with 2-3 sec hold at end range Supine AROM flexion x 10  Supine wand AAROM ER in 45 deg abduction 5 sec hold x 10   HEP update with handout provided.    Neuro Re-ed (muscle activation, scapular control) Standing isometric submax, non pain increasing amount 5 sec on /off x 12 each (added to HEP).  Extended education on focus on performing at level of not pain increasing.    Vaso:  Seated  Rt shoulder with arm supported for 10 minutes with medium compression at 34deg   TODAY'S TREATMENT:                                                                                                        DATE: 04/28/2024 TherEx:  Pulley Rt flexion with arm extended 3 mins with 2-3 sec holds , scaption 3 mins with 2-3 sec holds  Supine wand AAROM flexion 2 x 10 with 2-3 sec hold at end range  Neuro Re-ed (muscle activation, scapular control) Supine AAROM bilateral 1 lb bar protraction 3 sec hold x 10  Seated scapular retraction 5 sec hold x 10   Manual:  Rt shoulder inferior glides g3, posterior glide mobilization c movement c passive ER.    Vaso:  Seated Rt shoulder with arm supported for 10 minutes with medium compression at 34deg   TODAY'S TREATMENT:                                                                                                       DATE: 04/23/2024 TherEx:  Supine wand AAROM flexion 2 x 10 with 2-3 sec hold at end range Supine wand AAROM ER stretch in 45 deg 2-3 sec hold x 10  Seated scapular retraction 5 sec hold x 10     Manual:  Rt shoulder inferior glides g3, posterior glide mobilization c movement c passive ER.   Vaso:  Seated Rt shoulder with arm supported for 10 minutes with medium compression at 34deg   TODAY'S TREATMENT:                                                                                                       DATE: 04/21/2024 TherEx:  Rt UE pendulums, fwd/back, lateral, and CW/CCW circles; performed at beginning of session and end  Supine AROM ER from 0-30deg x20; PT measuring out 30deg for appropriate AROM to follow protocol Supine bicep curls AROM-AAROM ; supinated hand 1x10 with 3s stretch into elbow extension; neutral grip 1x10 with 3s stretch into elbow extension  Rt tricep extension AROM with PT stabilizing humerus 1x12 Supine scapular retractions 1x10 with 2s hold; intermittent verbal cues for appropriate technique to decrease shoulder shrug  Supine  scapular depression 1x10 with 2s hold ; demonstration provided with appropriate carryover from patient  Supine  cervical retraction 1x10 with 2s hold  Seated UT stretch 1x30s each side   Manual:  Rt shoulder abduction and flexion PROM 0-80deg with grade 2 inferior glide in supine   Vaso:  Seated Rt shoulder with arm supported for 10 minutes with medium compression at 34deg   TODAY'S TREATMENT:                                                                                                       DATE: 04/15/2024 Therapeutic Exercise: RUE Pendulum starting small and progress to slight stretch: 10 reps each flex/ext, abd/add and circles CW/CCW. A set each At beginning and end of session.   RUE Biceps curls with soft elbow ext stretch 10 reps AA seated, 10 reps supine with palm up, 10 reps supine with palm neutral.  RUE triceps ext AROM with PT stabilizing humerus 10 reps.  Cervical retraction supine 10 reps with tactile cues for technique. BUE Scapular retraction (focus on limiting shoulder elevation) supine 10 reps with tactile cues for technique. BUE scapular depression supine 10 reps.  PT reviewed HEP and positioning while patient on vaso  Manual  Rt shoulder g2 inferior jt mobs for flexion and abduction to 80*, distraction with PROM in to elevation to tolerance. - done in supine.   Vaso right shoulder seated with arm supported 34* medium compression 10 min.     PATIENT EDUCATION: 04/30/2024 Education details: HEP update Person educated: Patient Education method: Programmer, multimedia, Demonstration, Verbal cues, and Handouts Education comprehension: verbalized understanding, returned demonstration, and verbal cues required  HOME EXERCISE PROGRAM: Access Code: [email or web address] URL: [email or web address] Date: 04/30/2024 Prepared by: Ozell Silvan  Exercises - Seated Shoulder Flexion Towel Slide at Table Top  - 2 x daily - 7 x weekly - 2-3 sets - 10 reps - 5 hold - Seated Scapular Retraction  - 3-5 x daily - 7 x weekly - 1 sets - 10 reps - 3-5 hold - Supine Shoulder Flexion Extension AAROM  with Dowel  - 2-3 x daily - 7 x weekly - 1-2 sets - 10-15 reps - 3 hold - Supine Shoulder External Rotation with Dowel at 20 Degrees of Abduction  - 2-3 x daily - 7 x weekly - 1 sets - 10 reps - 5 hold - Standing Isometric Shoulder Flexion with Doorway - Arm Bent  - 1-2 x daily - 7 x weekly - 1 sets - 10 reps - 5-10 hold - Standing Isometric Shoulder Abduction with Doorway - Arm Bent (Mirrored)  - 1-2 x daily - 7 x weekly - 1 sets - 10 reps - 5-10 hold   ASSESSMENT:  CLINICAL IMPRESSION:  Patient arrived to session following MD appointment yesterday with clearance to wean from sling and precautions to not lift anything with arm. Patient had slight soreness and muscle fatigue with activities this date, but is improving with AROM. Patient will continue to benefit from skilled PT.   OBJECTIVE IMPAIRMENTS: decreased activity tolerance, decreased coordination, decreased endurance, decreased  mobility, decreased ROM, decreased strength, hypomobility, increased edema, increased fascial restrictions, impaired perceived functional ability, increased muscle spasms, impaired flexibility, impaired UE functional use, improper body mechanics, postural dysfunction, and pain.   ACTIVITY LIMITATIONS: carrying, lifting, sleeping, transfers, bed mobility, bathing, toileting, dressing, self feeding, reach over head, hygiene/grooming, and caring for others  PARTICIPATION LIMITATIONS: meal prep, cleaning, laundry, interpersonal relationship, driving, shopping, community activity, occupation, and yard work  PERSONAL FACTORS: No specific factors are affecting patient's functional outcome.   REHAB POTENTIAL: Good  CLINICAL DECISION MAKING: Stable/uncomplicated  EVALUATION COMPLEXITY: Low   GOALS: Goals reviewed with patient? Yes  SHORT TERM GOALS: (target date for Short term goals are 3 weeks 04/27/2024)  1.Patient will demonstrate independent use of home exercise program to maintain progress from in clinic  treatments. Goal status:  Ongoing     04/15/2024  LONG TERM GOALS: (target dates for all long term goals are 10 weeks  06/15/2024 )   1. Patient will demonstrate/report pain at worst less than or equal to 2/10 to facilitate minimal limitation in daily activity secondary to pain symptoms. Goal status: Ongoing    04/15/2024   2. Patient will demonstrate independent use of home exercise program to facilitate ability to maintain/progress functional gains from skilled physical therapy services. Goal status:  Ongoing     04/15/2024   3. Patient will demonstrate Patient specific functional scale avg > or = 8/10 to indicate reduced disability due to condition.  Goal status:  Ongoing   04/15/2024   4.  Patient will demonstrate Rt UE MMT 5/5 throughout to facilitate lifting, reaching, carrying at [facility] in daily activity.   Goal status:  Ongoing   04/15/2024   5.  Patient will demonstrate Rt GH joint AROM WFL s symptoms to facilitate usual overhead reaching, self care, dressing at PLOF.    Goal status:  Ongoing  04/15/2024   6.  Patient will demonstrate/report ability to return to work at [employer].  Goal status: Ongoing   04/15/2024   PLAN:  PT FREQUENCY: 1-2x/week  PT DURATION: 10 weeks  PLANNED INTERVENTIONS: Can include 02853- PT Re-evaluation, 97110-Therapeutic exercises, 97530- Therapeutic activity, 97112- Neuromuscular re-education, 97535- Self Care, 97140- Manual therapy, [phone]- Gait training, [phone]- Orthotic Fit/training, [phone]- Canalith repositioning, [id number]- Aquatic Therapy, [phone]- Electrical stimulation (unattended), [id number] Physical performance testing, [id number]- Vasopneumatic device, [id number]- Ultrasound, [id number]- Traction (mechanical), [id number]- Ionotophoresis 4mg /ml Dexamethasone ,  [id number] - Needle insertion w/o injection 1 or 2 muscles, 20561 - Needle insertion w/o injection 3 or more muscles.   Patient/Family education, Balance training, Stair training, Taping, Dry Needling, Joint mobilization, Joint  manipulation, Spinal manipulation, Spinal mobilization, Scar mobilization, Vestibular training, Visual/preceptual remediation/compensation, DME instructions, Cryotherapy, and Moist heat.  All performed as medically necessary.  All included unless contraindicated  PLAN FOR NEXT SESSION:  Now 5+ weeks post surgery.  AAROM, AROM, isometric continuation.  Has started weaning from sling as of 05/06/2024   To return to MD in 6 weeks (around 06/17/2024)   Susannah Daring, PT, DPT 05/07/24 3:18 PM

## 2024-05-07 ENCOUNTER — Ambulatory Visit (INDEPENDENT_AMBULATORY_CARE_PROVIDER_SITE_OTHER)

## 2024-05-07 DIAGNOSIS — R600 Localized edema: Secondary | ICD-10-CM | POA: Diagnosis not present

## 2024-05-07 DIAGNOSIS — R293 Abnormal posture: Secondary | ICD-10-CM | POA: Diagnosis not present

## 2024-05-07 DIAGNOSIS — G8929 Other chronic pain: Secondary | ICD-10-CM | POA: Diagnosis not present

## 2024-05-07 DIAGNOSIS — M6281 Muscle weakness (generalized): Secondary | ICD-10-CM

## 2024-05-07 DIAGNOSIS — M25511 Pain in right shoulder: Secondary | ICD-10-CM | POA: Diagnosis not present

## 2024-05-11 NOTE — Therapy (Signed)
 OUTPATIENT PHYSICAL THERAPY  TREATMENT   Patient Name: Christina Cannon MRN: [id number] DOB:[birth year], 58 y.o., female Today's Date: 05/12/2024  END OF SESSION:  PT End of Session - 05/12/24 1527     Visit Number 9    Number of Visits 20    Date for Recertification  06/15/24    Authorization Type [facility] [employer] $25 copay    PT Start Time 1515    PT Stop Time 1603    PT Time Calculation (min) 48 min    Activity Tolerance Patient tolerated treatment well    Behavior During Therapy [facility] for tasks assessed/performed             Past Medical History:  Diagnosis Date   Diverticulitis    Hypertension    Pre-diabetes    Past Surgical History:  Procedure Laterality Date   BIOPSY  07/19/2022   Procedure: BIOPSY;  Surgeon: Stacia Glendia BRAVO, MD;  Location: [facility];  Service: Gastroenterology;;   CHOLECYSTECTOMY  1988   COLONOSCOPY WITH PROPOFOL  N/A 07/19/2022   Procedure: COLONOSCOPY WITH PROPOFOL ;  Surgeon: Stacia Glendia BRAVO, MD;  Location: [facility];  Service: Gastroenterology;  Laterality: N/A;   HAND SURGERY  1997   POLYPECTOMY N/A 07/19/2022   Procedure: POLYPECTOMY;  Surgeon: Stacia Glendia BRAVO, MD;  Location: [facility];  Service: Gastroenterology;  Laterality: N/A;   SHOULDER ARTHROSCOPY WITH ROTATOR CUFF REPAIR Right 03/25/2024   Procedure: RIGHT SHOULDER ARTHROSCOPY, ROTATOR CUFF REPAIR;  Surgeon: Jerri Kay HERO, MD;  Location: [facility];  Service: Orthopedics;  Laterality: Right;   SUBACROMIAL DECOMPRESSION Right 03/25/2024   Procedure: SUBACROMIAL DECOMPRESSION, EXTENSIVE DEBRIDEMENT;  Surgeon: Jerri Kay HERO, MD;  Location: [facility];  Service: Orthopedics;  Laterality: Right;   Patient Active Problem List   Diagnosis Date Noted   Arthritis of right acromioclavicular joint 03/25/2024   Impingement syndrome of right shoulder 03/25/2024   Traumatic complete tear of right rotator cuff 02/11/2024   Vitamin D  deficiency,  unspecified 05/28/2023   Prediabetes 05/28/2023   Polyp of colon 07/19/2022   Prolonged QT interval 12/21/2021   Colitis 12/21/2021   Diverticulitis 12/20/2021   Hypokalemia 12/20/2021   Obesity (BMI 30-39.9) 12/20/2021   Tobacco use 12/20/2021   History of colonic diverticulitis 07/09/2017   Primary osteoarthritis of both knees 07/09/2017   Primary osteoarthritis of both ankles 07/09/2017   Tobacco dependence 07/09/2017   Elevated blood pressure reading 07/09/2017   Leukocytosis 06/10/2017   Skin tag of vulva 08/10/2015    PCP: Vicci Barnie WENDI BARTON PROVIDER: Jule Ronal CROME, PA-C  REFERRING DIAG: [phone] (ICD-10-CM) - S/P rotator cuff repair  THERAPY DIAG:  Chronic right shoulder pain  Muscle weakness (generalized)  Localized edema  Abnormal posture  Rationale for Evaluation and Treatment: Rehabilitation  ONSET DATE: Surgery 03/25/2024  SUBJECTIVE:  SUBJECTIVE STATEMENT: Pt reports soreness but not pain.  Has been out of sling since last visit.   Rt hand dominant.    PERTINENT HISTORY: S/p right shoulder rotator cuff repair on 03/25/2024  Wearing sling for 5 weeks past 04/01/2024 PA visit   PAIN:  NPRS scale: 4/10 soreness, not pain Pain location: Rt shoulder/upper arm Pain description: sharp, dull, achy Aggravating factors: nighttime, any arm movement.  Relieving factors: medicine, resting it   PRECAUTIONS: Shoulder :  Rotator cuff repair.  Wearing sling for 5 weeks past 04/01/2024 PA visit   WEIGHT BEARING RESTRICTIONS: Yes Rt shoulder  FALLS:  Has patient fallen in last 6 months? 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  Plan to get back to work.   PLOF: Independent, recreational softball,  basketball, soccer at times.  Cooking, yard work/gardening.   PATIENT GOALS:Reduce pain , get back to work and activity.   OBJECTIVE:   PATIENT SURVEYS:  Patient-Specific Activity Scoring Scheme  0 represents "unable to perform." 10 represents "able to perform at prior level. 0 1 2 3 4 5 6 7 8 9  10 (Date and Score)   Activity Eval  04/06/2024    1. Bathing   5    2. Comb hair  2    3. Cook 2   4. sweep 0   5. sleep 4   Score 2.6 avg    Total score = sum of the activity scores/number of activities Minimum detectable change (90%CI) for average score = 2 points Minimum detectable change (90%CI) for single activity score = 3 points  COGNITION: 04/06/2024 Overall cognitive status: WFL     SENSATION: 04/06/2024 No specific testing today.   POSTURE: 04/06/2024 Wearing sling, rounded shoulders noted in sitting.   UPPER EXTREMITY ROM:   ROM Right Eval 04/06/2024 PROM Limited by pian, muscle guarding Right 04/23/2024  PROM in supine Right 04/28/2024  Shoulder flexion 100 130 150 AAROM with wand in supine   Shoulder extension     Shoulder abduction 80 110   Shoulder adduction     Shoulder internal rotation  65 in 45 deg abduction    Shoulder external rotation 0 with arm in 20 deg abduction 65 in 45 deg abduction   Elbow flexion     Elbow extension     Wrist flexion     Wrist extension     Wrist ulnar deviation     Wrist radial deviation     Wrist pronation     Wrist supination     (Blank rows = not tested)  UPPER EXTREMITY MMT:  MMT Right Eval 04/06/2024  No testing due to surgery Left Eval 04/06/2024  Shoulder flexion  5/5  Shoulder extension    Shoulder abduction  5/5  Shoulder adduction    Shoulder internal rotation  5/5  Shoulder external rotation  5/5  Middle trapezius    Lower trapezius    Elbow flexion    Elbow extension    Wrist flexion    Wrist extension    Wrist ulnar deviation    Wrist radial deviation    Wrist pronation    Wrist  supination    Grip strength (lbs)    (Blank rows = not tested)  SHOULDER SPECIAL TESTS: 04/06/2024 No specific testing   JOINT MOBILITY TESTING:  04/06/2024 Mid range inferior Rt GH joint passive accessory motion WFL.   PALPATION:  04/06/2024 General tenderness around incision, Rt  upper arm.                                                                                                                                                                                                  TODAY'S TREATMENT:                                                                                                        DATE: 05/12/24 TherEx:  Pulley into shoulder flexion 3 minutes with 3s holds, shoulder scaption 3 minutes with 3s holds  Standing shoulder flexion isometrics 1x15 with 5s hold  Standing shoulder abduction isometrics 1x12 with 5s hold  Supine AAROM shoulder flexion with PVC 2x15 with 2-3s hold  Supine AROM shoulder abduction 2x10  Manual - scap glides  Vaso:  Seated Rt shoulder with arm supported for 10 minutes with medium compression at 34deg     DATE: 05/07/2024 TherEx:  Pulley into shoulder flexion 3 minutes with 3s holds, shoulder scaption 3 minutes with 3s holds  Standing shoulder flexion isometrics 1x15 with 5s hold  Standing shoulder abduction isometrics 1x12 with 5s hold  Supine AAROM shoulder flexion with PVC 2x15 with 2-3s hold  Supine AROM shoulder abduction 2x10   Vaso:  Seated Rt shoulder with arm supported for 10 minutes with medium compression at 34deg    TODAY'S TREATMENT:                                                                                                       DATE: 04/30/2024 TherEx:  Pulley Rt flexion with arm extended 3 mins with 2-3 sec holds , scaption 3 mins with 2-3 sec holds  Supine wand AAROM flexion x 15  with 2-3 sec hold at end range  Supine AROM flexion x 10  Supine wand AAROM ER in 45 deg abduction 5 sec hold x 10   HEP update with  handout provided.    Neuro Re-ed (muscle activation, scapular control) Standing isometric submax, non pain increasing amount 5 sec on /off x 12 each (added to HEP).  Extended education on focus on performing at level of not pain increasing.    Vaso:  Seated Rt shoulder with arm supported for 10 minutes with medium compression at 34deg   TODAY'S TREATMENT:                                                                                                       DATE: 04/28/2024 TherEx:  Pulley Rt flexion with arm extended 3 mins with 2-3 sec holds , scaption 3 mins with 2-3 sec holds  Supine wand AAROM flexion 2 x 10 with 2-3 sec hold at end range  Neuro Re-ed (muscle activation, scapular control) Supine AAROM bilateral 1 lb bar protraction 3 sec hold x 10  Seated scapular retraction 5 sec hold x 10   Manual:  Rt shoulder inferior glides g3, posterior glide mobilization c movement c passive ER.    Vaso:  Seated Rt shoulder with arm supported for 10 minutes with medium compression at 34deg   TODAY'S TREATMENT:                                                                                                       DATE: 04/23/2024 TherEx:  Supine wand AAROM flexion 2 x 10 with 2-3 sec hold at end range Supine wand AAROM ER stretch in 45 deg 2-3 sec hold x 10  Seated scapular retraction 5 sec hold x 10     Manual:  Rt shoulder inferior glides g3, posterior glide mobilization c movement c passive ER.   Vaso:  Seated Rt shoulder with arm supported for 10 minutes with medium compression at 34deg   TODAY'S TREATMENT:                                                                                                       DATE: 04/21/2024 TherEx:  Rt UE pendulums, fwd/back, lateral, and CW/CCW circles; performed at beginning of session  and end  Supine AROM ER from 0-30deg x20; PT measuring out 30deg for appropriate AROM to follow protocol Supine bicep curls AROM-AAROM ; supinated hand 1x10 with  3s stretch into elbow extension; neutral grip 1x10 with 3s stretch into elbow extension  Rt tricep extension AROM with PT stabilizing humerus 1x12 Supine scapular retractions 1x10 with 2s hold; intermittent verbal cues for appropriate technique to decrease shoulder shrug  Supine scapular depression 1x10 with 2s hold ; demonstration provided with appropriate carryover from patient  Supine cervical retraction 1x10 with 2s hold  Seated UT stretch 1x30s each side   Manual:  Rt shoulder abduction and flexion PROM 0-80deg with grade 2 inferior glide in supine   Vaso:  Seated Rt shoulder with arm supported for 10 minutes with medium compression at 34deg   TODAY'S TREATMENT:                                                                                                       DATE: 04/15/2024 Therapeutic Exercise: RUE Pendulum starting small and progress to slight stretch: 10 reps each flex/ext, abd/add and circles CW/CCW. A set each At beginning and end of session.   RUE Biceps curls with soft elbow ext stretch 10 reps AA seated, 10 reps supine with palm up, 10 reps supine with palm neutral.  RUE triceps ext AROM with PT stabilizing humerus 10 reps.  Cervical retraction supine 10 reps with tactile cues for technique. BUE Scapular retraction (focus on limiting shoulder elevation) supine 10 reps with tactile cues for technique. BUE scapular depression supine 10 reps.  PT reviewed HEP and positioning while patient on vaso  Manual  Rt shoulder g2 inferior jt mobs for flexion and abduction to 80*, distraction with PROM in to elevation to tolerance. - done in supine.   Vaso right shoulder seated with arm supported 34* medium compression 10 min.     PATIENT EDUCATION: 04/30/2024 Education details: HEP update Person educated: Patient Education method: Programmer, multimedia, Demonstration, Verbal cues, and Handouts Education comprehension: verbalized understanding, returned demonstration, and verbal cues  required  HOME EXERCISE PROGRAM: Access Code: [email or web address] URL: [email or web address] Date: 04/30/2024 Prepared by: Ozell Silvan  Exercises - Seated Shoulder Flexion Towel Slide at Table Top  - 2 x daily - 7 x weekly - 2-3 sets - 10 reps - 5 hold - Seated Scapular Retraction  - 3-5 x daily - 7 x weekly - 1 sets - 10 reps - 3-5 hold - Supine Shoulder Flexion Extension AAROM with Dowel  - 2-3 x daily - 7 x weekly - 1-2 sets - 10-15 reps - 3 hold - Supine Shoulder External Rotation with Dowel at 20 Degrees of Abduction  - 2-3 x daily - 7 x weekly - 1 sets - 10 reps - 5 hold - Standing Isometric Shoulder Flexion with Doorway - Arm Bent  - 1-2 x daily - 7 x weekly - 1 sets - 10 reps - 5-10 hold - Standing Isometric Shoulder Abduction with Doorway - Arm Bent (Mirrored)  - 1-2 x daily - 7 x weekly - 1 sets -  10 reps - 5-10 hold   ASSESSMENT:  CLINICAL IMPRESSION:  Patient needed VC for isometric form using ball.  Demonstrated understanding. OBJECTIVE IMPAIRMENTS: decreased activity tolerance, decreased coordination, decreased endurance, decreased mobility, decreased ROM, decreased strength, hypomobility, increased edema, increased fascial restrictions, impaired perceived functional ability, increased muscle spasms, impaired flexibility, impaired UE functional use, improper body mechanics, postural dysfunction, and pain.   ACTIVITY LIMITATIONS: carrying, lifting, sleeping, transfers, bed mobility, bathing, toileting, dressing, self feeding, reach over head, hygiene/grooming, and caring for others  PARTICIPATION LIMITATIONS: meal prep, cleaning, laundry, interpersonal relationship, driving, shopping, community activity, occupation, and yard work  PERSONAL FACTORS: No specific factors are affecting patient's functional outcome.   REHAB POTENTIAL: Good  CLINICAL DECISION MAKING: Stable/uncomplicated  EVALUATION COMPLEXITY: Low   GOALS: Goals reviewed with patient? Yes  SHORT  TERM GOALS: (target date for Short term goals are 3 weeks 04/27/2024)  1.Patient will demonstrate independent use of home exercise program to maintain progress from in clinic treatments. Goal status:  Ongoing     04/15/2024  LONG TERM GOALS: (target dates for all long term goals are 10 weeks  06/15/2024 )   1. Patient will demonstrate/report pain at worst less than or equal to 2/10 to facilitate minimal limitation in daily activity secondary to pain symptoms. Goal status: Ongoing    04/15/2024   2. Patient will demonstrate independent use of home exercise program to facilitate ability to maintain/progress functional gains from skilled physical therapy services. Goal status:  Ongoing     04/15/2024   3. Patient will demonstrate Patient specific functional scale avg > or = 8/10 to indicate reduced disability due to condition.  Goal status:  Ongoing   04/15/2024   4.  Patient will demonstrate Rt UE MMT 5/5 throughout to facilitate lifting, reaching, carrying at [facility] in daily activity.   Goal status:  Ongoing   04/15/2024   5.  Patient will demonstrate Rt GH joint AROM WFL s symptoms to facilitate usual overhead reaching, self care, dressing at PLOF.    Goal status:  Ongoing  04/15/2024   6.  Patient will demonstrate/report ability to return to work at [employer].  Goal status: Ongoing   04/15/2024   PLAN:  PT FREQUENCY: 1-2x/week  PT DURATION: 10 weeks  PLANNED INTERVENTIONS: Can include 02853- PT Re-evaluation, 97110-Therapeutic exercises, 97530- Therapeutic activity, 97112- Neuromuscular re-education, 97535- Self Care, 97140- Manual therapy, [phone]- Gait training, [phone]- Orthotic Fit/training, [phone]- Canalith repositioning, [id number]- Aquatic Therapy, [phone]- Electrical stimulation (unattended), [id number] Physical performance testing, 97016- Vasopneumatic device, [id number]- Ultrasound, [id number]- Traction (mechanical), [id number]- Ionotophoresis 4mg /ml Dexamethasone ,  [id number] - Needle insertion w/o injection 1 or 2  muscles, 20561 - Needle insertion w/o injection 3 or more muscles.   Patient/Family education, Balance training, Stair training, Taping, Dry Needling, Joint mobilization, Joint manipulation, Spinal manipulation, Spinal mobilization, Scar mobilization, Vestibular training, Visual/preceptual remediation/compensation, DME instructions, Cryotherapy, and Moist heat.  All performed as medically necessary.  All included unless contraindicated  PLAN FOR NEXT SESSION:  AAROM, AROM, isometric continuation.  Has started weaning from sling as of 05/06/2024   To return to MD in 6 weeks (around 06/17/2024)  Burnard Meth, PT 05/12/24  3:43 PM

## 2024-05-12 ENCOUNTER — Ambulatory Visit (INDEPENDENT_AMBULATORY_CARE_PROVIDER_SITE_OTHER)

## 2024-05-12 DIAGNOSIS — R293 Abnormal posture: Secondary | ICD-10-CM | POA: Diagnosis not present

## 2024-05-12 DIAGNOSIS — M25511 Pain in right shoulder: Secondary | ICD-10-CM | POA: Diagnosis not present

## 2024-05-12 DIAGNOSIS — R600 Localized edema: Secondary | ICD-10-CM | POA: Diagnosis not present

## 2024-05-12 DIAGNOSIS — G8929 Other chronic pain: Secondary | ICD-10-CM

## 2024-05-12 DIAGNOSIS — M6281 Muscle weakness (generalized): Secondary | ICD-10-CM

## 2024-05-14 ENCOUNTER — Ambulatory Visit

## 2024-05-14 DIAGNOSIS — R293 Abnormal posture: Secondary | ICD-10-CM

## 2024-05-14 DIAGNOSIS — R600 Localized edema: Secondary | ICD-10-CM

## 2024-05-14 DIAGNOSIS — M25511 Pain in right shoulder: Secondary | ICD-10-CM

## 2024-05-14 DIAGNOSIS — G8929 Other chronic pain: Secondary | ICD-10-CM

## 2024-05-14 DIAGNOSIS — M6281 Muscle weakness (generalized): Secondary | ICD-10-CM | POA: Diagnosis not present

## 2024-05-14 NOTE — Therapy (Signed)
 OUTPATIENT PHYSICAL THERAPY  TREATMENT   Patient Name: Christina Cannon MRN: [id number] DOB:1965-09-07, 58 y.o., female Today's Date: 05/14/2024  END OF SESSION:  PT End of Session - 05/14/24 1340     Visit Number 10    Number of Visits 20    Date for Recertification  06/15/24    Authorization Type [facility] [employer] $25 copay    PT Start Time 1300    PT Stop Time 1348    PT Time Calculation (min) 48 min    Activity Tolerance Patient tolerated treatment well    Behavior During Therapy [facility] for tasks assessed/performed              Past Medical History:  Diagnosis Date   Diverticulitis    Hypertension    Pre-diabetes    Past Surgical History:  Procedure Laterality Date   BIOPSY  07/19/2022   Procedure: BIOPSY;  Surgeon: Stacia Glendia BRAVO, MD;  Location: [facility];  Service: Gastroenterology;;   CHOLECYSTECTOMY  1988   COLONOSCOPY WITH PROPOFOL  N/A 07/19/2022   Procedure: COLONOSCOPY WITH PROPOFOL ;  Surgeon: Stacia Glendia BRAVO, MD;  Location: [facility];  Service: Gastroenterology;  Laterality: N/A;   HAND SURGERY  1997   POLYPECTOMY N/A 07/19/2022   Procedure: POLYPECTOMY;  Surgeon: Stacia Glendia BRAVO, MD;  Location: [facility];  Service: Gastroenterology;  Laterality: N/A;   SHOULDER ARTHROSCOPY WITH ROTATOR CUFF REPAIR Right 03/25/2024   Procedure: RIGHT SHOULDER ARTHROSCOPY, ROTATOR CUFF REPAIR;  Surgeon: Jerri Kay HERO, MD;  Location: [facility];  Service: Orthopedics;  Laterality: Right;   SUBACROMIAL DECOMPRESSION Right 03/25/2024   Procedure: SUBACROMIAL DECOMPRESSION, EXTENSIVE DEBRIDEMENT;  Surgeon: Jerri Kay HERO, MD;  Location: [facility];  Service: Orthopedics;  Laterality: Right;   Patient Active Problem List   Diagnosis Date Noted   Arthritis of right acromioclavicular joint 03/25/2024   Impingement syndrome of right shoulder 03/25/2024   Traumatic complete tear of right rotator cuff 02/11/2024   Vitamin D  deficiency,  unspecified 05/28/2023   Prediabetes 05/28/2023   Polyp of colon 07/19/2022   Prolonged QT interval 12/21/2021   Colitis 12/21/2021   Diverticulitis 12/20/2021   Hypokalemia 12/20/2021   Obesity (BMI 30-39.9) 12/20/2021   Tobacco use 12/20/2021   History of colonic diverticulitis 07/09/2017   Primary osteoarthritis of both knees 07/09/2017   Primary osteoarthritis of both ankles 07/09/2017   Tobacco dependence 07/09/2017   Elevated blood pressure reading 07/09/2017   Leukocytosis 06/10/2017   Skin tag of vulva 08/10/2015    PCP: Vicci Barnie WENDI BARTON PROVIDER: Jule Ronal CROME, PA-C  REFERRING DIAG: [phone] (ICD-10-CM) - S/P rotator cuff repair  THERAPY DIAG:  Chronic right shoulder pain  Muscle weakness (generalized)  Localized edema  Abnormal posture  Rationale for Evaluation and Treatment: Rehabilitation  ONSET DATE: Surgery 03/25/2024  SUBJECTIVE:  SUBJECTIVE STATEMENT: Pt reports soreness still especially with trying to style her hair.  Has been out of sling since last visit.   Rt hand dominant.    PERTINENT HISTORY: S/p right shoulder rotator cuff repair on 03/25/2024  Wearing sling for 5 weeks past 04/01/2024 PA visit   PAIN:  NPRS scale: 4/10 soreness, not pain Pain location: Rt shoulder/upper arm Pain description: sharp, dull, achy Aggravating factors: nighttime, any arm movement.  Relieving factors: medicine, resting it   PRECAUTIONS: Shoulder :  Rotator cuff repair.  Wearing sling for 5 weeks past 04/01/2024 PA visit   WEIGHT BEARING RESTRICTIONS: Yes Rt shoulder  FALLS:  Has patient fallen in last 6 months? 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  Plan to get back to work.   PLOF:  Independent, recreational softball, basketball, soccer at times.  Cooking, yard work/gardening.   PATIENT GOALS:Reduce pain , get back to work and activity.   OBJECTIVE:   PATIENT SURVEYS:  Patient-Specific Activity Scoring Scheme  0 represents "unable to perform." 10 represents "able to perform at prior level. 0 1 2 3 4 5 6 7 8 9  10 (Date and Score)   Activity Eval  04/06/2024    1. Bathing   5    2. Comb hair  2    3. Cook 2   4. sweep 0   5. sleep 4   Score 2.6 avg    Total score = sum of the activity scores/number of activities Minimum detectable change (90%CI) for average score = 2 points Minimum detectable change (90%CI) for single activity score = 3 points  COGNITION: 04/06/2024 Overall cognitive status: WFL     SENSATION: 04/06/2024 No specific testing today.   POSTURE: 04/06/2024 Wearing sling, rounded shoulders noted in sitting.   UPPER EXTREMITY ROM:   ROM Right Eval 04/06/2024 PROM Limited by pian, muscle guarding Right 04/23/2024  PROM in supine Right 04/28/2024  Shoulder flexion 100 130 150 AAROM with wand in supine   Shoulder extension     Shoulder abduction 80 110   Shoulder adduction     Shoulder internal rotation  65 in 45 deg abduction    Shoulder external rotation 0 with arm in 20 deg abduction 65 in 45 deg abduction   Elbow flexion     Elbow extension     Wrist flexion     Wrist extension     Wrist ulnar deviation     Wrist radial deviation     Wrist pronation     Wrist supination     (Blank rows = not tested)  UPPER EXTREMITY MMT:  MMT Right Eval 04/06/2024  No testing due to surgery Left Eval 04/06/2024  Shoulder flexion  5/5  Shoulder extension    Shoulder abduction  5/5  Shoulder adduction    Shoulder internal rotation  5/5  Shoulder external rotation  5/5  Middle trapezius    Lower trapezius    Elbow flexion    Elbow extension    Wrist flexion    Wrist extension    Wrist ulnar deviation    Wrist radial  deviation    Wrist pronation    Wrist supination    Grip strength (lbs)    (Blank rows = not tested)  SHOULDER SPECIAL TESTS: 04/06/2024 No specific testing   JOINT MOBILITY TESTING:  04/06/2024 Mid range inferior Rt GH joint passive accessory motion WFL.   PALPATION:  04/06/2024  General tenderness around incision, Rt upper arm.                                                                                                                                                                                                  TODAY'S TREATMENT:                                                               05/14/24 R shoulder TherEx:  Pulley into shoulder flexion 3 minutes with 3s holds, shoulder scaption 3 minutes with 3s holds  Wall ladder  8x Standing shoulder isometrics flex, abd, IR 1x15 with 5s hold  Supine AAROM shoulder flexion with PVC 2x10 with 2-3s hold  Supine AROM shoulder abduction 2x10  Seated shrugs /retractions 20x Manual - scap glides  Vaso:  Seated Rt shoulder with arm supported for 10 minutes with medium compression at 34deg       DATE: 05/12/24 TherEx:  Pulley into shoulder flexion 3 minutes with 3s holds, shoulder scaption 3 minutes with 3s holds  Standing shoulder flexion isometrics 1x15 with 5s hold  Standing shoulder abduction isometrics 1x12 with 5s hold  Supine AAROM shoulder flexion with PVC 2x15 with 2-3s hold  Supine AROM shoulder abduction 2x10  Manual - scap glides  Vaso:  Seated Rt shoulder with arm supported for 10 minutes with medium compression at 34deg     DATE: 05/07/2024 TherEx:  Pulley into shoulder flexion 3 minutes with 3s holds, shoulder scaption 3 minutes with 3s holds  Standing shoulder flexion isometrics 1x15 with 5s hold  Standing shoulder abduction isometrics 1x12 with 5s hold  Supine AAROM shoulder flexion with PVC 2x15 with 2-3s hold  Supine AROM shoulder abduction 2x10   Vaso:  Seated Rt shoulder with arm supported for  10 minutes with medium compression at 34deg   PATIENT EDUCATION: 04/30/2024 Education details: HEP update Person educated: Patient Education method: Programmer, multimedia, Facilities manager, Verbal cues, and Handouts Education comprehension: verbalized understanding, returned demonstration, and verbal cues required  HOME EXERCISE PROGRAM: Access Code: [email or web address] URL: [email or web address] Date: 04/30/2024 Prepared by: Ozell Silvan  Exercises - Seated Shoulder Flexion Towel Slide at Table Top  - 2 x daily - 7 x weekly - 2-3 sets - 10 reps - 5 hold - Seated Scapular Retraction  - 3-5 x daily - 7 x weekly - 1 sets - 10 reps - 3-5 hold -  Supine Shoulder Flexion Extension AAROM with Dowel  - 2-3 x daily - 7 x weekly - 1-2 sets - 10-15 reps - 3 hold - Supine Shoulder External Rotation with Dowel at 20 Degrees of Abduction  - 2-3 x daily - 7 x weekly - 1 sets - 10 reps - 5 hold - Standing Isometric Shoulder Flexion with Doorway - Arm Bent  - 1-2 x daily - 7 x weekly - 1 sets - 10 reps - 5-10 hold - Standing Isometric Shoulder Abduction with Doorway - Arm Bent (Mirrored)  - 1-2 x daily - 7 x weekly - 1 sets - 10 reps - 5-10 hold   ASSESSMENT:  CLINICAL IMPRESSION:  Patient reported feeling less spasms after there ex program today. OBJECTIVE IMPAIRMENTS: decreased activity tolerance, decreased coordination, decreased endurance, decreased mobility, decreased ROM, decreased strength, hypomobility, increased edema, increased fascial restrictions, impaired perceived functional ability, increased muscle spasms, impaired flexibility, impaired UE functional use, improper body mechanics, postural dysfunction, and pain.   ACTIVITY LIMITATIONS: carrying, lifting, sleeping, transfers, bed mobility, bathing, toileting, dressing, self feeding, reach over head, hygiene/grooming, and caring for others  PARTICIPATION LIMITATIONS: meal prep, cleaning, laundry, interpersonal relationship, driving, shopping,  community activity, occupation, and yard work  PERSONAL FACTORS: No specific factors are affecting patient's functional outcome.   REHAB POTENTIAL: Good  CLINICAL DECISION MAKING: Stable/uncomplicated  EVALUATION COMPLEXITY: Low   GOALS: Goals reviewed with patient? Yes  SHORT TERM GOALS: (target date for Short term goals are 3 weeks 04/27/2024)  1.Patient will demonstrate independent use of home exercise program to maintain progress from in clinic treatments. Goal status:  Ongoing     04/15/2024  LONG TERM GOALS: (target dates for all long term goals are 10 weeks  06/15/2024 )   1. Patient will demonstrate/report pain at worst less than or equal to 2/10 to facilitate minimal limitation in daily activity secondary to pain symptoms. Goal status: Ongoing    04/15/2024   2. Patient will demonstrate independent use of home exercise program to facilitate ability to maintain/progress functional gains from skilled physical therapy services. Goal status:  Ongoing     04/15/2024   3. Patient will demonstrate Patient specific functional scale avg > or = 8/10 to indicate reduced disability due to condition.  Goal status:  Ongoing   04/15/2024   4.  Patient will demonstrate Rt UE MMT 5/5 throughout to facilitate lifting, reaching, carrying at [facility] in daily activity.   Goal status:  Ongoing   04/15/2024   5.  Patient will demonstrate Rt GH joint AROM WFL s symptoms to facilitate usual overhead reaching, self care, dressing at PLOF.    Goal status:  Ongoing  04/15/2024   6.  Patient will demonstrate/report ability to return to work at [employer].  Goal status: Ongoing   04/15/2024   PLAN:  PT FREQUENCY: 1-2x/week  PT DURATION: 10 weeks  PLANNED INTERVENTIONS: Can include 02853- PT Re-evaluation, 97110-Therapeutic exercises, 97530- Therapeutic activity, 97112- Neuromuscular re-education, 97535- Self Care, 97140- Manual therapy, [phone]- Gait training, [phone]- Orthotic Fit/training, [phone]- Canalith  repositioning, [id number]- Aquatic Therapy, [phone]- Electrical stimulation (unattended), [id number] Physical performance testing, 97016- Vasopneumatic device, [id number]- Ultrasound, [id number]- Traction (mechanical), [id number]- Ionotophoresis 4mg /ml Dexamethasone ,  [id number] - Needle insertion w/o injection 1 or 2 muscles, 20561 - Needle insertion w/o injection 3 or more muscles.   Patient/Family education, Balance training, Stair training, Taping, Dry Needling, Joint mobilization, Joint manipulation, Spinal manipulation, Spinal mobilization, Scar mobilization, Vestibular training, Visual/preceptual remediation/compensation, DME instructions, Cryotherapy, and Moist  heat.  All performed as medically necessary.  All included unless contraindicated  PLAN FOR NEXT SESSION:  AAROM, AROM, isometric continuation.   To return to MD in 6 weeks (around 06/17/2024)  Burnard Meth, PT 05/14/24  1:41 PM

## 2024-05-18 ENCOUNTER — Ambulatory Visit: Admitting: Internal Medicine

## 2024-05-19 ENCOUNTER — Encounter: Payer: Self-pay | Admitting: Rehabilitative and Restorative Service Providers"

## 2024-05-19 ENCOUNTER — Ambulatory Visit: Admitting: Rehabilitative and Restorative Service Providers"

## 2024-05-19 DIAGNOSIS — M6281 Muscle weakness (generalized): Secondary | ICD-10-CM

## 2024-05-19 DIAGNOSIS — R293 Abnormal posture: Secondary | ICD-10-CM | POA: Diagnosis not present

## 2024-05-19 DIAGNOSIS — M25511 Pain in right shoulder: Secondary | ICD-10-CM | POA: Diagnosis not present

## 2024-05-19 DIAGNOSIS — R600 Localized edema: Secondary | ICD-10-CM | POA: Diagnosis not present

## 2024-05-19 DIAGNOSIS — G8929 Other chronic pain: Secondary | ICD-10-CM | POA: Diagnosis not present

## 2024-05-19 NOTE — Therapy (Signed)
 OUTPATIENT PHYSICAL THERAPY  TREATMENT   Patient Name: Christina Cannon MRN: [id number] DOB:[birth year], 58 y.o., female Today's Date: 05/19/2024  END OF SESSION:  PT End of Session - 05/19/24 1431     Visit Number 11    Number of Visits 20    Date for Recertification  06/15/24    Authorization Type [facility] [employer] $25 copay    PT Start Time 1429    PT Stop Time 1508    PT Time Calculation (min) 39 min    Activity Tolerance Patient tolerated treatment well    Behavior During Therapy [facility] for tasks assessed/performed               Past Medical History:  Diagnosis Date   Diverticulitis    Hypertension    Pre-diabetes    Past Surgical History:  Procedure Laterality Date   BIOPSY  07/19/2022   Procedure: BIOPSY;  Surgeon: Stacia Glendia BRAVO, MD;  Location: [facility];  Service: Gastroenterology;;   CHOLECYSTECTOMY  1988   COLONOSCOPY WITH PROPOFOL  N/A 07/19/2022   Procedure: COLONOSCOPY WITH PROPOFOL ;  Surgeon: Stacia Glendia BRAVO, MD;  Location: [facility];  Service: Gastroenterology;  Laterality: N/A;   HAND SURGERY  1997   POLYPECTOMY N/A 07/19/2022   Procedure: POLYPECTOMY;  Surgeon: Stacia Glendia BRAVO, MD;  Location: [facility];  Service: Gastroenterology;  Laterality: N/A;   SHOULDER ARTHROSCOPY WITH ROTATOR CUFF REPAIR Right 03/25/2024   Procedure: RIGHT SHOULDER ARTHROSCOPY, ROTATOR CUFF REPAIR;  Surgeon: Jerri Kay HERO, MD;  Location: [facility];  Service: Orthopedics;  Laterality: Right;   SUBACROMIAL DECOMPRESSION Right 03/25/2024   Procedure: SUBACROMIAL DECOMPRESSION, EXTENSIVE DEBRIDEMENT;  Surgeon: Jerri Kay HERO, MD;  Location: [facility];  Service: Orthopedics;  Laterality: Right;   Patient Active Problem List   Diagnosis Date Noted   Arthritis of right acromioclavicular joint 03/25/2024   Impingement syndrome of right shoulder 03/25/2024   Traumatic complete tear of right rotator cuff 02/11/2024   Vitamin D   deficiency, unspecified 05/28/2023   Prediabetes 05/28/2023   Polyp of colon 07/19/2022   Prolonged QT interval 12/21/2021   Colitis 12/21/2021   Diverticulitis 12/20/2021   Hypokalemia 12/20/2021   Obesity (BMI 30-39.9) 12/20/2021   Tobacco use 12/20/2021   History of colonic diverticulitis 07/09/2017   Primary osteoarthritis of both knees 07/09/2017   Primary osteoarthritis of both ankles 07/09/2017   Tobacco dependence 07/09/2017   Elevated blood pressure reading 07/09/2017   Leukocytosis 06/10/2017   Skin tag of vulva 08/10/2015    PCP: Vicci Barnie WENDI BARTON PROVIDER: Jule Ronal CROME, PA-C  REFERRING DIAG: [phone] (ICD-10-CM) - S/P rotator cuff repair  THERAPY DIAG:  Chronic right shoulder pain  Muscle weakness (generalized)  Localized edema  Abnormal posture  Rationale for Evaluation and Treatment: Rehabilitation  ONSET DATE: Surgery 03/25/2024  SUBJECTIVE:  SUBJECTIVE STATEMENT: Pt indicated soreness up to 5/10 at night after general use of arm during the day.  Reported no pain upon arrival today.   Rt hand dominant.    PERTINENT HISTORY: S/p right shoulder rotator cuff repair on 03/25/2024  Wearing sling for 5 weeks past 04/01/2024 PA visit   PAIN:  NPRS scale: up to 5/10.  Pain location: Rt shoulder/upper arm Pain description: sharp, dull, achy Aggravating factors: nighttime, any arm movement.  Relieving factors: medicine, resting it   PRECAUTIONS: Shoulder :  Rotator cuff repair.  Wearing sling for 5 weeks past 04/01/2024 PA visit   WEIGHT BEARING RESTRICTIONS: Yes Rt shoulder  FALLS:  Has patient fallen in last 6 months? 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  Plan to get back to work.    PLOF: Independent, recreational softball, basketball, soccer at times.  Cooking, yard work/gardening.   PATIENT GOALS:Reduce pain , get back to work and activity.   OBJECTIVE:   PATIENT SURVEYS:  Patient-Specific Activity Scoring Scheme  0 represents "unable to perform." 10 represents "able to perform at prior level. 0 1 2 3 4 5 6 7 8 9  10 (Date and Score)   Activity Eval  04/06/2024  05/19/2024  1. Bathing   5  8  2. Comb hair  2  5  3. Cook 2 7  4. sweep 0 9  5. sleep 4 5  Score 2.6 avg 6.8 avg   Total score = sum of the activity scores/number of activities Minimum detectable change (90%CI) for average score = 2 points Minimum detectable change (90%CI) for single activity score = 3 points  COGNITION: 04/06/2024 Overall cognitive status: WFL     SENSATION: 04/06/2024 No specific testing today.   POSTURE: 04/06/2024 Wearing sling, rounded shoulders noted in sitting.   UPPER EXTREMITY ROM:   ROM Right Eval 04/06/2024 PROM Limited by pian, muscle guarding Right 04/23/2024  PROM in supine Right 04/28/2024  Shoulder flexion 100 130 150 AAROM with wand in supine   Shoulder extension     Shoulder abduction 80 110   Shoulder adduction     Shoulder internal rotation  65 in 45 deg abduction    Shoulder external rotation 0 with arm in 20 deg abduction 65 in 45 deg abduction   Elbow flexion     Elbow extension     Wrist flexion     Wrist extension     Wrist ulnar deviation     Wrist radial deviation     Wrist pronation     Wrist supination     (Blank rows = not tested)  UPPER EXTREMITY MMT:  MMT Right Eval 04/06/2024  No testing due to surgery Left Eval 04/06/2024 Right 05/19/2024  Shoulder flexion  5/5 2+/5 (limited in full range against gravity)  Shoulder extension     Shoulder abduction  5/5   Shoulder adduction     Shoulder internal rotation  5/5   Shoulder external rotation  5/5   Middle trapezius     Lower trapezius     Elbow flexion      Elbow extension     Wrist flexion     Wrist extension     Wrist ulnar deviation     Wrist radial deviation     Wrist pronation     Wrist supination     Grip strength (lbs)     (Blank rows = not tested)  SHOULDER SPECIAL TESTS: 04/06/2024 No specific testing   JOINT MOBILITY TESTING:  04/06/2024 Mid range inferior Rt GH joint passive accessory motion WFL.   PALPATION:  04/06/2024 General tenderness around incision, Rt upper arm.                                                                                                                                                                                                  TODAY'S TREATMENT:                                                              DATE:  05/19/2024 Therex: UBE UE only 3 mins each way lvl 2.5 for ROM Sidelying Rt shoulder ER with towel under arm 1 lb x 15  Sidelying Rt shoulder abduction x 15 Sidelying Rt shoulder flexion x 15  Standing wall flexion reach bilateral x 10   Cues for updated HEP with additional printout provided.   Neuro Re-ed (muscle activation ) Standing blue band rows c scapular retraction focus 2 x 15 Standing blue band GH extension 2 x 15 Standing green band Rt shoulder ER walk outs with towel under arm 5 sec hold x 10    TODAY'S TREATMENT:                                                              DATE: 05/14/24 R shoulder TherEx:  Pulley into shoulder flexion 3 minutes with 3s holds, shoulder scaption 3 minutes with 3s holds  Wall ladder  8x Standing shoulder isometrics flex, abd, IR 1x15 with 5s hold  Supine AAROM shoulder flexion with PVC 2x10 with 2-3s hold  Supine AROM shoulder abduction 2x10  Seated shrugs /retractions 20x Manual - scap glides  Vaso:  Seated Rt shoulder with arm supported for 10 minutes with medium compression at 34deg    TODAY'S TREATMENT:  DATE:  05/12/24 TherEx:  Pulley into shoulder flexion 3  minutes with 3s holds, shoulder scaption 3 minutes with 3s holds  Standing shoulder flexion isometrics 1x15 with 5s hold  Standing shoulder abduction isometrics 1x12 with 5s hold  Supine AAROM shoulder flexion with PVC 2x15 with 2-3s hold  Supine AROM shoulder abduction 2x10  Manual - scap glides  Vaso:  Seated Rt shoulder with arm supported for 10 minutes with medium compression at 34deg     PATIENT EDUCATION: 04/30/2024 Education details: HEP update Person educated: Patient Education method: Programmer, Multimedia, Facilities Manager, Verbal cues, and Handouts Education comprehension: verbalized understanding, returned demonstration, and verbal cues required  HOME EXERCISE PROGRAM: Access Code: [email or web address] URL: [email or web address] Date: 05/19/2024 Prepared by: Ozell Silvan  Exercises - Seated Scapular Retraction  - 3-5 x daily - 7 x weekly - 1 sets - 10 reps - 3-5 hold - Supine Shoulder External Rotation with Dowel at 20 Degrees of Abduction  - 2-3 x daily - 7 x weekly - 1 sets - 10 reps - 5 hold - Sidelying Shoulder Abduction Palm Forward  - 1-2 x daily - 7 x weekly - 2-3 sets - 10-15 reps - Sidelying Shoulder External Rotation (Mirrored)  - 1-2 x daily - 7 x weekly - 2-3 sets - 10-15 reps - Sidelying Shoulder Flexion 15 Degrees (Mirrored)  - 1-2 x daily - 7 x weekly - 2-3 sets - 10-15 reps - Standing Bilateral Low Shoulder Row with Anchored Resistance  - 1-2 x daily - 7 x weekly - 2-3 sets - 10-15 reps - Shoulder Extension with Resistance  - 1-2 x daily - 7 x weekly - 1-2 sets - 10-15 reps - Standing shoulder flexion wall slides  - 1-2 x daily - 7 x weekly - 1 sets - 10 reps - 5 hold   ASSESSMENT:  CLINICAL IMPRESSION: At this point, patient is almost 8 weeks post surgery (tomorrow).  Pt to benefit from active range improvements with early strengthening to continue to improve arm use in daily activity.  Discussed soreness possibility with increased active range exercise.    Patient specific functional scale updated with improvement noted.   OBJECTIVE IMPAIRMENTS: decreased activity tolerance, decreased coordination, decreased endurance, decreased mobility, decreased ROM, decreased strength, hypomobility, increased edema, increased fascial restrictions, impaired perceived functional ability, increased muscle spasms, impaired flexibility, impaired UE functional use, improper body mechanics, postural dysfunction, and pain.   ACTIVITY LIMITATIONS: carrying, lifting, sleeping, transfers, bed mobility, bathing, toileting, dressing, self feeding, reach over head, hygiene/grooming, and caring for others  PARTICIPATION LIMITATIONS: meal prep, cleaning, laundry, interpersonal relationship, driving, shopping, community activity, occupation, and yard work  PERSONAL FACTORS: No specific factors are affecting patient's functional outcome.   REHAB POTENTIAL: Good  CLINICAL DECISION MAKING: Stable/uncomplicated  EVALUATION COMPLEXITY: Low   GOALS: Goals reviewed with patient? Yes  SHORT TERM GOALS: (target date for Short term goals are 3 weeks 04/27/2024)  1.Patient will demonstrate independent use of home exercise program to maintain progress from in clinic treatments. Goal status:  Ongoing     04/15/2024  LONG TERM GOALS: (target dates for all long term goals are 10 weeks  06/15/2024 )   1. Patient will demonstrate/report pain at worst less than or equal to 2/10 to facilitate minimal limitation in daily activity secondary to pain symptoms. Goal status: Ongoing    05/19/2024   2. Patient will demonstrate independent use of home exercise program to facilitate ability to maintain/progress functional gains from skilled physical therapy services. Goal  status:  Ongoing     05/19/2024   3. Patient will demonstrate Patient specific functional scale avg > or = 8/10 to indicate reduced disability due to condition.  Goal status:  Ongoing  05/19/2024   4.  Patient will  demonstrate Rt UE MMT 5/5 throughout to facilitate lifting, reaching, carrying at [facility] in daily activity.   Goal status:  Ongoing   05/19/2024   5.  Patient will demonstrate Rt GH joint AROM WFL s symptoms to facilitate usual overhead reaching, self care, dressing at PLOF.    Goal status:  Ongoing  05/19/2024   6.  Patient will demonstrate/report ability to return to work at [employer].  Goal status: Ongoing   05/19/2024   PLAN:  PT FREQUENCY: 1-2x/week  PT DURATION: 10 weeks  PLANNED INTERVENTIONS: Can include 02853- PT Re-evaluation, 97110-Therapeutic exercises, 97530- Therapeutic activity, 97112- Neuromuscular re-education, 97535- Self Care, 97140- Manual therapy, [phone]- Gait training, [phone]- Orthotic Fit/training, [phone]- Canalith repositioning, [id number]- Aquatic Therapy, [phone]- Electrical stimulation (unattended), [id number] Physical performance testing, 97016- Vasopneumatic device, [id number]- Ultrasound, [id number]- Traction (mechanical), [id number]- Ionotophoresis 4mg /ml Dexamethasone ,  [id number] - Needle insertion w/o injection 1 or 2 muscles, 20561 - Needle insertion w/o injection 3 or more muscles.   Patient/Family education, Balance training, Stair training, Taping, Dry Needling, Joint mobilization, Joint manipulation, Spinal manipulation, Spinal mobilization, Scar mobilization, Vestibular training, Visual/preceptual remediation/compensation, DME instructions, Cryotherapy, and Moist heat.  All performed as medically necessary.  All included unless contraindicated  PLAN FOR NEXT SESSION:  AROM, early strengthening to tolerance. Advised more visits scheduling but Pt indicated concerned about return to work and scheduling ability.   8 weeks post surgery on 05/20/2024.   To return to [place] in 6 weeks (around 06/17/2024)  Ozell Silvan, PT, DPT, OCS, ATC 05/19/24  3:10 PM

## 2024-05-21 ENCOUNTER — Encounter: Admitting: Rehabilitative and Restorative Service Providers"

## 2024-05-21 NOTE — Therapy (Incomplete)
 OUTPATIENT PHYSICAL THERAPY  TREATMENT   Patient Name: Christina Cannon MRN: [id number] DOB:[birth year], 58 y.o., female Today's Date: 05/21/2024  END OF SESSION:         Past Medical History:  Diagnosis Date   Diverticulitis    Hypertension    Pre-diabetes    Past Surgical History:  Procedure Laterality Date   BIOPSY  07/19/2022   Procedure: BIOPSY;  Surgeon: Stacia Glendia BRAVO, MD;  Location: [facility];  Service: Gastroenterology;;   CHOLECYSTECTOMY  1988   COLONOSCOPY WITH PROPOFOL  N/A 07/19/2022   Procedure: COLONOSCOPY WITH PROPOFOL ;  Surgeon: Stacia Glendia BRAVO, MD;  Location: [facility];  Service: Gastroenterology;  Laterality: N/A;   HAND SURGERY  1997   POLYPECTOMY N/A 07/19/2022   Procedure: POLYPECTOMY;  Surgeon: Stacia Glendia BRAVO, MD;  Location: [facility];  Service: Gastroenterology;  Laterality: N/A;   SHOULDER ARTHROSCOPY WITH ROTATOR CUFF REPAIR Right 03/25/2024   Procedure: RIGHT SHOULDER ARTHROSCOPY, ROTATOR CUFF REPAIR;  Surgeon: Jerri Kay HERO, MD;  Location: [facility];  Service: Orthopedics;  Laterality: Right;   SUBACROMIAL DECOMPRESSION Right 03/25/2024   Procedure: SUBACROMIAL DECOMPRESSION, EXTENSIVE DEBRIDEMENT;  Surgeon: Jerri Kay HERO, MD;  Location: [facility];  Service: Orthopedics;  Laterality: Right;   Patient Active Problem List   Diagnosis Date Noted   Arthritis of right acromioclavicular joint 03/25/2024   Impingement syndrome of right shoulder 03/25/2024   Traumatic complete tear of right rotator cuff 02/11/2024   Vitamin D  deficiency, unspecified 05/28/2023   Prediabetes 05/28/2023   Polyp of colon 07/19/2022   Prolonged QT interval 12/21/2021   Colitis 12/21/2021   Diverticulitis 12/20/2021   Hypokalemia 12/20/2021   Obesity (BMI 30-39.9) 12/20/2021   Tobacco use 12/20/2021   History of colonic diverticulitis 07/09/2017   Primary osteoarthritis of both knees 07/09/2017   Primary  osteoarthritis of both ankles 07/09/2017   Tobacco dependence 07/09/2017   Elevated blood pressure reading 07/09/2017   Leukocytosis 06/10/2017   Skin tag of vulva 08/10/2015    PCP: Vicci Barnie WENDI BARTON PROVIDER: Jule Ronal CROME, PA-C  REFERRING DIAG: [phone] (ICD-10-CM) - S/P rotator cuff repair  THERAPY DIAG:  No diagnosis found.  Rationale for Evaluation and Treatment: Rehabilitation  ONSET DATE: Surgery 03/25/2024  SUBJECTIVE:                                                                                                                                                                                      SUBJECTIVE STATEMENT: Pt indicated soreness up to 5/10 at night after general use of arm during the day.  Reported no pain upon arrival today.   Rt  hand dominant.    PERTINENT HISTORY: S/p right shoulder rotator cuff repair on 03/25/2024  Wearing sling for 5 weeks past 04/01/2024 PA visit   PAIN:  NPRS scale: up to 5/10.  Pain location: Rt shoulder/upper arm Pain description: sharp, dull, achy Aggravating factors: nighttime, any arm movement.  Relieving factors: medicine, resting it   PRECAUTIONS: Shoulder :  Rotator cuff repair.  Wearing sling for 5 weeks past 04/01/2024 PA visit   WEIGHT BEARING RESTRICTIONS: Yes Rt shoulder  FALLS:  Has patient fallen in last 6 months? 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  Plan to get back to work.   PLOF: Independent, recreational softball, basketball, soccer at times.  Cooking, yard work/gardening.   PATIENT GOALS:Reduce pain , get back to work and activity.   OBJECTIVE:   PATIENT SURVEYS:  Patient-Specific Activity Scoring Scheme  0 represents "unable to perform." 10 represents "able to perform at prior level. 0 1 2 3 4 5 6 7 8 9  10 (Date and Score)   Activity Eval  04/06/2024  05/19/2024  1. Bathing   5  8  2. Comb hair   2  5  3. Cook 2 7  4. sweep 0 9  5. sleep 4 5  Score 2.6 avg 6.8 avg   Total score = sum of the activity scores/number of activities Minimum detectable change (90%CI) for average score = 2 points Minimum detectable change (90%CI) for single activity score = 3 points  COGNITION: 04/06/2024 Overall cognitive status: WFL     SENSATION: 04/06/2024 No specific testing today.   POSTURE: 04/06/2024 Wearing sling, rounded shoulders noted in sitting.   UPPER EXTREMITY ROM:   ROM Right Eval 04/06/2024 PROM Limited by pian, muscle guarding Right 04/23/2024  PROM in supine Right 04/28/2024  Shoulder flexion 100 130 150 AAROM with wand in supine   Shoulder extension     Shoulder abduction 80 110   Shoulder adduction     Shoulder internal rotation  65 in 45 deg abduction    Shoulder external rotation 0 with arm in 20 deg abduction 65 in 45 deg abduction   Elbow flexion     Elbow extension     Wrist flexion     Wrist extension     Wrist ulnar deviation     Wrist radial deviation     Wrist pronation     Wrist supination     (Blank rows = not tested)  UPPER EXTREMITY MMT:  MMT Right Eval 04/06/2024  No testing due to surgery Left Eval 04/06/2024 Right 05/19/2024  Shoulder flexion  5/5 2+/5 (limited in full range against gravity)  Shoulder extension     Shoulder abduction  5/5   Shoulder adduction     Shoulder internal rotation  5/5   Shoulder external rotation  5/5   Middle trapezius     Lower trapezius     Elbow flexion     Elbow extension     Wrist flexion     Wrist extension     Wrist ulnar deviation     Wrist radial deviation     Wrist pronation     Wrist supination     Grip strength (lbs)     (Blank rows = not tested)  SHOULDER SPECIAL TESTS: 04/06/2024 No specific testing   JOINT MOBILITY TESTING:  04/06/2024 Mid range inferior Rt GH joint passive accessory motion WFL.   PALPATION:  04/06/2024  General tenderness around incision, Rt upper arm.                                                                                                                                                                                                   TODAY'S TREATMENT:                                                              DATE:  05/21/2024 Therex:      TODAY'S TREATMENT:                                                              DATE:  05/19/2024 Therex: UBE UE only 3 mins each way lvl 2.5 for ROM Sidelying Rt shoulder ER with towel under arm 1 lb x 15  Sidelying Rt shoulder abduction x 15 Sidelying Rt shoulder flexion x 15  Standing wall flexion reach bilateral x 10   Cues for updated HEP with additional printout provided.   Neuro Re-ed (muscle activation ) Standing blue band rows c scapular retraction focus 2 x 15 Standing blue band GH extension 2 x 15 Standing green band Rt shoulder ER walk outs with towel under arm 5 sec hold x 10    TODAY'S TREATMENT:                                                              DATE: 05/14/24 R shoulder TherEx:  Pulley into shoulder flexion 3 minutes with 3s holds, shoulder scaption 3 minutes with 3s holds  Wall ladder  8x Standing shoulder isometrics flex, abd, IR 1x15 with 5s hold  Supine AAROM shoulder flexion with PVC 2x10 with 2-3s hold  Supine AROM shoulder abduction 2x10  Seated shrugs /retractions 20x Manual - scap glides  Vaso:  Seated Rt shoulder with arm supported for 10 minutes with medium compression at 34deg    TODAY'S TREATMENT:  DATE:  05/12/24 TherEx:  Pulley into shoulder flexion 3 minutes with 3s holds, shoulder scaption 3 minutes with 3s holds  Standing shoulder flexion isometrics 1x15 with 5s hold  Standing shoulder abduction isometrics 1x12 with 5s hold  Supine AAROM shoulder flexion with PVC 2x15 with 2-3s hold  Supine AROM shoulder abduction 2x10  Manual - scap glides  Vaso:  Seated Rt shoulder with arm  supported for 10 minutes with medium compression at 34deg     PATIENT EDUCATION: 04/30/2024 Education details: HEP update Person educated: Patient Education method: Programmer, Multimedia, Facilities Manager, Verbal cues, and Handouts Education comprehension: verbalized understanding, returned demonstration, and verbal cues required  HOME EXERCISE PROGRAM: Access Code: [email or web address] URL: [email or web address] Date: 05/19/2024 Prepared by: Ozell Silvan  Exercises - Seated Scapular Retraction  - 3-5 x daily - 7 x weekly - 1 sets - 10 reps - 3-5 hold - Supine Shoulder External Rotation with Dowel at 20 Degrees of Abduction  - 2-3 x daily - 7 x weekly - 1 sets - 10 reps - 5 hold - Sidelying Shoulder Abduction Palm Forward  - 1-2 x daily - 7 x weekly - 2-3 sets - 10-15 reps - Sidelying Shoulder External Rotation (Mirrored)  - 1-2 x daily - 7 x weekly - 2-3 sets - 10-15 reps - Sidelying Shoulder Flexion 15 Degrees (Mirrored)  - 1-2 x daily - 7 x weekly - 2-3 sets - 10-15 reps - Standing Bilateral Low Shoulder Row with Anchored Resistance  - 1-2 x daily - 7 x weekly - 2-3 sets - 10-15 reps - Shoulder Extension with Resistance  - 1-2 x daily - 7 x weekly - 1-2 sets - 10-15 reps - Standing shoulder flexion wall slides  - 1-2 x daily - 7 x weekly - 1 sets - 10 reps - 5 hold   ASSESSMENT:  CLINICAL IMPRESSION: At this point, patient is almost 8 weeks post surgery (tomorrow).  Pt to benefit from active range improvements with early strengthening to continue to improve arm use in daily activity.  Discussed soreness possibility with increased active range exercise.   Patient specific functional scale updated with improvement noted.   OBJECTIVE IMPAIRMENTS: decreased activity tolerance, decreased coordination, decreased endurance, decreased mobility, decreased ROM, decreased strength, hypomobility, increased edema, increased fascial restrictions, impaired perceived functional ability, increased muscle  spasms, impaired flexibility, impaired UE functional use, improper body mechanics, postural dysfunction, and pain.   ACTIVITY LIMITATIONS: carrying, lifting, sleeping, transfers, bed mobility, bathing, toileting, dressing, self feeding, reach over head, hygiene/grooming, and caring for others  PARTICIPATION LIMITATIONS: meal prep, cleaning, laundry, interpersonal relationship, driving, shopping, community activity, occupation, and yard work  PERSONAL FACTORS: No specific factors are affecting patient's functional outcome.   REHAB POTENTIAL: Good  CLINICAL DECISION MAKING: Stable/uncomplicated  EVALUATION COMPLEXITY: Low   GOALS: Goals reviewed with patient? Yes  SHORT TERM GOALS: (target date for Short term goals are 3 weeks 04/27/2024)  1.Patient will demonstrate independent use of home exercise program to maintain progress from in clinic treatments. Goal status:  Ongoing     04/15/2024  LONG TERM GOALS: (target dates for all long term goals are 10 weeks  06/15/2024 )   1. Patient will demonstrate/report pain at worst less than or equal to 2/10 to facilitate minimal limitation in daily activity secondary to pain symptoms. Goal status: Ongoing    05/19/2024   2. Patient will demonstrate independent use of home exercise program to facilitate ability to maintain/progress functional gains from skilled physical therapy services. Goal  status:  Ongoing     05/19/2024   3. Patient will demonstrate Patient specific functional scale avg > or = 8/10 to indicate reduced disability due to condition.  Goal status:  Ongoing  05/19/2024   4.  Patient will demonstrate Rt UE MMT 5/5 throughout to facilitate lifting, reaching, carrying at [facility] in daily activity.   Goal status:  Ongoing   05/19/2024   5.  Patient will demonstrate Rt GH joint AROM WFL s symptoms to facilitate usual overhead reaching, self care, dressing at PLOF.    Goal status:  Ongoing  05/19/2024   6.  Patient will  demonstrate/report ability to return to work at [employer].  Goal status: Ongoing   05/19/2024   PLAN:  PT FREQUENCY: 1-2x/week  PT DURATION: 10 weeks  PLANNED INTERVENTIONS: Can include 02853- PT Re-evaluation, 97110-Therapeutic exercises, 97530- Therapeutic activity, 97112- Neuromuscular re-education, 97535- Self Care, 97140- Manual therapy, [phone]- Gait training, [phone]- Orthotic Fit/training, [phone]- Canalith repositioning, [id number]- Aquatic Therapy, [phone]- Electrical stimulation (unattended), [id number] Physical performance testing, 97016- Vasopneumatic device, [id number]- Ultrasound, [id number]- Traction (mechanical), [id number]- Ionotophoresis 4mg /ml Dexamethasone ,  79439 - Needle insertion w/o injection 1 or 2 muscles, 20561 - Needle insertion w/o injection 3 or more muscles.   Patient/Family education, Balance training, Stair training, Taping, Dry Needling, Joint mobilization, Joint manipulation, Spinal manipulation, Spinal mobilization, Scar mobilization, Vestibular training, Visual/preceptual remediation/compensation, DME instructions, Cryotherapy, and Moist heat.  All performed as medically necessary.  All included unless contraindicated  PLAN FOR NEXT SESSION:  AROM, early strengthening to tolerance. Advised more visits scheduling but Pt indicated concerned about return to work and scheduling ability.   8 weeks post surgery on 05/20/2024.   To return to MD in 6 weeks (around 06/17/2024)  Ozell Silvan, PT, DPT, OCS, ATC 05/21/24  8:16 AM

## 2024-05-25 ENCOUNTER — Encounter: Payer: Self-pay | Admitting: Radiology

## 2024-05-26 ENCOUNTER — Ambulatory Visit (INDEPENDENT_AMBULATORY_CARE_PROVIDER_SITE_OTHER)

## 2024-05-26 DIAGNOSIS — R600 Localized edema: Secondary | ICD-10-CM

## 2024-05-26 DIAGNOSIS — R293 Abnormal posture: Secondary | ICD-10-CM | POA: Diagnosis not present

## 2024-05-26 DIAGNOSIS — G8929 Other chronic pain: Secondary | ICD-10-CM | POA: Diagnosis not present

## 2024-05-26 DIAGNOSIS — M6281 Muscle weakness (generalized): Secondary | ICD-10-CM

## 2024-05-26 DIAGNOSIS — M25511 Pain in right shoulder: Secondary | ICD-10-CM | POA: Diagnosis not present

## 2024-05-26 NOTE — Therapy (Addendum)
 " OUTPATIENT PHYSICAL THERAPY  TREATMENT / DISCHARGE    Patient Name: Christina Cannon MRN: [id number] DOB:1965/09/06, 58 y.o., female Today's Date: 05/26/2024  END OF SESSION:  PT End of Session - 05/26/24 1348     Visit Number 12    Number of Visits 20    Date for Recertification  06/15/24    Authorization Type [facility] [employer] $25 copay    PT Start Time 1348    PT Stop Time 1427    PT Time Calculation (min) 39 min    Activity Tolerance Patient tolerated treatment well    Behavior During Therapy [facility] for tasks assessed/performed            Past Medical History:  Diagnosis Date   Diverticulitis    Hypertension    Pre-diabetes    Past Surgical History:  Procedure Laterality Date   BIOPSY  07/19/2022   Procedure: BIOPSY;  Surgeon: Stacia Glendia BRAVO, MD;  Location: [facility];  Service: Gastroenterology;;   CHOLECYSTECTOMY  1988   COLONOSCOPY WITH PROPOFOL  N/A 07/19/2022   Procedure: COLONOSCOPY WITH PROPOFOL ;  Surgeon: Stacia Glendia BRAVO, MD;  Location: [facility];  Service: Gastroenterology;  Laterality: N/A;   HAND SURGERY  1997   POLYPECTOMY N/A 07/19/2022   Procedure: POLYPECTOMY;  Surgeon: Stacia Glendia BRAVO, MD;  Location: [facility];  Service: Gastroenterology;  Laterality: N/A;   SHOULDER ARTHROSCOPY WITH ROTATOR CUFF REPAIR Right 03/25/2024   Procedure: RIGHT SHOULDER ARTHROSCOPY, ROTATOR CUFF REPAIR;  Surgeon: Jerri Kay HERO, MD;  Location: [facility];  Service: Orthopedics;  Laterality: Right;   SUBACROMIAL DECOMPRESSION Right 03/25/2024   Procedure: SUBACROMIAL DECOMPRESSION, EXTENSIVE DEBRIDEMENT;  Surgeon: Jerri Kay HERO, MD;  Location: [facility];  Service: Orthopedics;  Laterality: Right;   Patient Active Problem List   Diagnosis Date Noted   Arthritis of right acromioclavicular joint 03/25/2024   Impingement syndrome of right shoulder 03/25/2024   Traumatic complete tear of right rotator cuff 02/11/2024   Vitamin D   deficiency, unspecified 05/28/2023   Prediabetes 05/28/2023   Polyp of colon 07/19/2022   Prolonged QT interval 12/21/2021   Colitis 12/21/2021   Diverticulitis 12/20/2021   Hypokalemia 12/20/2021   Obesity (BMI 30-39.9) 12/20/2021   Tobacco use 12/20/2021   History of colonic diverticulitis 07/09/2017   Primary osteoarthritis of both knees 07/09/2017   Primary osteoarthritis of both ankles 07/09/2017   Tobacco dependence 07/09/2017   Elevated blood pressure reading 07/09/2017   Leukocytosis 06/10/2017   Skin tag of vulva 08/10/2015    PCP: Vicci Barnie WENDI BARTON PROVIDER: Jule Ronal CROME, PA-C  REFERRING DIAG: [phone] (ICD-10-CM) - S/P rotator cuff repair  THERAPY DIAG:  Chronic right shoulder pain  Muscle weakness (generalized)  Localized edema  Abnormal posture  Rationale for Evaluation and Treatment: Rehabilitation  ONSET DATE: Surgery 03/25/2024  SUBJECTIVE:  SUBJECTIVE STATEMENT: Patient hasn't been able to do exercises at home due to mother-in-law passing. Patient also has been feeling fatigued secondary to receiving flu shot on Friday.   Rt hand dominant.    PERTINENT HISTORY: S/p right shoulder rotator cuff repair on 03/25/2024  Wearing sling for 5 weeks past 04/01/2024 PA visit   PAIN:  NPRS scale: 4-5/10 more soreness than pain   Pain location: Rt shoulder/upper arm Pain description: sharp, dull, achy Aggravating factors: nighttime, any arm movement.  Relieving factors: medicine, resting it   PRECAUTIONS: Shoulder :  Rotator cuff repair.  Wearing sling for 5 weeks past 04/01/2024 PA visit   WEIGHT BEARING RESTRICTIONS: Yes Rt shoulder  FALLS:  Has patient fallen in last 6 months? 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  Plan to get back to work.   PLOF: Independent, recreational softball, basketball, soccer at times.  Cooking, yard work/gardening.   PATIENT GOALS:Reduce pain , get back to work and activity.   OBJECTIVE:   PATIENT SURVEYS:  Patient-Specific Activity Scoring Scheme  0 represents unable to perform. 10 represents able to perform at prior level. 0 1 2 3 4 5 6 7 8 9  10 (Date and Score)   Activity Eval  04/06/2024  05/19/2024  1. Bathing   5  8  2. Comb hair  2  5  3. Cook 2 7  4. sweep 0 9  5. sleep 4 5  Score 2.6 avg 6.8 avg   Total score = sum of the activity scores/number of activities Minimum detectable change (90%CI) for average score = 2 points Minimum detectable change (90%CI) for single activity score = 3 points  COGNITION: 04/06/2024 Overall cognitive status: WFL     SENSATION: 04/06/2024 No specific testing today.   POSTURE: 04/06/2024 Wearing sling, rounded shoulders noted in sitting.   UPPER EXTREMITY ROM:   ROM Right Eval 04/06/2024 PROM Limited by pian, muscle guarding Right 04/23/2024  PROM in supine Right 04/28/2024  Shoulder flexion 100 130 150 AAROM with wand in supine   Shoulder extension     Shoulder abduction 80 110   Shoulder adduction     Shoulder internal rotation  65 in 45 deg abduction    Shoulder external rotation 0 with arm in 20 deg abduction 65 in 45 deg abduction   Elbow flexion     Elbow extension     Wrist flexion     Wrist extension     Wrist ulnar deviation     Wrist radial deviation     Wrist pronation     Wrist supination     (Blank rows = not tested)  UPPER EXTREMITY MMT:  MMT Right Eval 04/06/2024  No testing due to surgery Left Eval 04/06/2024 Right 05/19/2024  Shoulder flexion  5/5 2+/5 (limited in full range against gravity)  Shoulder extension     Shoulder abduction  5/5   Shoulder adduction     Shoulder internal rotation  5/5   Shoulder external rotation  5/5    Middle trapezius     Lower trapezius     Elbow flexion     Elbow extension     Wrist flexion     Wrist extension     Wrist ulnar deviation     Wrist radial deviation     Wrist pronation     Wrist supination     Grip strength (lbs)     (  Blank rows = not tested)  SHOULDER SPECIAL TESTS: 04/06/2024 No specific testing   JOINT MOBILITY TESTING:  04/06/2024 Mid range inferior Rt GH joint passive accessory motion WFL.   PALPATION:  04/06/2024 General tenderness around incision, Rt upper arm.                                                                                                                                                                                                  TODAY'S TREATMENT:                                                              DATE:  05/26/2024 Therex: UBE with bilat UE only level 2.5 for 3.5 min fwd, 3.5 min back Reach to all shelves in cabinet with Rt UE only , 1x10 in flexion, 1x10 in abduction/scaption  ER wallkouts x10 then active ER x10 with red TB  IR walkouts x10 then active IR x10 with green TB Wall circles with pink ball for endurance 2x30s each direction (CW, CCW) AAROM shoulder flexion with 3# bar 3x10 with 2s hold  Sidelying abduction x20     TODAY'S TREATMENT:                                                              DATE:  05/19/2024 Therex: UBE UE only 3 mins each way lvl 2.5 for ROM Sidelying Rt shoulder ER with towel under arm 1 lb x 15  Sidelying Rt shoulder abduction x 15 Sidelying Rt shoulder flexion x 15  Standing wall flexion reach bilateral x 10   Cues for updated HEP with additional printout provided.   Neuro Re-ed (muscle activation ) Standing blue band rows c scapular retraction focus 2 x 15 Standing blue band GH extension 2 x 15 Standing green band Rt shoulder ER walk outs with towel under arm 5 sec hold x 10    TODAY'S TREATMENT:  DATE: 05/14/24 R  shoulder TherEx:  Pulley into shoulder flexion 3 minutes with 3s holds, shoulder scaption 3 minutes with 3s holds  Wall ladder  8x Standing shoulder isometrics flex, abd, IR 1x15 with 5s hold  Supine AAROM shoulder flexion with PVC 2x10 with 2-3s hold  Supine AROM shoulder abduction 2x10  Seated shrugs /retractions 20x Manual - scap glides  Vaso:  Seated Rt shoulder with arm supported for 10 minutes with medium compression at 34deg    TODAY'S TREATMENT:                                                              DATE:  05/12/24 TherEx:  Pulley into shoulder flexion 3 minutes with 3s holds, shoulder scaption 3 minutes with 3s holds  Standing shoulder flexion isometrics 1x15 with 5s hold  Standing shoulder abduction isometrics 1x12 with 5s hold  Supine AAROM shoulder flexion with PVC 2x15 with 2-3s hold  Supine AROM shoulder abduction 2x10  Manual - scap glides  Vaso:  Seated Rt shoulder with arm supported for 10 minutes with medium compression at 34deg     PATIENT EDUCATION: 04/30/2024 Education details: HEP update Person educated: Patient Education method: Programmer, Multimedia, Facilities Manager, Verbal cues, and Handouts Education comprehension: verbalized understanding, returned demonstration, and verbal cues required  HOME EXERCISE PROGRAM: Access Code: [email or web address] URL: [email or web address] Date: 05/19/2024 Prepared by: Ozell Silvan  Exercises - Seated Scapular Retraction  - 3-5 x daily - 7 x weekly - 1 sets - 10 reps - 3-5 hold - Supine Shoulder External Rotation with Dowel at 20 Degrees of Abduction  - 2-3 x daily - 7 x weekly - 1 sets - 10 reps - 5 hold - Sidelying Shoulder Abduction Palm Forward  - 1-2 x daily - 7 x weekly - 2-3 sets - 10-15 reps - Sidelying Shoulder External Rotation (Mirrored)  - 1-2 x daily - 7 x weekly - 2-3 sets - 10-15 reps - Sidelying Shoulder Flexion 15 Degrees (Mirrored)  - 1-2 x daily - 7 x weekly - 2-3 sets - 10-15 reps - Standing  Bilateral Low Shoulder Row with Anchored Resistance  - 1-2 x daily - 7 x weekly - 2-3 sets - 10-15 reps - Shoulder Extension with Resistance  - 1-2 x daily - 7 x weekly - 1-2 sets - 10-15 reps - Standing shoulder flexion wall slides  - 1-2 x daily - 7 x weekly - 1 sets - 10 reps - 5 hold   ASSESSMENT:  CLINICAL IMPRESSION: Patient arrived to session noting personal life being busy/difficult leading to decreased HEP compliance. Though patient has had decreased HEP compliance, she tolerated all activities this date with only one instance of increased soreness/pain secondary to form breakdown. Patient will continue to benefit from skilled PT.  OBJECTIVE IMPAIRMENTS: decreased activity tolerance, decreased coordination, decreased endurance, decreased mobility, decreased ROM, decreased strength, hypomobility, increased edema, increased fascial restrictions, impaired perceived functional ability, increased muscle spasms, impaired flexibility, impaired UE functional use, improper body mechanics, postural dysfunction, and pain.   ACTIVITY LIMITATIONS: carrying, lifting, sleeping, transfers, bed mobility, bathing, toileting, dressing, self feeding, reach over head, hygiene/grooming, and caring for others  PARTICIPATION LIMITATIONS: meal prep, cleaning, laundry, interpersonal relationship, driving, shopping, community activity, occupation, and yard work  PERSONAL FACTORS: No specific factors are  affecting patient's functional outcome.   REHAB POTENTIAL: Good  CLINICAL DECISION MAKING: Stable/uncomplicated  EVALUATION COMPLEXITY: Low   GOALS: Goals reviewed with patient? Yes  SHORT TERM GOALS: (target date for Short term goals are 3 weeks 04/27/2024)  1.Patient will demonstrate independent use of home exercise program to maintain progress from in clinic treatments. Goal status:  Ongoing     04/15/2024  LONG TERM GOALS: (target dates for all long term goals are 10 weeks  06/15/2024 )   1.  Patient will demonstrate/report pain at worst less than or equal to 2/10 to facilitate minimal limitation in daily activity secondary to pain symptoms. Goal status: Ongoing    05/19/2024   2. Patient will demonstrate independent use of home exercise program to facilitate ability to maintain/progress functional gains from skilled physical therapy services. Goal status:  Ongoing     05/19/2024   3. Patient will demonstrate Patient specific functional scale avg > or = 8/10 to indicate reduced disability due to condition.  Goal status:  Ongoing  05/19/2024   4.  Patient will demonstrate Rt UE MMT 5/5 throughout to facilitate lifting, reaching, carrying at [facility] in daily activity.   Goal status:  Ongoing   05/19/2024   5.  Patient will demonstrate Rt GH joint AROM WFL s symptoms to facilitate usual overhead reaching, self care, dressing at PLOF.    Goal status:  Ongoing  05/19/2024   6.  Patient will demonstrate/report ability to return to work at [employer].  Goal status: Ongoing   05/19/2024   PLAN:  PT FREQUENCY: 1-2x/week  PT DURATION: 10 weeks  PLANNED INTERVENTIONS: Can include 02853- PT Re-evaluation, 97110-Therapeutic exercises, 97530- Therapeutic activity, 97112- Neuromuscular re-education, 97535- Self Care, 97140- Manual therapy, [phone]- Gait training, [phone]- Orthotic Fit/training, [phone]- Canalith repositioning, [id number]- Aquatic Therapy, [phone]- Electrical stimulation (unattended), [id number] Physical performance testing, 97016- Vasopneumatic device, [id number]- Ultrasound, [id number]- Traction (mechanical), [id number]- Ionotophoresis 4mg /ml Dexamethasone ,  [id number] - Needle insertion w/o injection 1 or 2 muscles, 20561 - Needle insertion w/o injection 3 or more muscles.   Patient/Family education, Balance training, Stair training, Taping, Dry Needling, Joint mobilization, Joint manipulation, Spinal manipulation, Spinal mobilization, Scar mobilization, Vestibular training, Visual/preceptual remediation/compensation,  DME instructions, Cryotherapy, and Moist heat.  All performed as medically necessary.  All included unless contraindicated  PLAN FOR NEXT SESSION:  AROM, early strengthening to tolerance. Advised more visits scheduling but Pt indicated concerned about return to work and scheduling ability.   8 weeks post surgery on 05/20/2024.   To return to MD in 6 weeks (around 06/17/2024)  PHYSICAL THERAPY DISCHARGE SUMMARY  Visits from Start of Care: 12  Current functional level related to goals / functional outcomes: See above    Remaining deficits: See above    Education / Equipment: HEP   Patient agrees to discharge. Patient goals were partially met. Patient is being discharged due to not returning since the last visit.   Susannah Daring, PT, DPT 05/26/24 3:32 PM       "

## 2024-05-28 NOTE — Therapy (Incomplete)
 OUTPATIENT PHYSICAL THERAPY  TREATMENT   Patient Name: Christina Cannon MRN: [id number] DOB:[birth year], 58 y.o., female Today's Date: 05/28/2024  END OF SESSION:      Past Medical History:  Diagnosis Date   Diverticulitis    Hypertension    Pre-diabetes    Past Surgical History:  Procedure Laterality Date   BIOPSY  07/19/2022   Procedure: BIOPSY;  Surgeon: Stacia Glendia BRAVO, MD;  Location: [facility];  Service: Gastroenterology;;   CHOLECYSTECTOMY  1988   COLONOSCOPY WITH PROPOFOL  N/A 07/19/2022   Procedure: COLONOSCOPY WITH PROPOFOL ;  Surgeon: Stacia Glendia BRAVO, MD;  Location: [facility];  Service: Gastroenterology;  Laterality: N/A;   HAND SURGERY  1997   POLYPECTOMY N/A 07/19/2022   Procedure: POLYPECTOMY;  Surgeon: Stacia Glendia BRAVO, MD;  Location: [facility];  Service: Gastroenterology;  Laterality: N/A;   SHOULDER ARTHROSCOPY WITH ROTATOR CUFF REPAIR Right 03/25/2024   Procedure: RIGHT SHOULDER ARTHROSCOPY, ROTATOR CUFF REPAIR;  Surgeon: Jerri Kay HERO, MD;  Location: [facility];  Service: Orthopedics;  Laterality: Right;   SUBACROMIAL DECOMPRESSION Right 03/25/2024   Procedure: SUBACROMIAL DECOMPRESSION, EXTENSIVE DEBRIDEMENT;  Surgeon: Jerri Kay HERO, MD;  Location: [facility];  Service: Orthopedics;  Laterality: Right;   Patient Active Problem List   Diagnosis Date Noted   Arthritis of right acromioclavicular joint 03/25/2024   Impingement syndrome of right shoulder 03/25/2024   Traumatic complete tear of right rotator cuff 02/11/2024   Vitamin D  deficiency, unspecified 05/28/2023   Prediabetes 05/28/2023   Polyp of colon 07/19/2022   Prolonged QT interval 12/21/2021   Colitis 12/21/2021   Diverticulitis 12/20/2021   Hypokalemia 12/20/2021   Obesity (BMI 30-39.9) 12/20/2021   Tobacco use 12/20/2021   History of colonic diverticulitis 07/09/2017   Primary osteoarthritis of both knees 07/09/2017   Primary osteoarthritis  of both ankles 07/09/2017   Tobacco dependence 07/09/2017   Elevated blood pressure reading 07/09/2017   Leukocytosis 06/10/2017   Skin tag of vulva 08/10/2015    PCP: Vicci Barnie WENDI BARTON PROVIDER: Jule Ronal CROME, PA-C  REFERRING DIAG: [phone] (ICD-10-CM) - S/P rotator cuff repair  THERAPY DIAG:  No diagnosis found.  Rationale for Evaluation and Treatment: Rehabilitation  ONSET DATE: Surgery 03/25/2024  SUBJECTIVE:                                                                                                                                                                                      SUBJECTIVE STATEMENT: *** Patient hasn't been able to do exercises at home due to mother-in-law passing. Patient also has been feeling fatigued secondary to receiving flu shot on Friday.  Rt hand dominant.    PERTINENT HISTORY: S/p right shoulder rotator cuff repair on 03/25/2024  Wearing sling for 5 weeks past 04/01/2024 PA visit   PAIN:  NPRS scale: 4-5/10 more soreness than pain   Pain location: Rt shoulder/upper arm Pain description: sharp, dull, achy Aggravating factors: nighttime, any arm movement.  Relieving factors: medicine, resting it   PRECAUTIONS: Shoulder :  Rotator cuff repair.  Wearing sling for 5 weeks past 04/01/2024 PA visit   WEIGHT BEARING RESTRICTIONS: Yes Rt shoulder  FALLS:  Has patient fallen in last 6 months? 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  Plan to get back to work.   PLOF: Independent, recreational softball, basketball, soccer at times.  Cooking, yard work/gardening.   PATIENT GOALS:Reduce pain , get back to work and activity.   OBJECTIVE:   PATIENT SURVEYS:  Patient-Specific Activity Scoring Scheme  0 represents "unable to perform." 10 represents "able to perform at prior level. 0 1 2 3 4 5 6 7 8 9  10 (Date and Score)   Activity Eval   04/06/2024  05/19/2024  1. Bathing   5  8  2. Comb hair  2  5  3. Cook 2 7  4. sweep 0 9  5. sleep 4 5  Score 2.6 avg 6.8 avg   Total score = sum of the activity scores/number of activities Minimum detectable change (90%CI) for average score = 2 points Minimum detectable change (90%CI) for single activity score = 3 points  COGNITION: 04/06/2024 Overall cognitive status: WFL     SENSATION: 04/06/2024 No specific testing today.   POSTURE: 04/06/2024 Wearing sling, rounded shoulders noted in sitting.   UPPER EXTREMITY ROM:   ROM Right Eval 04/06/2024 PROM Limited by pian, muscle guarding Right 04/23/2024  PROM in supine Right 04/28/2024  Shoulder flexion 100 130 150 AAROM with wand in supine   Shoulder extension     Shoulder abduction 80 110   Shoulder adduction     Shoulder internal rotation  65 in 45 deg abduction    Shoulder external rotation 0 with arm in 20 deg abduction 65 in 45 deg abduction   Elbow flexion     Elbow extension     Wrist flexion     Wrist extension     Wrist ulnar deviation     Wrist radial deviation     Wrist pronation     Wrist supination     (Blank rows = not tested)  UPPER EXTREMITY MMT:  MMT Right Eval 04/06/2024  No testing due to surgery Left Eval 04/06/2024 Right 05/19/2024  Shoulder flexion  5/5 2+/5 (limited in full range against gravity)  Shoulder extension     Shoulder abduction  5/5   Shoulder adduction     Shoulder internal rotation  5/5   Shoulder external rotation  5/5   Middle trapezius     Lower trapezius     Elbow flexion     Elbow extension     Wrist flexion     Wrist extension     Wrist ulnar deviation     Wrist radial deviation     Wrist pronation     Wrist supination     Grip strength (lbs)     (Blank rows = not tested)  SHOULDER SPECIAL TESTS: 04/06/2024 No specific testing   JOINT MOBILITY TESTING:  04/06/2024 Mid range inferior Rt GH joint passive accessory motion WFL.  PALPATION:   04/06/2024 General tenderness around incision, Rt upper arm.                                                                                                                                                                                                  TODAY'S TREATMENT:                                                              DATE:  05/29/2024 ***   TODAY'S TREATMENT:                                                              DATE:  05/26/2024 Therex: UBE with bilat UE only level 2.5 for 3.5 min fwd, 3.5 min back Reach to all shelves in cabinet with Rt UE only , 1x10 in flexion, 1x10 in abduction/scaption  ER wallkouts x10 then active ER x10 with red TB  IR walkouts x10 then active IR x10 with green TB Wall circles with pink ball for endurance 2x30s each direction (CW, CCW) AAROM shoulder flexion with 3# bar 3x10 with 2s hold  Sidelying abduction x20     TODAY'S TREATMENT:                                                              DATE:  05/19/2024 Therex: UBE UE only 3 mins each way lvl 2.5 for ROM Sidelying Rt shoulder ER with towel under arm 1 lb x 15  Sidelying Rt shoulder abduction x 15 Sidelying Rt shoulder flexion x 15  Standing wall flexion reach bilateral x 10   Cues for updated HEP with additional printout provided.   Neuro Re-ed (muscle activation ) Standing blue band rows c scapular retraction focus 2 x 15 Standing blue band GH extension 2 x 15 Standing green band Rt shoulder ER walk outs with towel under arm 5 sec hold x 10    TODAY'S TREATMENT:  DATE: 05/14/24 R shoulder TherEx:  Pulley into shoulder flexion 3 minutes with 3s holds, shoulder scaption 3 minutes with 3s holds  Wall ladder  8x Standing shoulder isometrics flex, abd, IR 1x15 with 5s hold  Supine AAROM shoulder flexion with PVC 2x10 with 2-3s hold  Supine AROM shoulder abduction 2x10  Seated shrugs /retractions 20x Manual - scap  glides  Vaso:  Seated Rt shoulder with arm supported for 10 minutes with medium compression at 34deg      PATIENT EDUCATION: 04/30/2024 Education details: HEP update Person educated: Patient Education method: Programmer, Multimedia, Facilities Manager, Verbal cues, and Handouts Education comprehension: verbalized understanding, returned demonstration, and verbal cues required  HOME EXERCISE PROGRAM: Access Code: [email or web address] URL: [email or web address] Date: 05/19/2024 Prepared by: Ozell Silvan  Exercises - Seated Scapular Retraction  - 3-5 x daily - 7 x weekly - 1 sets - 10 reps - 3-5 hold - Supine Shoulder External Rotation with Dowel at 20 Degrees of Abduction  - 2-3 x daily - 7 x weekly - 1 sets - 10 reps - 5 hold - Sidelying Shoulder Abduction Palm Forward  - 1-2 x daily - 7 x weekly - 2-3 sets - 10-15 reps - Sidelying Shoulder External Rotation (Mirrored)  - 1-2 x daily - 7 x weekly - 2-3 sets - 10-15 reps - Sidelying Shoulder Flexion 15 Degrees (Mirrored)  - 1-2 x daily - 7 x weekly - 2-3 sets - 10-15 reps - Standing Bilateral Low Shoulder Row with Anchored Resistance  - 1-2 x daily - 7 x weekly - 2-3 sets - 10-15 reps - Shoulder Extension with Resistance  - 1-2 x daily - 7 x weekly - 1-2 sets - 10-15 reps - Standing shoulder flexion wall slides  - 1-2 x daily - 7 x weekly - 1 sets - 10 reps - 5 hold   ASSESSMENT:  CLINICAL IMPRESSION: *** Patient arrived to session noting personal life being busy/difficult leading to decreased HEP compliance. Though patient has had decreased HEP compliance, she tolerated all activities this date with only one instance of increased soreness/pain secondary to form breakdown. Patient will continue to benefit from skilled PT.  OBJECTIVE IMPAIRMENTS: decreased activity tolerance, decreased coordination, decreased endurance, decreased mobility, decreased ROM, decreased strength, hypomobility, increased edema, increased fascial restrictions, impaired  perceived functional ability, increased muscle spasms, impaired flexibility, impaired UE functional use, improper body mechanics, postural dysfunction, and pain.   ACTIVITY LIMITATIONS: carrying, lifting, sleeping, transfers, bed mobility, bathing, toileting, dressing, self feeding, reach over head, hygiene/grooming, and caring for others  PARTICIPATION LIMITATIONS: meal prep, cleaning, laundry, interpersonal relationship, driving, shopping, community activity, occupation, and yard work  PERSONAL FACTORS: No specific factors are affecting patient's functional outcome.   REHAB POTENTIAL: Good  CLINICAL DECISION MAKING: Stable/uncomplicated  EVALUATION COMPLEXITY: Low   GOALS: Goals reviewed with patient? Yes  SHORT TERM GOALS: (target date for Short term goals are 3 weeks 04/27/2024)  1.Patient will demonstrate independent use of home exercise program to maintain progress from in clinic treatments. Goal status:  Ongoing     04/15/2024  LONG TERM GOALS: (target dates for all long term goals are 10 weeks  06/15/2024 )   1. Patient will demonstrate/report pain at worst less than or equal to 2/10 to facilitate minimal limitation in daily activity secondary to pain symptoms. Goal status: Ongoing    05/19/2024   2. Patient will demonstrate independent use of home exercise program to facilitate ability to maintain/progress functional gains from skilled physical therapy services. Goal status:  Ongoing     05/19/2024   3. Patient will demonstrate Patient specific functional scale avg > or = 8/10 to indicate reduced disability due to condition.  Goal status:  Ongoing  05/19/2024   4.  Patient will demonstrate Rt UE MMT 5/5 throughout to facilitate lifting, reaching, carrying at [facility] in daily activity.   Goal status:  Ongoing   05/19/2024   5.  Patient will demonstrate Rt GH joint AROM WFL s symptoms to facilitate usual overhead reaching, self care, dressing at PLOF.    Goal status:   Ongoing  05/19/2024   6.  Patient will demonstrate/report ability to return to work at [employer].  Goal status: Ongoing   05/19/2024   PLAN:  PT FREQUENCY: 1-2x/week  PT DURATION: 10 weeks  PLANNED INTERVENTIONS: Can include 02853- PT Re-evaluation, 97110-Therapeutic exercises, 97530- Therapeutic activity, 97112- Neuromuscular re-education, 97535- Self Care, 97140- Manual therapy, [phone]- Gait training, [phone]- Orthotic Fit/training, [phone]- Canalith repositioning, [id number]- Aquatic Therapy, [phone]- Electrical stimulation (unattended), [id number] Physical performance testing, 97016- Vasopneumatic device, [id number]- Ultrasound, [id number]- Traction (mechanical), [id number]- Ionotophoresis 4mg /ml Dexamethasone ,  79439 - Needle insertion w/o injection 1 or 2 muscles, 20561 - Needle insertion w/o injection 3 or more muscles.   Patient/Family education, Balance training, Stair training, Taping, Dry Needling, Joint mobilization, Joint manipulation, Spinal manipulation, Spinal mobilization, Scar mobilization, Vestibular training, Visual/preceptual remediation/compensation, DME instructions, Cryotherapy, and Moist heat.  All performed as medically necessary.  All included unless contraindicated  PLAN FOR NEXT SESSION:  *** AROM, early strengthening to tolerance. Advised more visits scheduling but Pt indicated concerned about return to work and scheduling ability.   8 weeks post surgery on 05/20/2024.   To return to MD in 6 weeks (around 06/17/2024)  Susannah Daring, PT, DPT 05/28/24 7:57 AM

## 2024-05-29 ENCOUNTER — Telehealth: Payer: Self-pay

## 2024-05-29 ENCOUNTER — Encounter

## 2024-05-29 NOTE — Telephone Encounter (Signed)
 Patient was a no-show for today's appointment at 1300. PT called patient and left a voicemail to remind her that it was her last scheduled appointment and to call if she wanted to schedule any more sessions.    Susannah Daring, PT, DPT 05/29/24 1:33 PM

## 2024-07-03 ENCOUNTER — Emergency Department (HOSPITAL_COMMUNITY)

## 2024-07-03 ENCOUNTER — Encounter (HOSPITAL_COMMUNITY): Payer: Self-pay

## 2024-07-03 ENCOUNTER — Other Ambulatory Visit: Payer: Self-pay

## 2024-07-03 ENCOUNTER — Emergency Department (HOSPITAL_COMMUNITY)
Admission: EM | Admit: 2024-07-03 | Discharge: 2024-07-03 | Disposition: A | Discharge status: Home / Self Care | Attending: Emergency Medicine | Admitting: Emergency Medicine

## 2024-07-03 DIAGNOSIS — H538 Other visual disturbances: Secondary | ICD-10-CM | POA: Diagnosis not present

## 2024-07-03 DIAGNOSIS — R42 Dizziness and giddiness: Secondary | ICD-10-CM | POA: Insufficient documentation

## 2024-07-03 DIAGNOSIS — R202 Paresthesia of skin: Secondary | ICD-10-CM | POA: Diagnosis not present

## 2024-07-03 DIAGNOSIS — R531 Weakness: Secondary | ICD-10-CM | POA: Insufficient documentation

## 2024-07-03 DIAGNOSIS — F17200 Nicotine dependence, unspecified, uncomplicated: Secondary | ICD-10-CM | POA: Diagnosis not present

## 2024-07-03 DIAGNOSIS — I10 Essential (primary) hypertension: Secondary | ICD-10-CM | POA: Insufficient documentation

## 2024-07-03 DIAGNOSIS — R519 Headache, unspecified: Secondary | ICD-10-CM | POA: Diagnosis not present

## 2024-07-03 DIAGNOSIS — I6782 Cerebral ischemia: Secondary | ICD-10-CM | POA: Diagnosis not present

## 2024-07-03 DIAGNOSIS — Z79899 Other long term (current) drug therapy: Secondary | ICD-10-CM | POA: Diagnosis not present

## 2024-07-03 DIAGNOSIS — R791 Abnormal coagulation profile: Secondary | ICD-10-CM | POA: Diagnosis not present

## 2024-07-03 LAB — COMPREHENSIVE METABOLIC PANEL WITH GFR
ALT: 14 U/L (ref 0–44)
AST: 19 U/L (ref 15–41)
Albumin: 3.4 g/dL — ABNORMAL LOW (ref 3.5–5.0)
Alkaline Phosphatase: 57 U/L (ref 38–126)
Anion gap: 8 (ref 5–15)
BUN: 7 mg/dL (ref 6–20)
CO2: 24 mmol/L (ref 22–32)
Calcium: 8.3 mg/dL — ABNORMAL LOW (ref 8.9–10.3)
Chloride: 106 mmol/L (ref 98–111)
Creatinine, Ser: 0.96 mg/dL (ref 0.44–1.00)
GFR, Estimated: >60 mL/min (ref >60)
Glucose, Bld: 156 mg/dL — ABNORMAL HIGH (ref 70–99)
Potassium: 3.3 mmol/L — ABNORMAL LOW (ref 3.5–5.1)
Sodium: 138 mmol/L (ref 135–145)
Total Bilirubin: 0.7 mg/dL (ref 0.0–1.2)
Total Protein: 6.8 g/dL (ref 6.5–8.1)

## 2024-07-03 LAB — DIFFERENTIAL
Abs Immature Granulocytes: 0.06 K/uL (ref 0.00–0.07)
Basophils Absolute: 0 K/uL (ref 0.0–0.1)
Basophils Relative: 0 %
Eosinophils Absolute: 0.1 K/uL (ref 0.0–0.5)
Eosinophils Relative: 1 %
Immature Granulocytes: 1 %
Lymphocytes Relative: 25 %
Lymphs Abs: 2.1 K/uL (ref 0.7–4.0)
Monocytes Absolute: 0.4 K/uL (ref 0.1–1.0)
Monocytes Relative: 5 %
Neutro Abs: 5.5 K/uL (ref 1.7–7.7)
Neutrophils Relative %: 68 %

## 2024-07-03 LAB — CBC
HCT: 44.1 % (ref 36.0–46.0)
Hemoglobin: 14.8 g/dL (ref 12.0–15.0)
MCH: 29.7 pg (ref 26.0–34.0)
MCHC: 33.6 g/dL (ref 30.0–36.0)
MCV: 88.6 fL (ref 80.0–100.0)
Platelets: 198 K/uL (ref 150–400)
RBC: 4.98 MIL/uL (ref 3.87–5.11)
RDW: 13.9 % (ref 11.5–15.5)
WBC: 8.1 K/uL (ref 4.0–10.5)
nRBC: 0 % (ref 0.0–0.2)

## 2024-07-03 LAB — RAPID URINE DRUG SCREEN, HOSP PERFORMED
Amphetamines: NOT DETECTED
Barbiturates: NOT DETECTED
Benzodiazepines: NOT DETECTED
Cocaine: NOT DETECTED
Opiates: NOT DETECTED
Tetrahydrocannabinol: POSITIVE — AB

## 2024-07-03 LAB — APTT: aPTT: 27 s (ref 24–36)

## 2024-07-03 LAB — PROTIME-INR
INR: 1 (ref 0.8–1.2)
Prothrombin Time: 13.8 s (ref 11.4–15.2)

## 2024-07-03 LAB — ETHANOL: Alcohol, Ethyl (B): <15 mg/dL (ref <15)

## 2024-07-03 LAB — CBG MONITORING, ED: Glucose-Capillary: 172 mg/dL — ABNORMAL HIGH (ref 70–99)

## 2024-07-03 MED ORDER — ACETAMINOPHEN 500 MG PO TABS
1000.0000 mg | ORAL_TABLET | Freq: Once | ORAL | Status: AC
  Administered 2024-07-03: 1000 mg via ORAL
  Filled 2024-07-03: qty 2

## 2024-07-03 NOTE — ED Provider Notes (Signed)
 Received patient in signout from patient provider pending neurology consult, possible admission for TIA workup.  See their note.  In short, patient presents to Emergency Department for evaluation of headache, dizziness, blurred vision, right arm paresthesia when she woke up this morning at 0500.  Last known well at 11 PM.  Neurological exam performed by previous provider notable for 4+/5 RUE motor. Increased difficulty with finger to nose test on L vs R. No sensory deficits.  No visual nor speech disturbances. CN intact  Was provided Tylenol  for headache  Labs notable for mild hypokalemia 3.3, UDS +for THC, CBG 172. 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  Consulted neurology Dr. Voncile who individually reviewed MRI. ABCD score 3. He recommends outpatient f/u with neurology.   Physical Exam  BP (!) 155/90 (BP Location: Left Arm)   Pulse 77   Temp 98.1 F (36.7 C) (Oral)   Resp 16   Ht 5' 7 (1.702 m)   Wt 101.2 kg   LMP 05/29/2019   SpO2 100%   BMI 34.93 kg/m   Physical Exam Vitals and nursing note reviewed.  Constitutional:      General: She is not in acute distress.    Appearance: Normal appearance.  HENT:     Head: Normocephalic and atraumatic.  Eyes:     General: Lids are normal. Vision grossly intact. No visual field deficit.    Extraocular Movements:     Right eye: Normal extraocular motion and no nystagmus.     Left eye: Normal extraocular motion and no nystagmus.     Conjunctiva/sclera: Conjunctivae normal.  Cardiovascular:     Rate and Rhythm: Normal rate.  Pulmonary:     Effort: Pulmonary effort is normal. No respiratory distress.  Musculoskeletal:     Cervical back: Normal range of motion and neck supple. No rigidity.  Skin:    Coloration: Skin is not jaundiced or pale.  Neurological:     Mental Status: She is alert and  oriented to person, place, and time. Mental status is at baseline.     GCS: GCS eye subscore is 4. GCS verbal subscore is 5. GCS motor subscore is 6.     Cranial Nerves: No cranial nerve deficit, dysarthria or facial asymmetry.     Sensory: Sensation is intact. No sensory deficit.     Motor: No weakness, tremor, atrophy, abnormal muscle tone, seizure activity or pronator drift.     Coordination: Coordination normal. Finger-Nose-Finger Test and Heel to Chandler Test normal.     Gait: Gait is intact.     Comments: Motor 5/5 and sensation 2/2 BUE and BLE.  No slurred speech or aphasia.    ED Course / MDM    Medical Decision Making Risk OTC drugs.   On reassessment, patient is at her baseline.  She has no neurosymptoms currently.  She is able to ambulate without difficulty.  Her blood pressure has improved from 163-138 systolic.  Headache is improved from Tylenol . MRI without CVA patient is agreeable to discharge and ready to be discharged. Symptoms could be related to HTN vs complex migraine. I will provide her with neurology follow-up for further management  Discussed ED workup, disposition, return to ED precautions with patient who expresses understanding agrees with plan.  All questions answered to their satisfaction.  They are agreeable to plan.  Discharge instructions provided on paperwork  Minnie Tinnie BRAVO, PA 07/03/24 1739    Ellouise Fine K, DO 07/03/24 1950

## 2024-07-03 NOTE — ED Provider Triage Note (Signed)
 Emergency Medicine Provider Triage Evaluation Note  Christina Cannon , a 58 y.o. female  was evaluated in triage.  Pt complains of weakness of the right upper extremity with associated headache.  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  She has never had anything like this in the past.  Review of Systems  Positive: Weakness, tingling, headache Negative: Chest pain, shortness of breath, fevers, chills  Physical Exam  BP (!) 163/82   Pulse 78   Temp (!) 97.5 F (36.4 C)   Resp 17   Ht 1.702 m (5' 7)   Wt 101.2 kg   LMP 05/29/2019   SpO2 96%   BMI 34.93 kg/m  Gen:   Awake, no distress no facial droop Eyes: Has some conjunctival hemorrhage on the right Resp:  Normal effort clear lungs, clear heart sounds, normal pulses MSK:   Moves extremities without difficulty  Other:  Neurologic exam is significant for some right upper extremity subtle weakness, there is no decrease in sensation, she is able to perform finger-nose-finger better on the left than she can on the right, cranial nerves III through XII are normal, gross visual acuity is normal, peripheral visual fields appear normal, bilateral lower extremities with normal strength and sensation  Medical Decision Making  Medically screening exam initiated at 9:33 AM.  Appropriate orders placed.  Christina Cannon was informed that the remainder of the evaluation will be completed by another provider, this initial triage assessment does not replace that evaluation, and the importance of remaining in  the ED until their evaluation is complete.  Patient awoke this morning with focal neurologic deficits, last seen normal would have been around 11:00 last night.  Subtle weakness of the right upper extremity, will discuss with neurology, stroke possible, ordered labs, the patient is not a TNK candidate and does not appear to have an LVO.   Cleotilde Rogue, MD 07/03/24 [phone]

## 2024-07-03 NOTE — ED Triage Notes (Signed)
 Pt c.o headache, dizziness, blurred vision and right arm tingling. Pt went to bed around 1100pm and woke up around 0500 this morning. Pt had recent shoulder sx in September, took some ibuprofen . Pt has not taken her BP meds today.

## 2024-07-03 NOTE — ED Provider Notes (Signed)
 [facility] AT [facility] Provider Note   CSN: [id number] Arrival date & time: 07/03/24  9155     Patient presents with: Dizziness, Headache, and Numbness   Christina Cannon is a 58 y.o. female with past medical history as needed for hypertension, prediabetes, diverticulitis, tobacco use who presents with concern for headache, dizziness, blurred vision, right arm tingling when she woke up this morning.  Last known well 11 PM.  Woke up at 5 AM this morning.  She did recently have shoulder surgery. Also with decreased sensation on the right.    Dizziness Associated symptoms: headaches   Headache Associated symptoms: dizziness        Prior to Admission medications  Medication Sig Start Date End Date Taking? Authorizing Provider  HYDROcodone -acetaminophen  (NORCO/VICODIN) 5-325 MG tablet Take 1 tablet by mouth 3 (three) times daily as needed. To be taken after surgery 03/19/24   Jule Ronal CROME, PA-C  methocarbamol  (ROBAXIN ) 500 MG tablet Take 1 tablet (500 mg total) by mouth 2 (two) times daily as needed. 03/19/24   Jule Ronal CROME, PA-C  acetaminophen -codeine  (TYLENOL  #3) 300-30 MG tablet Take 1-2 tablets by mouth at bedtime as needed for pain. 01/16/24   Vicci Barnie NOVAK, MD  amLODipine  (NORVASC ) 10 MG tablet Take 1 tablet (10 mg total) by mouth daily. 01/16/24   Vicci Barnie NOVAK, MD  diclofenac  (VOLTAREN ) 75 MG EC tablet Take 1 tablet (75 mg total) by mouth 2 (two) times daily as needed. 04/01/24   Jule Ronal CROME, PA-C  HYDROcodone -acetaminophen  (NORCO) 7.5-325 MG tablet Take 1 tablet by mouth every 6 (six) hours as needed for moderate pain (pain score 4-6). 04/01/24   Jule Ronal CROME, PA-C  ibuprofen  (ADVIL ) 800 MG tablet Take 1 tablet (800 mg total) by mouth 2 (two) times daily as needed. Take with food 01/16/24   Vicci Barnie NOVAK, MD  methocarbamol  (ROBAXIN ) 750 MG tablet Take 1 tablet (750 mg total) by mouth 3 (three) times daily as needed.  04/01/24   Jule Ronal CROME, PA-C  traMADol  (ULTRAM ) 50 MG tablet Take 1-2 tablets (50-100 mg total) by mouth daily as needed. 02/11/24   Jerri Kay HERO, MD  Vitamin D , Ergocalciferol , (DRISDOL ) 1.25 MG (50000 UNIT) CAPS capsule Take 1 capsule (50,000 Units total) by mouth every 7 (seven) days. 01/16/24   Vicci Barnie NOVAK, MD    Allergies: Ceftin [cefuroxime axetil]    Review of Systems  Neurological:  Positive for dizziness and headaches.  All other systems reviewed and are negative.   Updated Vital Signs BP (!) 155/90 (BP Location: Left Arm)   Pulse 77   Temp 98.1 F (36.7 C) (Oral)   Resp 16   Ht 5' 7 (1.702 m)   Wt 101.2 kg   LMP 05/29/2019   SpO2 100%   BMI 34.93 kg/m   Physical Exam Vitals and nursing note reviewed.  Constitutional:      General: She is not in acute distress.    Appearance: Normal appearance.  HENT:     Head: Normocephalic and atraumatic.  Eyes:     General:        Right eye: No discharge.        Left eye: No discharge.  Cardiovascular:     Rate and Rhythm: Normal rate and regular rhythm.     Heart sounds: No murmur heard.    No friction rub. No gallop.  Pulmonary:     Effort: Pulmonary effort is normal.  Breath sounds: Normal breath sounds.  Abdominal:     General: Bowel sounds are normal.     Palpations: Abdomen is soft.  Skin:    General: Skin is warm and dry.     Capillary Refill: Capillary refill takes less than 2 seconds.  Neurological:     Mental Status: She is alert and oriented to person, place, and time.     Comments: Neurologic exam is significant for some right upper extremity subtle weakness, there is no decrease in sensation, she is able to perform finger-nose-finger better on the left than she can on the right, cranial nerves III through XII are normal, gross visual acuity is normal, peripheral visual fields appear normal, bilateral lower extremities with normal strength and sensation  Psychiatric:        Mood and Affect:  Mood normal.        Behavior: Behavior normal.     (all labs ordered are listed, but only abnormal results are displayed) Labs Reviewed  COMPREHENSIVE METABOLIC PANEL WITH GFR - Abnormal; Notable for the following components:      Result Value   Potassium 3.3 (*)    Glucose, Bld 156 (*)    Calcium 8.3 (*)    Albumin 3.4 (*)    All other components within normal limits  RAPID URINE DRUG SCREEN, HOSP PERFORMED - Abnormal; Notable for the following components:   Tetrahydrocannabinol POSITIVE (*)    All other components within normal limits  CBG MONITORING, ED - Abnormal; Notable for the following components:   Glucose-Capillary 172 (*)    All other components within normal limits  PROTIME-INR  APTT  CBC  DIFFERENTIAL  ETHANOL  CBG MONITORING, ED    EKG: EKG Interpretation Date/Time:  Friday July 03 2024 09:38:23 EST Ventricular Rate:  90 PR Interval:  134 QRS Duration:  78 QT Interval:  386 QTC Calculation: 472 R Axis:   -1  Text Interpretation: Sinus rhythm with occasional Premature ventricular complexes Minimal voltage criteria for LVH, may be normal variant ( R in aVL ) Cannot rule out Anterior infarct , age undetermined Abnormal ECG When compared with ECG of 22-Dec-2021 12:17, PREVIOUS ECG IS PRESENT when compared to prior, faster rate no STEMI Confirmed by Ginger Barefoot ([id number]) on 07/03/2024 1:33:46 PM  Radiology: MR BRAIN WO CONTRAST Result Date: 07/03/2024 EXAM: MRI BRAIN WITHOUT CONTRAST 07/03/2024 01:18:52 PM TECHNIQUE: Multiplanar multisequence MRI of the head/brain was performed without the administration of intravenous contrast. COMPARISON: CT head 10/31/2023. CLINICAL HISTORY: Stroke suspected (Ped 0-17y). Stroke suspected (Ped 0-17y). FINDINGS: BRAIN AND VENTRICLES: No acute infarct. Small remote infarct in the right cerebellum. Mild scattered foci of T2 and FLAIR hyperintensity in the periventricular and subcortical white matter with additional signal  abnormality in the pons suggestive of mild chronic microvascular ischemic changes. Cerebral volume within normal limits for patient's age. No intracranial hemorrhage. No mass. No midline shift. No hydrocephalus. The sella is unremarkable. Normal flow voids. ORBITS: Deformity of the right lamina papyracea. No acute abnormality of the visualized orbits. SINUSES AND MASTOIDS: No acute abnormality. BONES AND SOFT TISSUES: Normal marrow signal. No acute soft tissue abnormality. IMPRESSION: 1. No acute intracranial abnormality. 2. Small remote right cerebellar infarct. 3. Mild chronic microvascular ischemic changes. Electronically signed by: Donnice Mania MD 07/03/2024 01:37 PM EST RP Workstation: [id number]     Procedures   Medications Ordered in the ED  acetaminophen  (TYLENOL ) tablet 1,000 mg (1,000 mg Oral Given 07/03/24 1402)  Medical Decision Making Risk OTC drugs.   This patient is a 57 y.o. female  who presents to the ED for concern of headache, weakness.   Differential diagnoses prior to evaluation: The emergent differential diagnosis includes, but is not limited to, Stroke, increased ICP, meningitis, CVA, intracranial tumor, venous sinus thrombosis, migraine, cluster headache, hypertension, drug related, head injury, tension headache, sinusitis, dental abscess, otitis media, TMJ, spinal cord injury, ACS, arrhythmia, syncope, orthostatic hypotension, sepsis, hypoglycemia, hypoxia, electrolyte disturbance, endocrine disorder, anemia, environmental exposure, polypharmacy . This is not an exhaustive differential.   Past Medical History / Co-morbidities / Social History: hypertension, prediabetes, diverticulitis, tobacco use   Physical Exam: Physical exam performed. The pertinent findings include: Neurologic exam is significant for some right upper extremity subtle weakness, there is no decrease in sensation, she is able to perform finger-nose-finger better  on the left than she can on the right, cranial nerves III through XII are normal, gross visual acuity is normal, peripheral visual fields appear normal, bilateral lower extremities with normal strength and sensation   Patient is hypertensive, blood pressure initially 163/82, 155/90 on recheck.  Vital signs otherwise stable.  Lab Tests/Imaging studies: I personally interpreted labs/imaging and the pertinent results include: CBC unremarkable, CMP notable for mild hypokalemia, Tessman 3.3.  Mildly elevated blood glucose at 156.  Mild hypocalcemia, calcium 8.3.  MRI brain shows 1. No acute intracranial abnormality.  2. Small remote right cerebellar infarct.  3. Mild chronic microvascular ischemic changes.  . I agree with the radiologist interpretation.  Cardiac monitoring: EKG obtained and interpreted by myself and attending physician which shows: Normal sinus rhythm with PVCs, no acute ST ST changes noted   Medications: I ordered medication including Tylenol  for headache.  I have reviewed the patients home medicines and have made adjustments as needed.   3:00 PM Care of MIRIANA GAERTNER transferred to PA Tinnie Matter and Dr. Ellouise at the end of my shift as the patient will require reassessment once labs/imaging have resulted. Patient presentation, ED course, and plan of care discussed with review of all pertinent labs and imaging. Please see his/her note for further details regarding further ED course and disposition. Plan at time of handoff is probably TIA workup admit but pending neurology consult. This may be altered or completely changed at the discretion of the oncoming team pending results of further workup.   Final diagnoses:  None    ED Discharge Orders     None          Rosan Sherlean VEAR DEVONNA 07/03/24 1502    Tegeler, Lonni PARAS, MD 07/03/24 1531

## 2024-07-03 NOTE — ED Notes (Signed)
 Pt sitting up in bed, pt awake and oriented, pt states that she is ready to go home, read and reviewed d/c instructions, advised pt to return for any concerns or worsening symptoms. Pt ambulatory from department.

## 2024-07-03 NOTE — Discharge Instructions (Addendum)
 Thank you for letting us  evaluate you today.  Your labs are unremarkable.  Your MRI of your brain is negative for stroke.  I have contacted neurology who will call you to make an appointment within the next 2 weeks.  Please follow-up with them. Please continue taking blood pressure medications  Return to Emergency Department if you experience altered mentation, seizure-like activity, loss of consciousness, numbness or weakness in one-sided body, slurred speech, inability to walk, dizziness at rest, worsening symptoms

## 2024-08-25 ENCOUNTER — Encounter: Payer: Self-pay | Admitting: Internal Medicine
